# Patient Record
Sex: Male | Born: 1950
Health system: Southern US, Community
[De-identification: ages and names within clinical notes are randomized; demographics above are authoritative.]

## PROBLEM LIST (undated history)

## (undated) DIAGNOSIS — K922 Gastrointestinal hemorrhage, unspecified: Secondary | ICD-10-CM

## (undated) DIAGNOSIS — D126 Benign neoplasm of colon, unspecified: Secondary | ICD-10-CM

## (undated) DIAGNOSIS — K2270 Barrett's esophagus without dysplasia: Secondary | ICD-10-CM

## (undated) DIAGNOSIS — K5790 Diverticulosis of intestine, part unspecified, without perforation or abscess without bleeding: Secondary | ICD-10-CM

## (undated) DIAGNOSIS — K2210 Ulcer of esophagus without bleeding: Secondary | ICD-10-CM

## (undated) DIAGNOSIS — Z9049 Acquired absence of other specified parts of digestive tract: Secondary | ICD-10-CM

## (undated) DIAGNOSIS — K219 Gastro-esophageal reflux disease without esophagitis: Secondary | ICD-10-CM

## (undated) DIAGNOSIS — H269 Unspecified cataract: Secondary | ICD-10-CM

## (undated) DIAGNOSIS — Z5189 Encounter for other specified aftercare: Secondary | ICD-10-CM

## (undated) HISTORY — DX: Gastrointestinal hemorrhage, unspecified: K92.2

## (undated) HISTORY — DX: Gastro-esophageal reflux disease without esophagitis: K21.9

## (undated) HISTORY — DX: Diverticulosis of intestine, part unspecified, without perforation or abscess without bleeding: K57.90

## (undated) HISTORY — DX: Ulcer of esophagus without bleeding: K22.10

## (undated) HISTORY — DX: Barrett's esophagus without dysplasia: K22.70

## (undated) HISTORY — PX: HERNIA REPAIR: SHX51

## (undated) HISTORY — DX: Unspecified cataract: H26.9

## (undated) HISTORY — DX: Encounter for other specified aftercare: Z51.89

## (undated) HISTORY — DX: Acquired absence of other specified parts of digestive tract: Z90.49

## (undated) HISTORY — PX: COLONOSCOPY: SHX174

## (undated) HISTORY — PX: NISSEN FUNDOPLICATION: SHX2091

## (undated) HISTORY — PX: CHOLECYSTECTOMY: SHX55

## (undated) HISTORY — DX: Benign neoplasm of colon, unspecified: D12.6

---

## 1998-05-01 ENCOUNTER — Ambulatory Visit (HOSPITAL_COMMUNITY): Admission: RE | Admit: 1998-05-01 | Discharge: 1998-05-01 | Payer: Self-pay | Admitting: Gastroenterology

## 1999-05-02 ENCOUNTER — Encounter: Admission: RE | Admit: 1999-05-02 | Discharge: 1999-07-31 | Payer: Self-pay | Admitting: Internal Medicine

## 1999-08-06 ENCOUNTER — Encounter: Admission: RE | Admit: 1999-08-06 | Discharge: 1999-11-04 | Payer: Self-pay | Admitting: Internal Medicine

## 2004-10-06 ENCOUNTER — Ambulatory Visit: Payer: Self-pay | Admitting: Family Medicine

## 2005-05-02 ENCOUNTER — Ambulatory Visit: Payer: Self-pay | Admitting: Internal Medicine

## 2005-05-18 ENCOUNTER — Ambulatory Visit: Payer: Self-pay | Admitting: Family Medicine

## 2005-06-17 ENCOUNTER — Ambulatory Visit: Payer: Self-pay | Admitting: Gastroenterology

## 2005-07-16 ENCOUNTER — Ambulatory Visit: Payer: Self-pay | Admitting: Internal Medicine

## 2005-07-19 ENCOUNTER — Ambulatory Visit (HOSPITAL_COMMUNITY): Admission: RE | Admit: 2005-07-19 | Discharge: 2005-07-19 | Payer: Self-pay | Admitting: Surgery

## 2005-07-23 ENCOUNTER — Ambulatory Visit: Payer: Self-pay | Admitting: Internal Medicine

## 2005-08-27 ENCOUNTER — Inpatient Hospital Stay (HOSPITAL_COMMUNITY): Admission: RE | Admit: 2005-08-27 | Discharge: 2005-08-28 | Payer: Self-pay | Admitting: Surgery

## 2005-11-07 ENCOUNTER — Encounter: Admission: RE | Admit: 2005-11-07 | Discharge: 2005-11-07 | Payer: Self-pay | Admitting: Surgery

## 2006-01-24 ENCOUNTER — Ambulatory Visit: Payer: Self-pay | Admitting: Internal Medicine

## 2006-08-03 ENCOUNTER — Encounter: Admission: RE | Admit: 2006-08-03 | Discharge: 2006-08-03 | Payer: Self-pay | Admitting: *Deleted

## 2006-11-25 ENCOUNTER — Ambulatory Visit: Payer: Self-pay | Admitting: Gastroenterology

## 2006-12-09 ENCOUNTER — Ambulatory Visit: Payer: Self-pay | Admitting: Gastroenterology

## 2006-12-09 LAB — HM COLONOSCOPY

## 2007-04-09 ENCOUNTER — Ambulatory Visit: Payer: Self-pay | Admitting: Internal Medicine

## 2007-04-09 LAB — CONVERTED CEMR LAB
Eosinophils Absolute: 0.1 10*3/uL (ref 0.0–0.6)
HCT: 37.3 % — ABNORMAL LOW (ref 39.0–52.0)
MCHC: 34.9 g/dL (ref 30.0–36.0)
MCV: 93.9 fL (ref 78.0–100.0)
Monocytes Absolute: 1 10*3/uL — ABNORMAL HIGH (ref 0.2–0.7)
Monocytes Relative: 9.3 % (ref 3.0–11.0)
Neutro Abs: 5.5 10*3/uL (ref 1.4–7.7)
RBC: 3.98 M/uL — ABNORMAL LOW (ref 4.22–5.81)
RDW: 12.2 % (ref 11.5–14.6)
WBC: 10.5 10*3/uL (ref 4.5–10.5)

## 2007-04-10 ENCOUNTER — Ambulatory Visit: Payer: Self-pay | Admitting: Internal Medicine

## 2007-04-13 ENCOUNTER — Encounter: Payer: Self-pay | Admitting: Internal Medicine

## 2007-04-13 ENCOUNTER — Ambulatory Visit: Payer: Self-pay | Admitting: Internal Medicine

## 2007-04-16 ENCOUNTER — Ambulatory Visit (HOSPITAL_COMMUNITY): Admission: RE | Admit: 2007-04-16 | Discharge: 2007-04-16 | Payer: Self-pay | Admitting: Internal Medicine

## 2007-09-10 DIAGNOSIS — K5730 Diverticulosis of large intestine without perforation or abscess without bleeding: Secondary | ICD-10-CM | POA: Insufficient documentation

## 2007-09-10 DIAGNOSIS — D126 Benign neoplasm of colon, unspecified: Secondary | ICD-10-CM | POA: Insufficient documentation

## 2010-05-29 ENCOUNTER — Telehealth: Payer: Self-pay | Admitting: Internal Medicine

## 2010-06-19 ENCOUNTER — Encounter: Payer: Self-pay | Admitting: Internal Medicine

## 2010-06-19 ENCOUNTER — Ambulatory Visit: Payer: Self-pay | Admitting: Internal Medicine

## 2010-06-19 DIAGNOSIS — K449 Diaphragmatic hernia without obstruction or gangrene: Secondary | ICD-10-CM | POA: Insufficient documentation

## 2010-06-21 ENCOUNTER — Telehealth: Payer: Self-pay | Admitting: Internal Medicine

## 2010-06-21 LAB — CONVERTED CEMR LAB
ALT: 25 units/L (ref 0–53)
AST: 21 units/L (ref 0–37)
Albumin: 4.3 g/dL (ref 3.5–5.2)
Alkaline Phosphatase: 80 units/L (ref 39–117)
Basophils Absolute: 0 10*3/uL (ref 0.0–0.1)
Bilirubin, Direct: 0.1 mg/dL (ref 0.0–0.3)
CO2: 29 meq/L (ref 19–32)
Cholesterol: 186 mg/dL (ref 0–200)
Creatinine, Ser: 1.2 mg/dL (ref 0.4–1.5)
Glucose, Bld: 77 mg/dL (ref 70–99)
HCT: 48.5 % (ref 39.0–52.0)
HDL: 43.9 mg/dL (ref >39.00)
Hemoglobin: 16.6 g/dL (ref 13.0–17.0)
LDL Cholesterol: 118 mg/dL — ABNORMAL HIGH (ref 0–99)
Neutrophils Relative %: 60.1 % (ref 43.0–77.0)
PSA: 0.31 ng/mL (ref 0.10–4.00)
RDW: 13.2 % (ref 11.5–14.6)
TSH: 1.56 microintl units/mL (ref 0.35–5.50)
Total Bilirubin: 0.8 mg/dL (ref 0.3–1.2)
Total Protein: 7.4 g/dL (ref 6.0–8.3)
Triglycerides: 122 mg/dL (ref 0.0–149.0)
WBC: 7.2 10*3/uL (ref 4.5–10.5)

## 2010-07-01 DIAGNOSIS — Z5189 Encounter for other specified aftercare: Secondary | ICD-10-CM

## 2010-07-01 HISTORY — DX: Encounter for other specified aftercare: Z51.89

## 2010-07-31 NOTE — Progress Notes (Signed)
Summary: REQUEST FOR RE-EST / CPX  Phone Note Call from Patient   Caller: Patient    Summary of Call: Pt called to see if he could be re-est with Dr Cato Mulligan (last seen 07/2005) and come in for cpx/labs at time of re-est appt.Marland KitchenMarland KitchenMarland Kitchen?  Can you advise okay for same?  Pt can be reached at [phone].   Initial call taken by: Debbra Riding,  May 29, 2010 4:02 PM  Follow-up for Phone Call        ok no urgency Follow-up by: Birdie Sons MD,  May 30, 2010 10:21 AM  Additional Follow-up for Phone Call Additional follow up Details #1::        Called and left msg for pt to c/b so appt can be scheduled.  Additional Follow-up by: Debbra Riding,  May 31, 2010 8:54 AM    Additional Follow-up for Phone Call Additional follow up Details #2::    Pt c/b and was scheduled for appt on 12/20  at  9am.  Follow-up by: Debbra Riding,  May 31, 2010 9:03 AM

## 2010-08-02 NOTE — Assessment & Plan Note (Signed)
Summary: PT RE-EST (OK PER DR SWORDS) // RS   Vital Signs:  Patient profile:   60 year old male Height:      69 inches Weight:      188 pounds BMI:     27.86 Temp:     98.5 degrees F oral Pulse rate:   84 / minute BP sitting:   112 / 74  (left arm) Cuff size:   large  Vitals Entered By: Alfred Levins, CMA (June 19, 2010 9:17 AM) CC: re-establish   CC:  re-establish.  History of Present Illness: CPX  Current Problems (verified): 1)  Colonic Polyps  (ICD-211.3) 2)  Hiatal Hernia  (ICD-553.3) 3)  Diverticulosis, Colon  (ICD-562.10)  Current Medications (verified): 1)  Omeprazole 40 Mg Cpdr (Omeprazole) .Marland Kitchen.. 1 By Mouth Once Daily  Allergies (verified): No Known Drug Allergies  Past History:  Past Medical History: Last updated: 02/23/2007 Hiatal Hernia GERD  Family History: Last updated: 06/19/2010 Family History Depression Family History Hypertension Family History Lung cancer--mother pancreatitis---father  Social History: Last updated: 02/23/2007 Occupation: Married Never Smoked Alcohol use-yes Drug use-no  Risk Factors: Smoking Status: never (02/23/2007)  Past Surgical History:  nissen fundoplication  Family History: Family History Depression Family History Hypertension Family History Lung cancer--mother pancreatitis---father  Physical Exam  General:  well-developed well-nourished male in no acute distress. HEENT exam atraumatic, normocephalic symmetric her muscles are intact. Neck is supple without lymphadenopathy or thyromegaly. Chest clear to auscultation cardiac exam S1-S2 are regular. Abdominal exam active bowel sounds, soft nontender predictor his is no clubbing cyanosis or edema. Neurologic exam is alert with normal gait   Impression & Recommendations:  Problem # 1:  Preventive Health Care (ICD-V70.0) health maintenance up-to-date. I'll check an EKG and given [place] at the. Orders: Venipuncture (13086) TLB-Lipid Panel  (80061-LIPID) TLB-BMP (Basic Metabolic Panel-BMET) (80048-METABOL) TLB-CBC Platelet - w/Differential (85025-CBCD) TLB-Hepatic/Liver Function Pnl (80076-HEPATIC) TLB-TSH (Thyroid Stimulating Hormone) (84443-TSH) TLB-PSA (Prostate Specific Antigen) (84153-PSA)  Complete Medication List: 1)  Omeprazole 40 Mg Cpdr (Omeprazole) .Marland Kitchen.. 1 by mouth once daily   Orders Added: 1)  New Patient 40-64 years [99386] 2)  Venipuncture [57846] 3)  TLB-Lipid Panel [80061-LIPID] 4)  TLB-BMP (Basic Metabolic Panel-BMET) [80048-METABOL] 5)  TLB-CBC Platelet - w/Differential [85025-CBCD] 6)  TLB-Hepatic/Liver Function Pnl [80076-HEPATIC] 7)  TLB-TSH (Thyroid Stimulating Hormone) [84443-TSH] 8)  TLB-PSA (Prostate Specific Antigen) [96295-MWU]    Preventive Care Screening  Colonoscopy:    Date:  07/01/2004    Next Due:  07/2014    Results:  Diverticulosis     Preventive Care Screening  Colonoscopy:    Date:  07/01/2004    Next Due:  07/2014    Results:  Diverticulosis

## 2010-08-02 NOTE — Progress Notes (Signed)
Summary: referral  Phone Note Call from Patient Call back at Home Phone [phone]   Caller: Patient Call For: swords Summary of Call: pt wants to know what orthopedic you would recommend for his arthritis Initial call taken by: Alfred Levins, CMA,  June 21, 2010 9:27 AM  Follow-up for Phone Call        dr Chapman Fitch ortho Follow-up by: Birdie Sons MD,  June 21, 2010 2:22 PM  Additional Follow-up for Phone Call Additional follow up Details #1::        pt aware Additional Follow-up by: Alfred Levins, CMA,  June 21, 2010 2:56 PM

## 2010-11-13 NOTE — Assessment & Plan Note (Signed)
[facility]                         GASTROENTEROLOGY OFFICE NOTE   RUTHERFORD, ALARIE                   MRN:          [id number]  DATE:04/10/2007                            DOB:          [birth year]    CHIEF COMPLAINT:  Melena, dark stools.   HISTORY:  [name] has been a patient of Dr. Victorino Dike.  He has  had several days of very dark, jet black, navy blue foul-smelling  stools.  That may be abating somewhat.  There is no real abdominal pain.  No light-headedness.  No chest pain.  No dizziness.  He had a  hemoglobin, i.e., CBC, drawn yesterday and his hemoglobin is normal at  13.  He has not really been using any antiinflammatories, and he is  essentially on no medicines at this time.  He has been taking some  Zegerid powder occasionally after his laparoscopic Nissen fundoplication  by Dr. Daphine Deutscher last year.  He does not have classic heartburn any more,  but occasionally has some symptoms associated with what he describes as  acid and some mild epigastric distress.  He is otherwise fairly healthy.  He has had a colonoscopy in the past.  I do not think he has had any  polyps there.  Records are not completely available at the time of the  dictation, though I had them when he was in the office.   MEDICATIONS:  None.   PHYSICAL EXAM:  Weight 184 pounds, pulse 88, blood pressure 110/64.  ABDOMEN:  Soft and nontender.  Bowel sounds present.  HEART:  S1, S2.  No murmurs, rubs, or gallops.  LUNGS:  Clear.  RECTAL:  Shows dark brown stool, which is markedly Hemoccult positive.   ASSESSMENT:  Melena.  Presumably, this is an upper GI bleed though right  colonic bleed is possible.  He did have some diverticulosis on his  colonoscopy.  Otherwise, I think this is most likely an upper GI bleed.   PLAN:  1. Zegerid 40 mg b.i.d.  2. Schedule EGD.  This will be done Monday, 3 days from now.  3. Should he have persistent melena, hematochezia,  light-headedness,      worsening symptoms, he should call the on call physician, or go to      the ER depending upon the situation.  This was explained to the      patient.     Iva Boop, MD,FACG  Electronically Signed   CEG/MedQ  DD: 04/10/2007  DT: 04/11/2007  Job #: [id number]   cc:   Valetta Mole. Swords, MD  Thornton Park Daphine Deutscher, MD

## 2010-11-16 NOTE — Op Note (Signed)
NAME:  [name], [name]            ACCOUNT NO.:  [id number]   MEDICAL RECORD NO.:  [id number]          PATIENT TYPE:  INP   LOCATION:  1603                         FACILITY:  [facility]   PHYSICIAN:  Thornton Park. Daphine Deutscher, MD  DATE OF BIRTH:  07/28/1950   DATE OF PROCEDURE:  08/26/2005  DATE OF DISCHARGE:                                 OPERATIVE REPORT   CCS#:  [id number].   PREOPERATIVE DIAGNOSIS:  Gastroesophageal reflux disease with type 1 sliding  hiatal hernia by upper GI.   POSTOPERATIVE DIAGNOSIS:  Gastroesophageal reflux disease with type 1  sliding hiatal hernia by upper GI with evidence of chronic inflammatory  changes in the gastroesophageal junction.   PROCEDURES:  Laparoscopic Nissen fundoplication over a #56 lighted bougie  with a two suture pledgeted closure of the posterior hiatus and a free  suture wrap using the Endostitch.   SURGEON:  Thornton Park. Daphine Deutscher, MD.   ASSISTANT:  Baruch Merl, MD.   ANESTHESIA:  General endotracheal.   DRAINS:  None.   DISPOSITION:  To PACU and the floor.   DESCRIPTION OF PROCEDURE:  [name] is a 60 year old white male with  above-mentioned diagnosis. He was taken to room 1 and given general  anesthesia. The abdomen was prepped with chlorhexidine and draped sterilely.  The abdomen was entered using an Optiview trocar to the left upper quadrant  and once entering the abdomen it was insufflated without difficulty. The  trocar went in nicely without difficulty either. A 5 mm was placed in the  right side in the upper abdomen and lower than that was at 10/11 and then a  10/11 to the left of the midline at the umbilicus and another 5 lateral and  then a 5 in the upper midline for the [facility] retractor. The Satira Mccallum was  inserted and the liver was retracted.   Dissection began along the gastrohepatic membrane and was incised exposing  the right crus and then carried anteriorly exposing the pharyngoesophageal  ligament which was  taken down. I carried this over to the left crus.   I then started down on the greater curvature and took down short gastric's  with the harmonic scalpel. These were divided and carried up to the left  crus were I completed the dissection and found a significant hiatal hernia  and a lot of inflammatory changes on the left side. It appeared that the  entire GE junction was telescoped above the diaphragm. Once I dissected this  free, I got behind it and put a Penrose drain around the distal  esophagogastric junction.   I then went back to the patient's right side and again dissected free the  esophagogastric junction better. I closed the hiatus with two pledgeted  sutures using the Endostitch bringing it together nicely. Dr. Colin Benton  endoscoped the patient in the meantime, and we verified the esophagogastric  junction and I had plenty of esophagus in the abdomen. I then brought around  the upper portion of the stomach and then with a lighted 56 bougie in place  I did the fundoplication suturing all three sutures getting a purchase  of  esophagus. When completed, the bougie was withdrawn. A healthy wrap was  present. The clips were placed on the upper two most knots for subsequent  endoscopic visualization. There was no bleeding noted. I irrigated and  inspected and it looked good. The abdomen was deflated and the trocars  removed. The wounds were injected with 0.5% Marcaine. The skin was closed  with 4-0 Vicryl subcuticularly and Benzoin and Steri-Strips. The patient  seemed to tolerate the procedure well and was taken to the recovery room  satisfactory condition.      Thornton Park Daphine Deutscher, MD  Electronically Signed     MBM/MEDQ  D:  08/26/2005  T:  08/27/2005  Job:  [id number]   cc:   Valetta Mole. Swords, M.D. [facility]  [address] [place]  [place] [address]   Ulyess Mort, M.D. [facility]  [address]  [place]  [place] [address]

## 2010-11-16 NOTE — Op Note (Signed)
NAME:  HOYTE, ZIEBELL            ACCOUNT NO.:  [id number]   MEDICAL RECORD NO.:  [id number]          PATIENT TYPE:  INP   LOCATION:  1603                         FACILITY:  [facility]   PHYSICIAN:  Alfonse Ras, MD   DATE OF BIRTH:  [birth year]   DATE OF PROCEDURE:  08/26/2005  DATE OF DISCHARGE:                                 OPERATIVE REPORT   PREOPERATIVE DIAGNOSIS:  Gastroesophageal reflux disease.   POSTOPERATIVE DIAGNOSIS:  Gastroesophageal reflux disease.   PROCEDURE:  Intraoperative upper endoscopy.   SURGEON:  Alfonse Ras, M.D.  (The remainder of note will be dictated by Wenda Low, M.D.)   DESCRIPTION OF PROCEDURE:  At completion of the dissection of the hiatal  hernia, the Olympus endoscope was introduced through the oropharynx and down  through the esophagus under direct vision. The EG junction was easily  visualized at 43 cm, and the stomach was entered.  This was below the  diaphragm.  The endoscope was then removed, and the stomach was  decompressed. The remainder of the note will be dictated by Dr. Daphine Deutscher.      Alfonse Ras, MD  Electronically Signed     KRE/MEDQ  D:  08/26/2005  T:  08/27/2005  Job:  [id number]

## 2010-11-16 NOTE — Discharge Summary (Signed)
NAME:  JACK, BOLIO            ACCOUNT NO.:  [id number]   MEDICAL RECORD NO.:  [id number]          PATIENT TYPE:  INP   LOCATION:  1603                         [facility]ACILITY:  [facility]   PHYSICIAN:  Thornton Park. Daphine Deutscher, MD  DATE OF BIRTH:  [birth year]   DATE OF ADMISSION:  08/26/2005  DATE OF DISCHARGE:  08/28/2005                                 DISCHARGE SUMMARY   ADMISSION DIAGNOSES:  Gastroesophageal reflux disease.   DISCHARGE DIAGNOSES:  Gastroesophageal reflux disease, status post  laparoscopic Nissen fundoplication over a #56 lighted bougie with a two  suture pledgeted closure of the hiatus and a three suture wrap.   HOSPITAL COURSE:  Mr. Mizuno was an a.m. admission on August 26, 2005  and underwent the above-mentioned operation.  On postop day #1, he had an  upper GI series, which I reviewed, which showed appropriate repair of the  hernia with narrowing at the site of the wrap with no evidence of  extravasation.  Seemed to get along well and was not having really any pain  to speak of.  At the time of discharge on August 28, 2005, he was given a  prescription for Lortab elixir to take for pain and will be followed up in  the office in 2-3 weeks.   CONDITION:  Good.      Thornton Park Daphine Deutscher, MD  Electronically Signed     MBM/MEDQ  D:  08/28/2005  T:  08/28/2005  Job:  [id number]

## 2011-01-18 ENCOUNTER — Inpatient Hospital Stay (HOSPITAL_COMMUNITY)
Admission: EM | Admit: 2011-01-18 | Discharge: 2011-01-25 | DRG: 170 | Disposition: A | Discharge status: Home / Self Care | Payer: BC Managed Care – PPO | Attending: General Surgery | Admitting: General Surgery

## 2011-01-18 ENCOUNTER — Telehealth: Payer: Self-pay | Admitting: Internal Medicine

## 2011-01-18 ENCOUNTER — Encounter (HOSPITAL_COMMUNITY): Payer: Self-pay | Admitting: Radiology

## 2011-01-18 ENCOUNTER — Inpatient Hospital Stay (INDEPENDENT_AMBULATORY_CARE_PROVIDER_SITE_OTHER)
Admission: RE | Admit: 2011-01-18 | Discharge: 2011-01-18 | Disposition: A | Discharge status: Home / Self Care | Payer: BC Managed Care – PPO | Source: Ambulatory Visit | Attending: Emergency Medicine | Admitting: Emergency Medicine

## 2011-01-18 ENCOUNTER — Emergency Department (HOSPITAL_COMMUNITY): Payer: BC Managed Care – PPO

## 2011-01-18 DIAGNOSIS — R112 Nausea with vomiting, unspecified: Secondary | ICD-10-CM

## 2011-01-18 DIAGNOSIS — K219 Gastro-esophageal reflux disease without esophagitis: Principal | ICD-10-CM | POA: Diagnosis present

## 2011-01-18 DIAGNOSIS — Y836 Removal of other organ (partial) (total) as the cause of abnormal reaction of the patient, or of later complication, without mention of misadventure at the time of the procedure: Secondary | ICD-10-CM | POA: Diagnosis present

## 2011-01-18 DIAGNOSIS — K838 Other specified diseases of biliary tract: Secondary | ICD-10-CM | POA: Diagnosis present

## 2011-01-18 DIAGNOSIS — R1011 Right upper quadrant pain: Secondary | ICD-10-CM

## 2011-01-18 DIAGNOSIS — K8010 Calculus of gallbladder with chronic cholecystitis without obstruction: Secondary | ICD-10-CM | POA: Diagnosis present

## 2011-01-18 DIAGNOSIS — K929 Disease of digestive system, unspecified: Secondary | ICD-10-CM | POA: Diagnosis present

## 2011-01-18 DIAGNOSIS — R10811 Right upper quadrant abdominal tenderness: Secondary | ICD-10-CM

## 2011-01-18 DIAGNOSIS — K8020 Calculus of gallbladder without cholecystitis without obstruction: Secondary | ICD-10-CM

## 2011-01-18 DIAGNOSIS — Y9210 Unspecified residential institution as the place of occurrence of the external cause: Secondary | ICD-10-CM | POA: Diagnosis present

## 2011-01-18 DIAGNOSIS — K8000 Calculus of gallbladder with acute cholecystitis without obstruction: Secondary | ICD-10-CM | POA: Diagnosis present

## 2011-01-18 LAB — URINALYSIS, ROUTINE W REFLEX MICROSCOPIC
Glucose, UA: NEGATIVE mg/dL
Ketones, ur: NEGATIVE mg/dL
Leukocytes, UA: NEGATIVE
Nitrite: NEGATIVE
Protein, ur: NEGATIVE mg/dL

## 2011-01-18 LAB — DIFFERENTIAL
Basophils Absolute: 0.1 10*3/uL (ref 0.0–0.1)
Eosinophils Relative: 1 % (ref 0–5)
Lymphocytes Relative: 17 % (ref 12–46)
Lymphs Abs: 1.9 10*3/uL (ref 0.7–4.0)
Monocytes Relative: 9 % (ref 3–12)

## 2011-01-18 LAB — CBC
Hemoglobin: 16.6 g/dL (ref 13.0–17.0)
MCV: 89.2 fL (ref 78.0–100.0)
RDW: 12.4 % (ref 11.5–15.5)

## 2011-01-18 LAB — COMPREHENSIVE METABOLIC PANEL
ALT: 21 U/L (ref 0–53)
AST: 16 U/L (ref 0–37)
Alkaline Phosphatase: 116 U/L (ref 39–117)
Calcium: 9.2 mg/dL (ref 8.4–10.5)
Creatinine, Ser: 1.02 mg/dL (ref 0.50–1.35)
GFR calc Af Amer: >60 mL/min (ref >60)
GFR calc non Af Amer: >60 mL/min (ref >60)
Sodium: 135 mEq/L (ref 135–145)
Total Bilirubin: 0.4 mg/dL (ref 0.3–1.2)
Total Protein: 7.6 g/dL (ref 6.0–8.3)

## 2011-01-18 MED ORDER — IOHEXOL 300 MG/ML  SOLN
100.0000 mL | Freq: Once | INTRAMUSCULAR | Status: AC | PRN
  Administered 2011-01-18: 100 mL via INTRAVENOUS

## 2011-01-18 NOTE — Telephone Encounter (Signed)
Pt would like to be seen today for abdominal pain. Pt informed there are no appts available today, suggested he contact his PCP for today. Offered pt an appt for Tuesday with Dr. Leone Payor but pt states he would like to switch his care to Dr. Christella Hartigan, his wife sees Dr. Christella Hartigan and they would like to see the same physician. Dr. Leone Payor please let me know if you approve this switch.

## 2011-01-18 NOTE — Telephone Encounter (Signed)
Dr. Christella Hartigan will you accept this pt? He states his wife sees you and he would like to see you asap if you will. Please advise.

## 2011-01-18 NOTE — Telephone Encounter (Signed)
Ok with me 

## 2011-01-19 LAB — CBC
MCH: 32.6 pg (ref 26.0–34.0)
Platelets: 252 10*3/uL (ref 150–400)
WBC: 13.1 10*3/uL — ABNORMAL HIGH (ref 4.0–10.5)

## 2011-01-19 LAB — COMPREHENSIVE METABOLIC PANEL
Alkaline Phosphatase: 108 U/L (ref 39–117)
BUN: 11 mg/dL (ref 6–23)
Calcium: 8.3 mg/dL — ABNORMAL LOW (ref 8.4–10.5)
GFR calc Af Amer: >60 mL/min (ref >60)
GFR calc non Af Amer: >60 mL/min (ref >60)
Potassium: 4.6 mEq/L (ref 3.5–5.1)
Total Bilirubin: 0.9 mg/dL (ref 0.3–1.2)

## 2011-01-20 ENCOUNTER — Other Ambulatory Visit (INDEPENDENT_AMBULATORY_CARE_PROVIDER_SITE_OTHER): Payer: Self-pay | Admitting: Surgery

## 2011-01-20 ENCOUNTER — Observation Stay (HOSPITAL_COMMUNITY): Payer: BC Managed Care – PPO

## 2011-01-20 DIAGNOSIS — K8000 Calculus of gallbladder with acute cholecystitis without obstruction: Secondary | ICD-10-CM

## 2011-01-20 LAB — CBC
MCH: 32.6 pg (ref 26.0–34.0)
MCHC: 35.9 g/dL (ref 30.0–36.0)
MCV: 90.9 fL (ref 78.0–100.0)
Platelets: 238 10*3/uL (ref 150–400)
RBC: 4.6 MIL/uL (ref 4.22–5.81)
WBC: 13.5 10*3/uL — ABNORMAL HIGH (ref 4.0–10.5)

## 2011-01-20 LAB — COMPREHENSIVE METABOLIC PANEL
AST: 36 U/L (ref 0–37)
Albumin: 3 g/dL — ABNORMAL LOW (ref 3.5–5.2)
BUN: 8 mg/dL (ref 6–23)
CO2: 27 mEq/L (ref 19–32)
Chloride: 102 mEq/L (ref 96–112)
Creatinine, Ser: 0.84 mg/dL (ref 0.50–1.35)
Glucose, Bld: 118 mg/dL — ABNORMAL HIGH (ref 70–99)
Total Bilirubin: 0.8 mg/dL (ref 0.3–1.2)
Total Protein: 6.4 g/dL (ref 6.0–8.3)

## 2011-01-20 LAB — MRSA PCR SCREENING: MRSA by PCR: NEGATIVE

## 2011-01-20 NOTE — Telephone Encounter (Signed)
Happy to see him at my next available NGI appt.

## 2011-01-21 LAB — COMPREHENSIVE METABOLIC PANEL
AST: 31 U/L (ref 0–37)
BUN: 8 mg/dL (ref 6–23)
CO2: 26 mEq/L (ref 19–32)
Calcium: 8.5 mg/dL (ref 8.4–10.5)
Creatinine, Ser: 0.8 mg/dL (ref 0.50–1.35)
GFR calc non Af Amer: >60 mL/min (ref >60)

## 2011-01-21 LAB — CBC
MCH: 31.9 pg (ref 26.0–34.0)
MCV: 90.6 fL (ref 78.0–100.0)
Platelets: 253 10*3/uL (ref 150–400)
RBC: 4.26 MIL/uL (ref 4.22–5.81)
RDW: 12.5 % (ref 11.5–15.5)

## 2011-01-21 NOTE — Telephone Encounter (Signed)
Appt made for pt to see Dr. Sebastian Ache 02/22/11@3 :30pm.

## 2011-01-22 NOTE — H&P (Signed)
  [name], [name] NO.:  [id number]  MEDICAL RECORD NO.:  [id number]  LOCATION:  5034                         FACILITY:  [facility]  PHYSICIAN:  Wilmon Arms. Corliss Skains, M.D. DATE OF BIRTH:  Jul 28, 1950  DATE OF ADMISSION:  01/18/2011 DATE OF DISCHARGE:                             HISTORY & PHYSICAL   CHIEF COMPLAINT:  Right upper quadrant abdominal pain.  HISTORY OF PRESENT ILLNESS:  This is a 60 year old male with a history of hiatal hernia, status post previous laparoscopic Nissen fundoplication by Dr. Luretha Murphy in 2007, who presents with several episodes of epigastric abdominal pain over the last week.  He states that these episodes tend to happen at night.  He has a sharp pain in his epigastrium wrapping around to his back.  He does have some nausea associated with this, but no vomiting.  He denies any fever.  Initially, the symptoms resolved quickly, but over the last couple of nights, the symptoms have lasted much longer have become fairly severe.  He does relate the first episode to eating pork chops.  He denies any change in his bowel habits.  PAST MEDICAL HISTORY:  Gastroesophageal reflux disease and history of hiatal hernia.  PAST SURGICAL HISTORY:  Laparoscopic Nissen fundoplication in 2007 Dr. Luretha Murphy.  FAMILY HISTORY:  His father is deceased from pancreatitis.  SOCIAL HISTORY:  Nonsmoker, nondrinker.  REVIEW OF SYSTEMS:  Otherwise, negative.  CURRENT MEDICATIONS:  The patient takes Prilosec 40 mg daily.  ALLERGIES:  None.  PHYSICAL EXAMINATION:  VITAL SIGNS:  Temperature 97.4, heart rate 87, blood pressure 129/88, respirations 18, saturation 97% on room air. GENERAL:  This is a well-developed, well-nourished male, who appears mildly uncomfortable secondary to nausea.  He has just received some pain medication. HEENT:  EOMI.  Sclerae anicteric. NECK:  No masses, no thyromegaly. LUNGS:  Clear, normal respiratory effort. HEART:   Regular rate and rhythm, no murmur. ABDOMEN:  Positive bowel sounds, soft, tender in his epigastrium and right upper quadrant wrapped around in his back.  No palpable masses. He has a small reducible umbilical hernia. EXTREMITIES:  No edema. SKIN:  Warm, dry.  No sign of jaundice.  LABORATORY DATA:  White count 11.5, hemoglobin 16.6, platelet count 277. Electrolytes within normal limits.  Total bilirubin 0.4.  AST and ALT are normal.  Lipase 34.  Urinalysis is negative.  CT scan the abdomen and pelvis shows cholelithiasis with possible wall thickening, moderate- sized hiatal hernia, small right inguinal hernia containing only fat and a small umbilical hernia containing only fat.  Right upper quadrant ultrasound showed multiple 1-cm gallstones with some mild gallbladder thickening.  No pericholecystic fluid.  A normal- appearing common bile duct.  IMPRESSION:  Acute cholecystitis with no sign of choledocholithiasis or pancreatitis.  PLAN:  We will admit the patient for IV hydration, bowel rest, and IV antibiotics.  We will plan laparoscopic cholecystectomy with intraoperative cholangiogram in this admission.     Wilmon Arms. Corliss Skains, M.D.     MKT/MEDQ  D:  01/18/2011  T:  01/19/2011  Job:  [id number]  Electronically Signed by Manus Rudd M.D. on 01/22/2011 08:10:48 AM

## 2011-01-23 ENCOUNTER — Inpatient Hospital Stay (HOSPITAL_COMMUNITY): Payer: BC Managed Care – PPO

## 2011-01-23 LAB — COMPREHENSIVE METABOLIC PANEL
AST: 24 U/L (ref 0–37)
Albumin: 2.9 g/dL — ABNORMAL LOW (ref 3.5–5.2)
Calcium: 9 mg/dL (ref 8.4–10.5)
Creatinine, Ser: 0.93 mg/dL (ref 0.50–1.35)
Sodium: 136 mEq/L (ref 135–145)
Total Protein: 6.6 g/dL (ref 6.0–8.3)

## 2011-01-23 LAB — DIFFERENTIAL
Basophils Absolute: 0.1 10*3/uL (ref 0.0–0.1)
Lymphocytes Relative: 25 % (ref 12–46)
Monocytes Absolute: 1 10*3/uL (ref 0.1–1.0)
Neutro Abs: 5.3 10*3/uL (ref 1.7–7.7)
Neutrophils Relative %: 59 % (ref 43–77)

## 2011-01-23 LAB — CBC
HCT: 41.3 % (ref 39.0–52.0)
Hemoglobin: 14.7 g/dL (ref 13.0–17.0)
MCHC: 35.6 g/dL (ref 30.0–36.0)
RBC: 4.59 MIL/uL (ref 4.22–5.81)
WBC: 8.9 10*3/uL (ref 4.0–10.5)

## 2011-01-23 NOTE — Telephone Encounter (Signed)
Letter mailed to the pt with appt date and time.  

## 2011-01-24 DIAGNOSIS — R1011 Right upper quadrant pain: Secondary | ICD-10-CM

## 2011-01-24 DIAGNOSIS — K832 Perforation of bile duct: Secondary | ICD-10-CM

## 2011-01-25 LAB — CBC
HCT: 44.3 % (ref 39.0–52.0)
Hemoglobin: 15.9 g/dL (ref 13.0–17.0)
MCH: 31.8 pg (ref 26.0–34.0)
MCV: 88.6 fL (ref 78.0–100.0)
RBC: 5 MIL/uL (ref 4.22–5.81)

## 2011-01-25 LAB — COMPREHENSIVE METABOLIC PANEL
ALT: 83 U/L — ABNORMAL HIGH (ref 0–53)
BUN: 8 mg/dL (ref 6–23)
CO2: 22 mEq/L (ref 19–32)
Calcium: 9.1 mg/dL (ref 8.4–10.5)
GFR calc Af Amer: >60 mL/min (ref >60)
GFR calc non Af Amer: >60 mL/min (ref >60)
Glucose, Bld: 93 mg/dL (ref 70–99)
Sodium: 136 mEq/L (ref 135–145)

## 2011-01-30 ENCOUNTER — Encounter (INDEPENDENT_AMBULATORY_CARE_PROVIDER_SITE_OTHER): Payer: Self-pay | Admitting: General Surgery

## 2011-01-31 ENCOUNTER — Telehealth: Payer: Self-pay

## 2011-01-31 DIAGNOSIS — T85590A Other mechanical complication of bile duct prosthesis, initial encounter: Secondary | ICD-10-CM

## 2011-01-31 NOTE — Telephone Encounter (Signed)
Patient requests to remain a patient of Dr Russella Dar  There is an earlier phone note where the patient had called and asked to change to Dr Christella Hartigan from Dr Leone Payor, the patient was then admitted to the hospital and had a procedure with Dr Russella Dar he has asked to remain his patient and he never saw Dr Christella Hartigan.     Per Dr Russella Dar patient to be scheduled for ERCP with stent removal.  Patient is scheduled for 03/06/11 12:30 at [facility]  Unable to schedule with MAC sedation, per Dr Russella Dar ok for conscious sedation. I have left a message for the patient to call back to review procedure date and time.  Patient will need a KUB to confirm stent placement 1 week prior to ERCP

## 2011-01-31 NOTE — Op Note (Signed)
NAME:  ORIE, BAXENDALE            ACCOUNT NO.:  [id number]  MEDICAL RECORD NO.:  [id number]  LOCATION:                                 FACILITY:  PHYSICIAN:  Thornton Park. Daphine Deutscher, MD  DATE OF BIRTH:  01/25/1951  DATE OF PROCEDURE:  01/20/2011 DATE OF DISCHARGE:                              OPERATIVE REPORT   PREOPERATIVE DIAGNOSIS:  Acute cholecystitis.  POSTOPERATIVE DIAGNOSIS:  Acute and subacute and chronic cholecystitis, cholelithiasis.  PROCEDURE:  Laparoscopic cholecystectomy, attempted intraoperative cholangiogram.  SURGEON:  Thornton Park. Daphine Deutscher, MD  ASSISTANTS: 1. Lodema Pilot, MD 2. Wilmon Arms. Tsuei, MD  DESCRIPTION OF PROCEDURE:  On Sunday, January 20, 2011, we took Taite Schoeppner to the OR 17 at [facility] side [facility].  He came in late Friday evening with acute cholecystitis and he was given IV antibiotics in an attempt to cool him off a bit.  He was taken to room 17, given general anesthesia.  The abdomen was prepped with chlorhexidine and draped sterilely.  Access to the abdomen was achieved through the right upper quadrant using a 5-mm 0-degrees OptiVu without difficulty.  The abdomen was insufflated and another port was placed to the left of the umbilicus again using one of the old other trocar incisions.  I put the camera down there and looked with the other trocar, we lifted up the left lateral side and we could see where the Nissen fundoplication had been done, but I could not see the diaphragm well but I could not see any hernias that were obvious.  However, his gallbladder was well walled off and it was certainly longer than a 66-day-old gallbladder.  I went ahead and was able to free up the fundus and then grasp it and then I decompressed it.  It contained some white bile.  I then stripped away all the adhesions, carrying this down to what appeared to be an obstructed infundibulum.  I could feel a stone and that I milked it back up into the main  gallbladder.  I had a tedious two-hour dissection to try to identify the cholecystocolic common duct junction.  I found the funnel taper and got around the structure and visualized a vessel. There was a very large prominent vessel that went up over the course of the gallbladder on the medial side, we went all the way up near the fundus that was quite large.  I went ahead and attempted to do a cholangiogram and began filling this distal structure that I was around and the contrast would not flow into the common duct.  I felt this was the funnel of the gallbladder.  After I went up and stripped this away and could see the vessels, I felt that I had a critical view of sorts although the anatomy was very inflamed and thick walled.  I went ahead used an Endo-GIA blue load and stapled across the distal gallbladder where I had gotten around it and then I took the gallbladder out from bottom up using clips on the vessel posteriorly that I had previously identified as a vessel and worked my way up with hook electrocautery. As it went up, it was apparent the gallbladder was very  thick walled. Eventually, we detached it and put in a bag and brought it out through the umbilicus in pieces.  This was not the umbilicus but the 12-mm trocar obliquely placed in the upper midline.  I went down and reinspected the gallbladder bed.  No active bleeding or bile leaks were noted.  I went ahead and placed a 19 Blake drain in the gallbladder bed, brought it out through the lateral 5-mm port.  I injected all the port sites with 0.25% Marcaine.  We visualized once again and there were no injuries noted to any other surrounding viscera.  The abdomen was deflated and the ports were withdrawn.  The skin incisions were closed with 4-0 Vicryl with Benzoin and Steri-Strips.  The patient tolerated the procedure well and was taken to recovery room in satisfactory condition.     Thornton Park Daphine Deutscher, MD     MBM/MEDQ   D:  01/20/2011  T:  01/21/2011  Job:  [id number]  Electronically Signed by Luretha Murphy MD on 01/31/2011 07:25:22 AM

## 2011-02-01 ENCOUNTER — Encounter (INDEPENDENT_AMBULATORY_CARE_PROVIDER_SITE_OTHER): Payer: Self-pay | Admitting: Surgery

## 2011-02-01 ENCOUNTER — Ambulatory Visit (INDEPENDENT_AMBULATORY_CARE_PROVIDER_SITE_OTHER): Payer: BC Managed Care – PPO | Admitting: Surgery

## 2011-02-01 VITALS — BP 112/72 | HR 70 | Ht 70.0 in | Wt 180.4 lb

## 2011-02-01 DIAGNOSIS — K838 Other specified diseases of biliary tract: Secondary | ICD-10-CM

## 2011-02-01 DIAGNOSIS — K929 Disease of digestive system, unspecified: Secondary | ICD-10-CM

## 2011-02-01 DIAGNOSIS — K449 Diaphragmatic hernia without obstruction or gangrene: Secondary | ICD-10-CM

## 2011-02-01 MED ORDER — PANTOPRAZOLE SODIUM 20 MG PO TBEC: 20.0000 mg | DELAYED_RELEASE_TABLET | Freq: Every day | ORAL | Status: DC

## 2011-02-01 NOTE — Telephone Encounter (Signed)
Patient advised of ERCP appt and instructions he is also advised to come in for KUB the week of 8/27-8/31.  He will call back for any further questions or concerns

## 2011-02-01 NOTE — Progress Notes (Signed)
Mr. Maniaci is one week out from his discharge at [facility] where he underwent a rotten cholecystectomy on Sunday, July 22 complicated by a cystic duct leak that was treated by ERCP stenting by Claudette Head.  His JP drainage has all but quit except for his irrigation.  I removed the drain.  Rx for Protonix 20 mg QD given for his recurrent GER symptoms.  Will see him back in 3 weeks

## 2011-02-01 NOTE — Telephone Encounter (Signed)
Left message for patient to call back  

## 2011-02-01 NOTE — Patient Instructions (Signed)
Redress the drain site as needed with Neosporin.   Call for increasing right upper quadrant pain.

## 2011-02-04 NOTE — Discharge Summary (Signed)
NAMEABHIJOT, [name] NO.:  [id number]  MEDICAL RECORD NO.:  [id number]  LOCATION:  5034                         FACILITY:  [facility]  PHYSICIAN:  Gabrielle Dare. Janee Morn, M.D.DATE OF BIRTH:  10/08/1950  DATE OF ADMISSION:  01/18/2011 DATE OF DISCHARGE:  01/25/2011                              DISCHARGE SUMMARY   ADMISSION DIAGNOSES:  Acute cholecystitis, cholelithiasis, no choledocholithiasis or pancreatitis.  DISCHARGING DIAGNOSES: 1. Acute cholecystitis, cholelithiasis. 2. Postoperative bile leak after cholecystectomy. 3. History of hiatal hernia with residual moderate-sized hernia status     post Nissen fundoplication, Dr. Luretha Murphy in 2007. 4. Gastroesophageal reflux disease.  PROCEDURES: 1. CT of the abdomen and pelvis, July 2012 shows cholelithiasis and     possible changes, acute cholecystitis. 2. Abdominal ultrasound shows multiple small gallstones up to 1 cm in     size.  The gallbladder wall thickened at 5 mm. 3. Laparoscopic cholecystectomy and intraoperative cholangiogram, January 20, 2011. 4. ERCP with stent placement, January 23, 2011, Dr. Claudette Head.  BRIEF HISTORY:  The patient is a 60 year old gentleman with history of hiatal hernia and prior Nissen laparoscopic fundoplication by Dr. Daphine [name] in 2007.  He has had several episodes of abdominal pain over the last week.  It happens mostly at night.  He describes it as a sharp, wrapping around his back.  He underwent evaluation in the ER at [facility] where his white count was 11,500, hemoglobin was 16.6. Electrolytes and LFTs were normal.  CT showed cholelithiasis with possible thickening and a moderate-sized hiatal hernia along with a small right inguinal hernia containing fat only, and a small umbilical hernia containing fat only.  He was seen by Dr. Manus Rudd and was admitted for IV hydration, bowel rest, and IV antibiotics.  HOSPITAL COURSE:  The patient was admitted, placed  on IV antibiotics, and bowel rest.  He showed some improvement.  He requested Dr. Luretha Murphy to do the surgery.  Dr. Daphine [name] was on the weekend and he was taken to the OR on January 20, 2011 and underwent laparoscopic cholecystectomy.  On the first postoperative morning, he had some pain which was his primary issue along with.  Now, he has been having a good deal of drainage through his JP, it is 250, it was recorded the day before total of 290 overnight.  He was seen by Dr. Daphine [name], who was concerned that there might be a bile leak.  On July 24, second postoperative day, he had a total of 365 mL through his drain, yesterday another 125 and midnight 6 a.m. shift.  He was seen by Dr. Daphine [name] and his opinion is that we need to watch him closely and recheck his labs in the morning.  The third morning, he had had a better night, but he had a dark bile in his JP and Dr. Daphine [name] contacted Dr. Rob Bunting, GI Service for ERCP.  He was subsequently seen by the GI service and taken to the endoscopy lab on January 23, 2011, at which time, he have a cholangiogram, which showed a blush of dye from the cystic duct remnant. This was consistent with a bile leak.  Dr.  Russella Dar then placed an 8.5- [place] biliary stent with good return of bile.  The pancreatic duct was not cannulized and the patient had a normal appearing ampule.  The patient was then transferred to the floor.  Over the next 48 hours, he has made good progress.  This morning, July 27, he has only had 50 mL from his JP over a 24-hour period.  His I's and O's are better.  He is having bowel movement.  Abdomen is soft and the patient felt he was ready for discharge.  His incisions looked good.  We discussed this with Dr. Daphine [name].  It was his opinion the patient go home. We scheduled him to come back next week and have his drain removed.  Dr. Ardell Isaacs note recommends removal of the biliary stent in 6-8 weeks, so we will set up an appointment with Dr.  Russella Dar also.  DISCHARGE MEDS:  Let him resume his preadmission medicines as before and give him Percocet one to two p.o. q.4 h. p.r.n. for pain.  CONDITION ON DISCHARGE:  Improving.     Eber Hong, P.A.   ______________________________ Gabrielle Dare. Janee Morn, M.D.    WDJ/MEDQ  D:  01/25/2011  T:  01/25/2011  Job:  [id number]  cc:   Lodema Pilot, MD Venita Lick. Russella Dar, MD, [facility]  Electronically Signed by Sherrie George P.A. on 01/29/2011 03:05:01 PM Electronically Signed by Violeta Gelinas M.D. on 02/04/2011 08:22:13 PM

## 2011-02-20 ENCOUNTER — Encounter (INDEPENDENT_AMBULATORY_CARE_PROVIDER_SITE_OTHER): Payer: Self-pay | Admitting: Surgery

## 2011-02-20 ENCOUNTER — Ambulatory Visit (INDEPENDENT_AMBULATORY_CARE_PROVIDER_SITE_OTHER): Payer: BC Managed Care – PPO | Admitting: Surgery

## 2011-02-20 VITALS — BP 142/84 | HR 68 | Temp 98.0°F | Ht 70.0 in | Wt 182.0 lb

## 2011-02-20 DIAGNOSIS — Z9049 Acquired absence of other specified parts of digestive tract: Secondary | ICD-10-CM

## 2011-02-20 DIAGNOSIS — Z9889 Other specified postprocedural states: Secondary | ICD-10-CM

## 2011-02-20 NOTE — Progress Notes (Signed)
Followup visit mainly to answer questions. [name] is going to have his stent removed in September. He is doing very well as not had any problems or alternatives very fibrotic and nasty cholecystectomy.  Donald Petit will take out his stent in September and I will see him in the office in followup in October.

## 2011-02-22 ENCOUNTER — Ambulatory Visit: Payer: BC Managed Care – PPO | Admitting: Gastroenterology

## 2011-02-25 ENCOUNTER — Ambulatory Visit: Payer: BC Managed Care – PPO | Admitting: Gastroenterology

## 2011-03-01 ENCOUNTER — Ambulatory Visit (INDEPENDENT_AMBULATORY_CARE_PROVIDER_SITE_OTHER)
Admission: RE | Admit: 2011-03-01 | Discharge: 2011-03-01 | Disposition: A | Discharge status: Home / Self Care | Payer: BC Managed Care – PPO | Source: Ambulatory Visit | Attending: Gastroenterology | Admitting: Gastroenterology

## 2011-03-01 ENCOUNTER — Telehealth: Payer: Self-pay | Admitting: Gastroenterology

## 2011-03-01 DIAGNOSIS — T85590A Other mechanical complication of bile duct prosthesis, initial encounter: Secondary | ICD-10-CM

## 2011-03-01 DIAGNOSIS — K831 Obstruction of bile duct: Secondary | ICD-10-CM

## 2011-03-01 NOTE — Telephone Encounter (Signed)
Called patient and left a message informing him again that the biliary stent is still in stable position and it needs to be removed on 03/06/11 during his ERCP. Told him to keep his appt for 03/06/11 and to call back if there are any more questions.

## 2011-03-06 ENCOUNTER — Other Ambulatory Visit: Payer: BC Managed Care – PPO | Admitting: Gastroenterology

## 2011-03-06 ENCOUNTER — Ambulatory Visit (HOSPITAL_COMMUNITY): Payer: BC Managed Care – PPO

## 2011-03-06 ENCOUNTER — Ambulatory Visit (HOSPITAL_COMMUNITY)
Admission: RE | Admit: 2011-03-06 | Discharge: 2011-03-06 | Disposition: A | Discharge status: Home / Self Care | Payer: BC Managed Care – PPO | Source: Ambulatory Visit | Attending: Gastroenterology | Admitting: Gastroenterology

## 2011-03-06 DIAGNOSIS — K449 Diaphragmatic hernia without obstruction or gangrene: Secondary | ICD-10-CM | POA: Insufficient documentation

## 2011-03-06 DIAGNOSIS — Z79899 Other long term (current) drug therapy: Secondary | ICD-10-CM | POA: Insufficient documentation

## 2011-03-06 DIAGNOSIS — K832 Perforation of bile duct: Secondary | ICD-10-CM

## 2011-03-06 DIAGNOSIS — Z9089 Acquired absence of other organs: Secondary | ICD-10-CM | POA: Insufficient documentation

## 2011-03-06 DIAGNOSIS — Z4659 Encounter for fitting and adjustment of other gastrointestinal appliance and device: Secondary | ICD-10-CM | POA: Insufficient documentation

## 2011-03-06 DIAGNOSIS — K219 Gastro-esophageal reflux disease without esophagitis: Secondary | ICD-10-CM | POA: Insufficient documentation

## 2011-03-07 ENCOUNTER — Ambulatory Visit (HOSPITAL_COMMUNITY): Payer: BC Managed Care – PPO

## 2011-03-07 ENCOUNTER — Telehealth: Payer: Self-pay

## 2011-03-07 NOTE — Telephone Encounter (Signed)
Message copied by Annett Fabian on Thu Mar 07, 2011 11:14 AM ------      Message from: Claudette Head T      Created: Wed Mar 06, 2011  2:05 PM       Will manage as if polyp was adenomatous so please enter a recall colon for 11/2011 and please notify the patient about the recall date.                  ----- Message -----         From: Rossie Muskrat, RN,CGRN         Sent: 03/06/2011   1:55 PM           To: Meryl Dare, MD,FACG            I spoke with path they have no specimens from this date or any path with colon path on it.

## 2011-03-07 NOTE — Telephone Encounter (Signed)
I have left a message for the patient with recall information

## 2011-04-18 ENCOUNTER — Encounter (INDEPENDENT_AMBULATORY_CARE_PROVIDER_SITE_OTHER): Payer: BC Managed Care – PPO | Admitting: Surgery

## 2011-06-07 ENCOUNTER — Ambulatory Visit (INDEPENDENT_AMBULATORY_CARE_PROVIDER_SITE_OTHER): Payer: BC Managed Care – PPO | Admitting: Surgery

## 2011-06-07 ENCOUNTER — Encounter (INDEPENDENT_AMBULATORY_CARE_PROVIDER_SITE_OTHER): Payer: Self-pay | Admitting: Surgery

## 2011-06-07 VITALS — BP 118/82 | HR 64 | Temp 97.4°F | Resp 16 | Ht 69.0 in | Wt 190.0 lb

## 2011-06-07 DIAGNOSIS — Z9889 Other specified postprocedural states: Secondary | ICD-10-CM

## 2011-06-07 DIAGNOSIS — Z9049 Acquired absence of other specified parts of digestive tract: Secondary | ICD-10-CM

## 2011-06-07 HISTORY — DX: Acquired absence of other specified parts of digestive tract: Z90.49

## 2011-06-07 NOTE — Progress Notes (Signed)
[name] comes in today in followup after his cholecystectomy and ERCP stent placement for a cystic duct stump leak. He is doing very well. His stent is out. His GERD symptoms aren't better also. I would be glad to see him again whenever needed.

## 2011-06-07 NOTE — Patient Instructions (Signed)
Diet ad lib

## 2011-10-04 ENCOUNTER — Encounter (HOSPITAL_COMMUNITY): Payer: Self-pay

## 2011-10-04 ENCOUNTER — Emergency Department (HOSPITAL_COMMUNITY)
Admission: EM | Admit: 2011-10-04 | Discharge: 2011-10-04 | Disposition: A | Discharge status: Home / Self Care | Payer: BC Managed Care – PPO | Attending: Emergency Medicine | Admitting: Emergency Medicine

## 2011-10-04 ENCOUNTER — Emergency Department (HOSPITAL_COMMUNITY): Payer: BC Managed Care – PPO

## 2011-10-04 ENCOUNTER — Emergency Department (HOSPITAL_COMMUNITY)
Admission: EM | Admit: 2011-10-04 | Discharge: 2011-10-04 | Disposition: A | Discharge status: Other Acute Inpatient Hospital | Payer: BC Managed Care – PPO | Source: Home / Self Care | Attending: Family Medicine | Admitting: Family Medicine

## 2011-10-04 DIAGNOSIS — K625 Hemorrhage of anus and rectum: Secondary | ICD-10-CM | POA: Insufficient documentation

## 2011-10-04 DIAGNOSIS — R42 Dizziness and giddiness: Secondary | ICD-10-CM | POA: Insufficient documentation

## 2011-10-04 DIAGNOSIS — K922 Gastrointestinal hemorrhage, unspecified: Secondary | ICD-10-CM

## 2011-10-04 DIAGNOSIS — Z79899 Other long term (current) drug therapy: Secondary | ICD-10-CM | POA: Insufficient documentation

## 2011-10-04 DIAGNOSIS — M545 Low back pain, unspecified: Secondary | ICD-10-CM | POA: Insufficient documentation

## 2011-10-04 DIAGNOSIS — K5730 Diverticulosis of large intestine without perforation or abscess without bleeding: Secondary | ICD-10-CM | POA: Insufficient documentation

## 2011-10-04 DIAGNOSIS — K5790 Diverticulosis of intestine, part unspecified, without perforation or abscess without bleeding: Secondary | ICD-10-CM

## 2011-10-04 DIAGNOSIS — K219 Gastro-esophageal reflux disease without esophagitis: Secondary | ICD-10-CM | POA: Insufficient documentation

## 2011-10-04 DIAGNOSIS — K5900 Constipation, unspecified: Secondary | ICD-10-CM | POA: Insufficient documentation

## 2011-10-04 LAB — POCT I-STAT, CHEM 8
Calcium, Ion: 1.2 mmol/L (ref 1.12–1.32)
Chloride: 108 mEq/L (ref 96–112)
HCT: 47 % (ref 39.0–52.0)
TCO2: 25 mmol/L (ref 0–100)

## 2011-10-04 MED ORDER — IOHEXOL 300 MG/ML  SOLN
100.0000 mL | Freq: Once | INTRAMUSCULAR | Status: AC | PRN
  Administered 2011-10-04: 100 mL via INTRAVENOUS

## 2011-10-04 MED ORDER — DOCUSATE SODIUM 100 MG PO CAPS
100.0000 mg | ORAL_CAPSULE | Freq: Two times a day (BID) | ORAL | Status: DC
Start: 2011-10-04 — End: 2011-10-10

## 2011-10-04 MED ORDER — SODIUM CHLORIDE 0.9 % IJ SOLN
INTRAMUSCULAR | Status: AC
  Filled 2011-10-04: qty 3

## 2011-10-04 MED ORDER — IOHEXOL 300 MG/ML  SOLN
20.0000 mL | INTRAMUSCULAR | Status: AC
Start: 2011-10-04 — End: 2011-10-04
  Administered 2011-10-04: 20 mL via ORAL

## 2011-10-04 MED ORDER — POLYETHYLENE GLYCOL 3350 17 GM/SCOOP PO POWD: 17.0000 g | Freq: Every day | ORAL | Status: AC

## 2011-10-04 NOTE — ED Notes (Signed)
PT back from CT

## 2011-10-04 NOTE — ED Notes (Signed)
CT called.  Pt is finished with contrast.

## 2011-10-04 NOTE — Discharge Instructions (Signed)
Take your Protonix everyday.  Follow up with Dr. Russella Dar your GI doctor for an EGD scope and a colonoscopy. Rectal Bleeding  Rectal bleeding is when blood comes out of the opening of the butt (anus). Rectal bleeding may show up as bright red blood or really dark poop (stool). The poop may look dark red, maroon, or black. Rectal bleeding is often a sign that something is wrong. This needs to be checked by a doctor.  HOME CARE  Eat a diet high in fiber. This will help keep your poop soft.   Limit activitiy.   Drink enough fluids to keep your pee (urine) clear or pale yellow.   Take a warm bath to soothe any pain.   Follow up with your doctor as told.  GET HELP RIGHT AWAY IF:  You have more bleeding.   You have black or dark red poop.   You throw up (vomit) blood or it looks like coffee grounds.   You have belly (abdominal) pain or tenderness.   You have a fever.   You feel weak, sick to your stomach (nauseous), or you pass out (faint).   You have pain that is so bad you cannot poop (bowel movement).  MAKE SURE YOU:  Understand these instructions.   Will watch your condition.   Will get help right away if you are not doing well or get worse.  Document Released: 02/27/2011 Document Revised: 06/06/2011 Document Reviewed: 02/27/2011 [facility] Patient Information 2012 [place], [place].

## 2011-10-04 NOTE — Discharge Instructions (Signed)
Transferred to ED for further evaluation of melena.

## 2011-10-04 NOTE — ED Notes (Signed)
Patient presents stating he has been having blood in his stools for the past 3 days along with abd cramping.

## 2011-10-04 NOTE — ED Notes (Signed)
Pt to CT

## 2011-10-04 NOTE — ED Provider Notes (Signed)
History     CSN: [id number]  Arrival date & time 10/04/11  1544   First MD Initiated Contact with Patient 10/04/11 1609      Chief Complaint  Patient presents with  . Melena    (Consider location/radiation/quality/duration/timing/severity/associated sxs/prior treatment) HPI Comments: [name] presents for evaluation of 3 days of dark tarry stools. He reports some mild right lower cord and abdominal discomfort, but no true pain. He is eating and drinking appropriately he thinks. He denies any fever, nausea, or vomiting. He reports that he had his gallbladder removed last year. He also has had a Nissen for hiatal hernia and reflux. He also reports feeling a little dizzy as well.   Patient is a 61 y.o. male presenting with GI illlness. The history is provided by the patient.  GI Problem  This is a new problem. The current episode started more than 2 days ago. The problem occurs 2 to 4 times per day. The problem has not changed since onset.The stool consistency is described as bloody. There has been no fever. Pertinent negatives include no abdominal pain, no vomiting and no chills.    History reviewed. No pertinent past medical history.  Past Surgical History  Procedure Date  . Cholecystectomy   . Nissen fundoplication     No family history on file.  History  Substance Use Topics  . Smoking status: Never Smoker   . Smokeless tobacco: Never Used  . Alcohol Use: No      Review of Systems  Constitutional: Negative.  Negative for chills.  HENT: Negative.   Eyes: Negative.   Respiratory: Negative.   Cardiovascular: Negative.   Gastrointestinal: Positive for constipation and blood in stool. Negative for nausea, vomiting, abdominal pain, diarrhea and rectal pain.  Genitourinary: Negative.   Musculoskeletal: Negative.   Skin: Negative.   Neurological: Positive for dizziness.    Allergies  Review of patient's allergies indicates no known allergies.  Home Medications    Current Outpatient Rx  Name Route Sig Dispense Refill  . PANTOPRAZOLE SODIUM 20 MG PO TBEC Oral Take 1 tablet (20 mg total) by mouth daily. 60 tablet 3    BP 116/79  Pulse 122  Temp(Src) 97.9 F (36.6 C) (Oral)  Resp 16  SpO2 98%  Physical Exam  Nursing note and vitals reviewed. Constitutional: He is oriented to person, place, and time. He appears well-developed and well-nourished.  HENT:  Head: Normocephalic and atraumatic.  Eyes: EOM are normal.  Neck: Normal range of motion.  Pulmonary/Chest: Effort normal.  Abdominal: Soft. Normal appearance and bowel sounds are normal. There is no tenderness.       Several healed scars from laparoscopic surgery  Genitourinary: Prostate normal. Rectal exam shows no external hemorrhoid, no internal hemorrhoid, no fissure, no mass, no tenderness and anal tone normal. Guaiac positive stool.  Musculoskeletal: Normal range of motion.  Neurological: He is alert and oriented to person, place, and time.  Skin: Skin is warm and dry.  Psychiatric: His behavior is normal.    ED Course  Procedures (including critical care time)  Labs Reviewed  POCT I-STAT, CHEM 8 - Abnormal; Notable for the following:    BUN 44 (*)    All other components within normal limits   No results found.   1. GI bleed       MDM  Transferred to Emergency Department for further evaluation of GI bleed        Renaee Munda, MD 10/04/11 1723

## 2011-10-04 NOTE — ED Notes (Signed)
Pt c/o dark stools for past 3 days.  Pt states today is darkest yet.  Pt states he has had approx 5 BMs today.  Pt denies HX of GI issues.

## 2011-10-04 NOTE — ED Provider Notes (Signed)
History     CSN: [id number]  Arrival date & time 10/04/11  1735   First MD Initiated Contact with Patient 10/04/11 1913      Chief Complaint  Patient presents with  . Rectal Bleeding     HPI Patient comes in with 2-3 days of dark melanotic stools.  These have been associated with some constipation and small formed stools.  Patient denies abdominal or epigastric pain.  Denies nausea or vomiting.  Has had some mild low back pain which is chronic but otherwise no discomfort.  Patient has pertinent past history of cholecystectomy and gastroesophageal reflux disease with a Nissen fundoplication for a hiatal hernia.  Patient was seen at the urgent care Center where a stool guaiac was found to be positive for blood and lab work was drawn.  Patient denies any medications such as NSAIDs or aspirin which might be precipitating a gastritis. History reviewed. No pertinent past medical history.  Past Surgical History  Procedure Date  . Cholecystectomy   . Nissen fundoplication     No family history on file.  History  Substance Use Topics  . Smoking status: Never Smoker   . Smokeless tobacco: Never Used  . Alcohol Use: No      Review of Systems  Constitutional: Negative.  Negative for chills.  HENT: Negative.   Eyes: Negative.   Respiratory: Negative.   Cardiovascular: Negative.   Gastrointestinal: Positive for constipation and blood in stool. Negative for nausea, vomiting, abdominal pain, diarrhea and rectal pain.  Genitourinary: Negative.   Musculoskeletal: Negative.   Skin: Negative.   Neurological: Positive for dizziness.    Allergies  Review of patient's allergies indicates no known allergies.  Home Medications   Current Outpatient Rx  Name Route Sig Dispense Refill  . PANTOPRAZOLE SODIUM 20 MG PO TBEC Oral Take 20 mg by mouth daily as needed. For heartburn    . DOCUSATE SODIUM 100 MG PO CAPS Oral Take 1 capsule (100 mg total) by mouth every 12 (twelve) hours. 60 capsule  0  . POLYETHYLENE GLYCOL 3350 PO POWD Oral Take 17 g by mouth daily. 255 g 0    BP 119/74  Pulse 64  Temp(Src) 98.1 F (36.7 C) (Oral)  Resp 16  Ht 5\' 9"  (1.753 m)  Wt 188 lb (85.276 kg)  BMI 27.76 kg/m2  SpO2 99%  Physical Exam  Nursing note and vitals reviewed. Constitutional: He is oriented to person, place, and time. He appears well-developed and well-nourished.  HENT:  Head: Normocephalic and atraumatic.  Eyes: EOM are normal.  Neck: Normal range of motion.  Pulmonary/Chest: Effort normal.  Abdominal: Soft. Normal appearance and bowel sounds are normal. There is no tenderness.       Several healed scars from laparoscopic surgery  Genitourinary: Prostate normal. Rectal exam shows no external hemorrhoid, no internal hemorrhoid, no fissure, no mass, no tenderness and anal tone normal. Guaiac positive stool.  Musculoskeletal: Normal range of motion.  Neurological: He is alert and oriented to person, place, and time.  Skin: Skin is warm and dry.  Psychiatric: His behavior is normal.    ED Course  Procedures (including critical care time)  Labs Reviewed - No data to display Ct Abdomen Pelvis W Contrast  10/04/2011  *RADIOLOGY REPORT*  Clinical Data: Low back pain, dark stools, constipation  CT ABDOMEN AND PELVIS WITH CONTRAST  Technique:  Multidetector CT imaging of the abdomen and pelvis was performed following the standard protocol during bolus administration of intravenous contrast.  Contrast: OMNIPAQUE IOHEXOL 300 MG/ML  SOLN  Comparison: 01/18/2011  Findings: Lung bases are clear.  Moderate hiatal hernia. Surgical clips at the GE junction.  Liver, spleen, pancreas, and adrenal glands are within normal limits.  Status post cholecystectomy.  No intrahepatic or extrahepatic ductal dilatation.  Tiny left lower pole cyst (series 5/image 24).  The kidneys are otherwise within normal limits.  No hydronephrosis.  No evidence of bowel obstruction.  Normal appendix.  Extensive  colonic diverticulosis, without associated inflammatory changes.  Atherosclerotic calcifications of the abdominal aorta and branch vessels.  No abdominopelvic ascites.  No suspicious abdominopelvic lymphadenopathy.  Prostate is unremarkable.  Small fat-containing umbilical hernia.  Small fat-containing right inguinal hernia.  Degenerative changes of the visualized thoracolumbar spine, most prominent at T12-L1.  IMPRESSION: Normal appendix.  No evidence of bowel obstruction.  Moderate hiatal hernia.  No CT findings to account for the patient's abdominal pain.  Original Report Authenticated By: Charline Bills, M.D.     1. Rectal bleeding   2. Diverticulosis       MDM          Nelia Shi, MD 10/05/11 0200

## 2011-10-04 NOTE — ED Notes (Signed)
Pt drinking contrast.  States he has had lower abd pain x 3 days and dark, tarry stool.  States hx of having several polyps removed during a colonoscopy several years ago, but denies any other abd hx.  Also c/o lower back pain x 6 months that awakes him from sleep.

## 2011-10-04 NOTE — ED Notes (Signed)
Pt. Is constipated

## 2011-10-04 NOTE — ED Notes (Signed)
3 days of blood in his stools with abdominal  Cramping,  Also having lower back pain for a few months, denies any n/v/d

## 2011-10-07 ENCOUNTER — Other Ambulatory Visit (INDEPENDENT_AMBULATORY_CARE_PROVIDER_SITE_OTHER): Payer: BC Managed Care – PPO

## 2011-10-07 ENCOUNTER — Telehealth: Payer: Self-pay | Admitting: Gastroenterology

## 2011-10-07 ENCOUNTER — Encounter: Payer: Self-pay | Admitting: Nurse Practitioner

## 2011-10-07 ENCOUNTER — Ambulatory Visit (INDEPENDENT_AMBULATORY_CARE_PROVIDER_SITE_OTHER): Payer: BC Managed Care – PPO | Admitting: Nurse Practitioner

## 2011-10-07 VITALS — BP 128/80 | HR 120 | Ht 69.0 in | Wt 187.8 lb

## 2011-10-07 DIAGNOSIS — K921 Melena: Secondary | ICD-10-CM

## 2011-10-07 DIAGNOSIS — K219 Gastro-esophageal reflux disease without esophagitis: Secondary | ICD-10-CM

## 2011-10-07 LAB — CBC WITH DIFFERENTIAL/PLATELET
Basophils Absolute: 0 10*3/uL (ref 0.0–0.1)
Eosinophils Absolute: 0.1 10*3/uL (ref 0.0–0.7)
HCT: 31.3 % — ABNORMAL LOW (ref 39.0–52.0)
Hemoglobin: 10.8 g/dL — ABNORMAL LOW (ref 13.0–17.0)
Lymphs Abs: 2.5 10*3/uL (ref 0.7–4.0)
MCHC: 34.5 g/dL (ref 30.0–36.0)
MCV: 95 fl (ref 78.0–100.0)
Monocytes Absolute: 0.6 10*3/uL (ref 0.1–1.0)
Neutro Abs: 4.4 10*3/uL (ref 1.4–7.7)
RDW: 13.1 % (ref 11.5–14.6)

## 2011-10-07 NOTE — Patient Instructions (Signed)
Please go to the basement level to have your labs drawn.   [name] is in contact with Dr. Russella Dar and we will see what the Hemoglobin level is tomorrow AM.  We will call you tomorrow about scheduling the Endoscopy.

## 2011-10-07 NOTE — Telephone Encounter (Signed)
Patient will come in today and see Willette Cluster RNP at 3:00

## 2011-10-07 NOTE — Progress Notes (Signed)
[name] [id number] 14-Oct-1950   HISTORY OR PRESENT ILLNESS : Patient is a 61 year old male followed by Dr. Russella Dar for a  history of [place] fundoplication, colon polyps, GERD, and diverticulosis. In September 2012 patient had a bile leak after his cholecystectomy. He had an ERCP with stent placement followed by stent removal.   Patient was in the emergency department two days ago with complaints of melenic stools. He had been constipated but then stools actually became loose. In emergency department he was Hemoccult-positive, hemoglobin normal at 16 but BUN elevated at 44.  A CT scan of the abdomen and pelvis with contrast showed a moderate-sized hiatal hernia, otherwise normal.  Patient has no abdominal pain but he has been nauseated.  He takes Protonix as needed for GERD symptoms though he had a Nissen several years ago. Patient continued to have black stools up until yesterday. He does not take NSAIDs. He had some mild lightheadedness yesterday.   Current Medications, Allergies, Past Medical History, Past Surgical History, Family History and Social History were reviewed in [employer] record.   PHYSICAL EXAMINATION : General:  Well developed  white male in no acute distress Head: Normocephalic and atraumatic Eyes:  sclerae anicteric,conjunctive pink. Ears: Normal auditory acuity Neck: Supple, no masses.  Lungs: Clear throughout to auscultation Heart: Regular rate and rhythm; no murmurs heard Abdomen: Soft, nondistended, nontender. No masses or hepatomegaly noted. Normal bowel sounds Rectal: Small amount of black, heme positive stool.  Musculoskeletal: Symmetrical with no gross deformities  Skin: No lesions on visible extremities Extremities: No edema or deformities noted Neurological: Oriented, grossly nonfocal Cervical Nodes:  No significant cervical adenopathy Psychological:  Alert and cooperative. Normal mood and affect  ASSESSMENT AND PLAN :  1.  Upper GI bleed / mild nausea. Patient has slow upper GI bleed with melena on exam today. .Rule out PUD, Cameron erosions, AVMs.  Not actively bleeding, no BMs today, hemoglobin 16 two days ago in ED. Patient is hemodynamically stable. Will recheck labs today. He needs upper endoscopy for further evaluation. Patient's primary gastroenterologist, Dr. Russella Dar, is covering the hospital this week and will perform the procedure on Wed.. The benefits, risks, and potential complications of EGD with possible biopsies and/or dilation were discussed with the patient and he agrees to proceed. Patient needs daily PPI for now. Absolutely no NSAIDS for now.  2. GERD / history of Nissen fundoplication several years ago.  CTscan show moderate hiatal hernia. He takes PPI as needed.

## 2011-10-08 ENCOUNTER — Encounter: Payer: Self-pay | Admitting: Nurse Practitioner

## 2011-10-08 ENCOUNTER — Encounter (HOSPITAL_COMMUNITY): Payer: Self-pay

## 2011-10-08 ENCOUNTER — Telehealth: Payer: Self-pay | Admitting: *Deleted

## 2011-10-08 ENCOUNTER — Telehealth: Payer: Self-pay | Admitting: Nurse Practitioner

## 2011-10-08 ENCOUNTER — Ambulatory Visit (HOSPITAL_COMMUNITY)
Admission: RE | Admit: 2011-10-08 | Payer: BC Managed Care – PPO | Source: Ambulatory Visit | Admitting: Gastroenterology

## 2011-10-08 ENCOUNTER — Encounter (HOSPITAL_COMMUNITY)
Admission: EM | Disposition: A | Discharge status: Home / Self Care | Payer: Self-pay | Source: Home / Self Care | Attending: Internal Medicine

## 2011-10-08 ENCOUNTER — Encounter (HOSPITAL_COMMUNITY): Payer: Self-pay | Admitting: Gastroenterology

## 2011-10-08 ENCOUNTER — Inpatient Hospital Stay (HOSPITAL_COMMUNITY)
Admission: EM | Admit: 2011-10-08 | Discharge: 2011-10-10 | DRG: 551 | Disposition: A | Discharge status: Home / Self Care | Payer: BC Managed Care – PPO | Attending: Internal Medicine | Admitting: Internal Medicine

## 2011-10-08 DIAGNOSIS — K2210 Ulcer of esophagus without bleeding: Secondary | ICD-10-CM

## 2011-10-08 DIAGNOSIS — K219 Gastro-esophageal reflux disease without esophagitis: Secondary | ICD-10-CM | POA: Diagnosis present

## 2011-10-08 DIAGNOSIS — K2100 Gastro-esophageal reflux disease with esophagitis, without bleeding: Secondary | ICD-10-CM | POA: Diagnosis present

## 2011-10-08 DIAGNOSIS — M545 Low back pain, unspecified: Secondary | ICD-10-CM | POA: Diagnosis present

## 2011-10-08 DIAGNOSIS — K208 Other esophagitis without bleeding: Principal | ICD-10-CM

## 2011-10-08 DIAGNOSIS — D62 Acute posthemorrhagic anemia: Secondary | ICD-10-CM | POA: Diagnosis present

## 2011-10-08 DIAGNOSIS — K922 Gastrointestinal hemorrhage, unspecified: Secondary | ICD-10-CM

## 2011-10-08 DIAGNOSIS — K2270 Barrett's esophagus without dysplasia: Secondary | ICD-10-CM | POA: Diagnosis present

## 2011-10-08 DIAGNOSIS — Z9089 Acquired absence of other organs: Secondary | ICD-10-CM

## 2011-10-08 DIAGNOSIS — K5900 Constipation, unspecified: Secondary | ICD-10-CM | POA: Diagnosis present

## 2011-10-08 DIAGNOSIS — R000 Tachycardia, unspecified: Secondary | ICD-10-CM | POA: Diagnosis present

## 2011-10-08 DIAGNOSIS — K449 Diaphragmatic hernia without obstruction or gangrene: Secondary | ICD-10-CM | POA: Diagnosis present

## 2011-10-08 DIAGNOSIS — K228 Other specified diseases of esophagus: Secondary | ICD-10-CM | POA: Diagnosis present

## 2011-10-08 DIAGNOSIS — Z860100 Personal history of colon polyps, unspecified: Secondary | ICD-10-CM

## 2011-10-08 DIAGNOSIS — D649 Anemia, unspecified: Secondary | ICD-10-CM

## 2011-10-08 DIAGNOSIS — K2289 Other specified disease of esophagus: Secondary | ICD-10-CM | POA: Diagnosis present

## 2011-10-08 DIAGNOSIS — K5730 Diverticulosis of large intestine without perforation or abscess without bleeding: Secondary | ICD-10-CM | POA: Diagnosis present

## 2011-10-08 DIAGNOSIS — K921 Melena: Secondary | ICD-10-CM | POA: Insufficient documentation

## 2011-10-08 HISTORY — PX: ESOPHAGOGASTRODUODENOSCOPY: SHX5428

## 2011-10-08 LAB — CARDIAC PANEL(CRET KIN+CKTOT+MB+TROPI)
CK, MB: 1.2 ng/mL (ref 0.3–4.0)
Total CK: 49 U/L (ref 7–232)

## 2011-10-08 LAB — CBC
HCT: 23.1 % — ABNORMAL LOW (ref 39.0–52.0)
HCT: 22.1 % — ABNORMAL LOW (ref 39.0–52.0)
Hemoglobin: 8.1 g/dL — ABNORMAL LOW (ref 13.0–17.0)
MCH: 32.7 pg (ref 26.0–34.0)
MCHC: 35.1 g/dL (ref 30.0–36.0)
MCHC: 34.8 g/dL (ref 30.0–36.0)
MCV: 93.1 fL (ref 78.0–100.0)
Platelets: 232 K/uL (ref 150–400)
Platelets: 203 10*3/uL (ref 150–400)
RBC: 2.48 MIL/uL — ABNORMAL LOW (ref 4.22–5.81)
RDW: 12.8 % (ref 11.5–15.5)
RDW: 14.3 % (ref 11.5–15.5)
WBC: 9.4 K/uL (ref 4.0–10.5)
WBC: 8.9 10*3/uL (ref 4.0–10.5)

## 2011-10-08 LAB — BASIC METABOLIC PANEL
Chloride: 109 mEq/L (ref 96–112)
Creatinine, Ser: 0.96 mg/dL (ref 0.50–1.35)
GFR calc Af Amer: >90 mL/min (ref >90)
GFR calc non Af Amer: 90 mL/min — ABNORMAL LOW (ref >90)
Potassium: 4.3 mEq/L (ref 3.5–5.1)

## 2011-10-08 LAB — ABO/RH: ABO/RH(D): O POS

## 2011-10-08 LAB — BASIC METABOLIC PANEL WITH GFR
BUN: 33 mg/dL — ABNORMAL HIGH (ref 6–23)
CO2: 24 meq/L (ref 19–32)
Calcium: 7.4 mg/dL — ABNORMAL LOW (ref 8.4–10.5)
Glucose, Bld: 100 mg/dL — ABNORMAL HIGH (ref 70–99)
Sodium: 138 meq/L (ref 135–145)

## 2011-10-08 LAB — DIFFERENTIAL
Basophils Absolute: 0.1 10*3/uL (ref 0.0–0.1)
Basophils Relative: 1 % (ref 0–1)
Eosinophils Absolute: 0 10*3/uL (ref 0.0–0.7)
Eosinophils Absolute: 0 10*3/uL (ref 0.0–0.7)
Eosinophils Relative: 0 % (ref 0–5)
Lymphocytes Relative: 16 % (ref 12–46)
Lymphs Abs: 1.5 K/uL (ref 0.7–4.0)
Lymphs Abs: 3.1 10*3/uL (ref 0.7–4.0)
Monocytes Absolute: 0.6 K/uL (ref 0.1–1.0)
Monocytes Relative: 6 % (ref 3–12)
Monocytes Relative: 7 % (ref 3–12)
Neutro Abs: 7.3 10*3/uL (ref 1.7–7.7)
Neutro Abs: 5.2 10*3/uL (ref 1.7–7.7)
Neutrophils Relative %: 78 % — ABNORMAL HIGH (ref 43–77)
Neutrophils Relative %: 58 % (ref 43–77)

## 2011-10-08 LAB — COMPREHENSIVE METABOLIC PANEL
AST: 16 U/L (ref 0–37)
BUN: 38 mg/dL — ABNORMAL HIGH (ref 6–23)
CO2: 20 mEq/L (ref 19–32)
Calcium: 6.7 mg/dL — ABNORMAL LOW (ref 8.4–10.5)
Chloride: 111 mEq/L (ref 96–112)
Creatinine, Ser: 0.83 mg/dL (ref 0.50–1.35)
GFR calc Af Amer: >90 mL/min (ref >90)
GFR calc non Af Amer: >90 mL/min (ref >90)
Total Bilirubin: 0.3 mg/dL (ref 0.3–1.2)

## 2011-10-08 LAB — APTT
aPTT: 28 seconds (ref 24–37)
aPTT: 27 seconds (ref 24–37)

## 2011-10-08 LAB — PROCALCITONIN: Procalcitonin: <0.1 ng/mL

## 2011-10-08 LAB — PROTIME-INR
INR: 1.24 (ref 0.00–1.49)
INR: 1.26 (ref 0.00–1.49)
Prothrombin Time: 15.9 s — ABNORMAL HIGH (ref 11.6–15.2)
Prothrombin Time: 16.1 seconds — ABNORMAL HIGH (ref 11.6–15.2)

## 2011-10-08 LAB — HEMOGLOBIN AND HEMATOCRIT, BLOOD: HCT: 25.8 % — ABNORMAL LOW (ref 39.0–52.0)

## 2011-10-08 LAB — PREPARE RBC (CROSSMATCH)

## 2011-10-08 LAB — MAGNESIUM: Magnesium: 1.7 mg/dL (ref 1.5–2.5)

## 2011-10-08 LAB — PHOSPHORUS: Phosphorus: 2.4 mg/dL (ref 2.3–4.6)

## 2011-10-08 SURGERY — EGD (ESOPHAGOGASTRODUODENOSCOPY)
Anesthesia: Moderate Sedation

## 2011-10-08 MED ORDER — PANTOPRAZOLE SODIUM 40 MG IV SOLR
40.0000 mg | Freq: Once | INTRAVENOUS | Status: AC
  Administered 2011-10-08: 40 mg via INTRAVENOUS

## 2011-10-08 MED ORDER — SODIUM CHLORIDE 0.9 % IV BOLUS (SEPSIS)
500.0000 mL | Freq: Once | INTRAVENOUS | Status: AC
  Administered 2011-10-08: 500 mL via INTRAVENOUS

## 2011-10-08 MED ORDER — FENTANYL NICU IV SYRINGE 50 MCG/ML
INJECTION | INTRAMUSCULAR | Status: DC | PRN
  Administered 2011-10-08 (×4): 25 ug via INTRAVENOUS

## 2011-10-08 MED ORDER — ACETAMINOPHEN 650 MG RE SUPP: 650.0000 mg | Freq: Four times a day (QID) | RECTAL | Status: DC | PRN

## 2011-10-08 MED ORDER — SODIUM CHLORIDE 0.9 % IV SOLN
8.0000 mg/h | INTRAVENOUS | Status: DC
  Administered 2011-10-08 – 2011-10-09 (×2): 8 mg/h via INTRAVENOUS
  Filled 2011-10-08 (×7): qty 80

## 2011-10-08 MED ORDER — POLYETHYLENE GLYCOL 3350 17 G PO PACK
17.0000 g | PACK | Freq: Every day | ORAL | Status: DC
  Administered 2011-10-09 – 2011-10-10 (×2): 17 g via ORAL
  Filled 2011-10-08 (×4): qty 1

## 2011-10-08 MED ORDER — ONDANSETRON HCL 4 MG PO TABS: 4.0000 mg | ORAL_TABLET | Freq: Four times a day (QID) | ORAL | Status: DC | PRN

## 2011-10-08 MED ORDER — PANTOPRAZOLE SODIUM 40 MG IV SOLR: 40.0000 mg | Freq: Two times a day (BID) | INTRAVENOUS | Status: DC

## 2011-10-08 MED ORDER — SODIUM CHLORIDE 0.9 % IJ SOLN: 3.0000 mL | Freq: Two times a day (BID) | INTRAMUSCULAR | Status: DC

## 2011-10-08 MED ORDER — PANTOPRAZOLE SODIUM 40 MG IV SOLR
40.0000 mg | Freq: Once | INTRAVENOUS | Status: AC
  Administered 2011-10-08: 40 mg via INTRAVENOUS
  Filled 2011-10-08: qty 80

## 2011-10-08 MED ORDER — ONDANSETRON HCL 4 MG/2ML IJ SOLN: 4.0000 mg | Freq: Four times a day (QID) | INTRAMUSCULAR | Status: DC | PRN

## 2011-10-08 MED ORDER — SODIUM CHLORIDE 0.9 % IV SOLN
INTRAVENOUS | Status: DC
  Administered 2011-10-08 – 2011-10-09 (×2): via INTRAVENOUS

## 2011-10-08 MED ORDER — ACETAMINOPHEN 325 MG PO TABS
650.0000 mg | ORAL_TABLET | Freq: Four times a day (QID) | ORAL | Status: DC | PRN
  Administered 2011-10-09: 650 mg via ORAL
  Filled 2011-10-08: qty 2

## 2011-10-08 MED ORDER — SENNOSIDES-DOCUSATE SODIUM 8.6-50 MG PO TABS
1.0000 | ORAL_TABLET | Freq: Every evening | ORAL | Status: DC | PRN
  Filled 2011-10-08: qty 1

## 2011-10-08 MED ORDER — MIDAZOLAM HCL 10 MG/2ML IJ SOLN
INTRAMUSCULAR | Status: DC | PRN
  Administered 2011-10-08 (×4): 2.5 mg via INTRAVENOUS

## 2011-10-08 MED ORDER — DIPHENHYDRAMINE HCL 50 MG/ML IJ SOLN
INTRAMUSCULAR | Status: DC | PRN
  Administered 2011-10-08: 50 mg via INTRAVENOUS

## 2011-10-08 NOTE — H&P (View-Only) (Signed)
[name] [id number] 01/18/1951   HISTORY OR PRESENT ILLNESS : Patient is a 60 year old male followed by Dr. Stark for a  history of [place] fundoplication, colon polyps, GERD, and diverticulosis. In September 2012 patient had a bile leak after his cholecystectomy. He had an ERCP with stent placement followed by stent removal.   Patient was in the emergency department two days ago with complaints of melenic stools. He had been constipated but then stools actually became loose. In emergency department he was Hemoccult-positive, hemoglobin normal at 16 but BUN elevated at 44.  A CT scan of the abdomen and pelvis with contrast showed a moderate-sized hiatal hernia, otherwise normal.  Patient has no abdominal pain but he has been nauseated.  He takes Protonix as needed for GERD symptoms though he had a Nissen several years ago. Patient continued to have black stools up until yesterday. He does not take NSAIDs. He had some mild lightheadedness yesterday.   Current Medications, Allergies, Past Medical History, Past Surgical History, Family History and Social History were reviewed in [facility] electronic medical record.   PHYSICAL EXAMINATION : General:  Well developed  white male in no acute distress Head: Normocephalic and atraumatic Eyes:  sclerae anicteric,conjunctive pink. Ears: Normal auditory acuity Neck: Supple, no masses.  Lungs: Clear throughout to auscultation Heart: Regular rate and rhythm; no murmurs heard Abdomen: Soft, nondistended, nontender. No masses or hepatomegaly noted. Normal bowel sounds Rectal: Small amount of black, heme positive stool.  Musculoskeletal: Symmetrical with no gross deformities  Skin: No lesions on visible extremities Extremities: No edema or deformities noted Neurological: Oriented, grossly nonfocal Cervical Nodes:  No significant cervical adenopathy Psychological:  Alert and cooperative. Normal mood and affect  ASSESSMENT AND PLAN :  1.  Upper GI bleed / mild nausea. Patient has slow upper GI bleed with melena on exam today. .Rule out PUD, Cameron erosions, AVMs.  Not actively bleeding, no BMs today, hemoglobin 16 two days ago in ED. Patient is hemodynamically stable. Will recheck labs today. He needs upper endoscopy for further evaluation. Patient's primary gastroenterologist, Dr. Stark, is covering the hospital this week and will perform the procedure on Wed.. The benefits, risks, and potential complications of EGD with possible biopsies and/or dilation were discussed with the patient and he agrees to proceed. Patient needs daily PPI for now. Absolutely no NSAIDS for now.  2. GERD / history of Nissen fundoplication several years ago.  CTscan show moderate hiatal hernia. He takes PPI as needed.   

## 2011-10-08 NOTE — Telephone Encounter (Signed)
[name] talked to Dr. Russella Dar and he said he doesn't need to have the MAC sedation for the EGD he wants to perform.  I just dialed the pt and I gave the receiver to [place] and she explained the HGB level and that Dr Russella Dar wants to do the procedure Wed 10-09-2011.

## 2011-10-08 NOTE — ED Notes (Signed)
Pt placed into hospital bed d/t delayed bed placement, updated pt and family on POC

## 2011-10-08 NOTE — H&P (Signed)
PCP:  Judie Petit, MD, MD   DOA:  10/08/2011 11:26 AM  Chief Complaint:  Dark stool  HPI: 61 year-old [name] with multiple medical history including but not limited to GERD, reflux esophagitis, status post Nissen fundoplication in 2006, bile leak (status post cholecystectomy) that required stent placement who presented to emergency room after having continuous black stools. Patient had the same presentation 10/04/2011 at which time he was seen in [facility] ED but hemoglobin at that time was 16 and he was discharged home. CT scan done at that time showed moderate size hiatal hernia. He has been taking Protonix regularly but continued to have black stools. He was seen in GI office one day prior to the admission and at that time hemoglobin dropped to 10. He was supposed to have an endoscopy 10/10/2011 however due to complaints of dizziness and lightheadedness he decided to come to emergency room for evaluation. Patient reports he did not pass out although had he had not sat down he probably would have. Patient reports no complaints of chest pain, abdominal pain. He does have some nausea but no vomiting. He reports no visible blood in the stool. No complaints of shortness of breath, cough or palpitations. No complaints of fever or chills. Patient is admitted to hospitalist service for further evaluation and management.  Assessment/Plan  Principal Problem:   *Acute blood loss anemia - Secondary to upper GI bleed - Patient is currently in the OR suite for endoscopy - we will follow up on recommendations - Transfusion will be started once patient returns to emergency room after an endoscopy - CBC every 8 hours - Transfuse when necessary - Patient may require Protonix drip but we will wait for GI evaluation before we start it  Active Problems:  Upper GI bleed - Likely secondary to erosive esophagitis - GI is following - Followup on endoscopy results   Tachycardia - Perhaps secondary to  acute blood loss anemia - We will reassess once patient receives transfusion  Erosive esophagitis - Patient will begin on Protonix drip or Protonix IV 40 mg twice daily - Will followup with GI  DVT Prophylaxis - bilateral SCDs  Code Status - full code  Education  - test results and diagnostic studies were discussed with patient and pt's family who was present at the bedside - patient and family have verbalized the understanding - questions were answered at the bedside and contact information was provided for additional questions or concerns   Allergies: No Known Allergies  Prior to Admission medications   Medication Sig Start Date End Date Taking? Authorizing Provider  docusate sodium (COLACE) 100 MG capsule Take 1 capsule (100 mg total) by mouth every 12 (twelve) hours. 10/04/11 10/14/11 Yes Heather Zenaida Niece Wingen, PA-C  pantoprazole (PROTONIX) 20 MG tablet Take 20 mg by mouth daily as needed. For heartburn 02/01/11 02/01/12 Yes Valarie Merino, MD  polyethylene glycol [facility] / GLYCOLAX) packet Take 17 g by mouth daily.   Yes Historical Provider, MD  polyethylene glycol powder (GLYCOLAX/MIRALAX) powder Take 17 g by mouth daily. 10/04/11 10/07/11  Heather Anne Shutter, PA-C    Past Medical History  Diagnosis Date  . GERD (gastroesophageal reflux disease)   . GI bleed   . Diverticulosis   . Colon polyps     Dr Blossom Hoops    Past Surgical History  Procedure Date  . Cholecystectomy   . Nissen fundoplication   . Hernia repair     Social History:  reports that he has  never smoked. He has never used smokeless tobacco. He reports that he does not drink alcohol or use illicit drugs.  Family History  Problem Relation Age of Onset  . Colon cancer Neg Hx   . Breast cancer Maternal Aunt   . Lung cancer Mother   . Pancreatitis Father   . Esophageal cancer Paternal Uncle   . Heart disease Mother     Review of Systems:  Constitutional: Denies fever, chills, diaphoresis, appetite change  and fatigue.  HEENT: Denies photophobia, eye pain, redness, hearing loss, ear pain, congestion, sore throat, rhinorrhea, sneezing, mouth sores, trouble swallowing, neck pain, neck stiffness and tinnitus.   Respiratory: Denies SOB, DOE, cough, chest tightness,  and wheezing.   Cardiovascular: Denies chest pain, palpitations and leg swelling.  Gastrointestinal: Per history of present illness  Genitourinary: Denies dysuria, urgency, frequency, hematuria, flank pain and difficulty urinating.  Musculoskeletal: Denies myalgias, back pain, joint swelling, arthralgias and gait problem.  Skin: Denies pallor, rash and wound.  Neurological: Denies dizziness, seizures, syncope, weakness, light-headedness, numbness and headaches.  Hematological: Denies adenopathy. Easy bruising, personal or family bleeding history  Psychiatric/Behavioral: Denies suicidal ideation, mood changes, confusion, nervousness, sleep disturbance and agitation   Physical Exam:  Filed Vitals:   10/08/11 1641 10/08/11 1652 10/08/11 1707 10/08/11 1805  BP: 106/67 106/67 119/73 117/69  Pulse: 103 107 108 104  Temp: 99.1 F (37.3 C) 99.1 F (37.3 C) 99.5 F (37.5 C) 99 F (37.2 C)  TempSrc: Oral Oral Oral Oral  Resp: 17 18 20 20   Height:      Weight:      SpO2: 96%  96% 99%    Constitutional: Vital signs reviewed.  Patient is in no acute distress and cooperative with exam. Alert and oriented x3.  Head: Normocephalic and atraumatic Ear: TM normal bilaterally Mouth: no erythema or exudates, MMM Eyes: PERRL, EOMI, conjunctivae normal, No scleral icterus.  Neck: Supple, Trachea midline normal ROM, No JVD, mass, thyromegaly, or carotid bruit present.  Cardiovascular: RRR, S1 normal, S2 normal, no MRG, pulses symmetric and intact bilaterally Pulmonary/Chest: CTAB, no wheezes, rales, or rhonchi Abdominal: Soft. Non-tender, non-distended, bowel sounds are normal, no masses, organomegaly, or guarding present.  GU: no CVA  tenderness Musculoskeletal: No joint deformities, erythema, or stiffness, ROM full and no nontender Ext: no edema and no cyanosis, pulses palpable bilaterally (DP and PT) Hematology: no cervical, inginal, or axillary adenopathy.  Neurological: A&O x3, Strenght is normal and symmetric bilaterally, cranial nerve II-XII are grossly intact, no focal motor deficit, sensory intact to light touch bilaterally.  Skin: Warm, dry and intact. No rash, cyanosis, or clubbing.  Psychiatric: Normal mood and affect. speech and behavior is normal. Judgment and thought content normal. Cognition and memory are normal.   Labs on Admission:  Results for orders placed during the hospital encounter of 10/08/11 (from the past 48 hour(s))  CBC     Status: Abnormal   Collection Time   10/08/11 12:20 PM      Component Value Range Comment   WBC 9.4  4.0 - 10.5 (K/uL)    RBC 2.48 (*) 4.22 - 5.81 (MIL/uL)    Hemoglobin 8.1 (*) 13.0 - 17.0 (g/dL)    HCT 46.9 (*) 62.9 - 52.0 (%)    MCV 93.1  78.0 - 100.0 (fL)    MCH 32.7  26.0 - 34.0 (pg)    MCHC 35.1  30.0 - 36.0 (g/dL)    RDW 52.8  41.3 - 24.4 (%)  Platelets 232  150 - 400 (K/uL)   DIFFERENTIAL     Status: Abnormal   Collection Time   10/08/11 12:20 PM      Component Value Range Comment   Neutrophils Relative 78 (*) 43 - 77 (%)    Neutro Abs 7.3  1.7 - 7.7 (K/uL)    Lymphocytes Relative 16  12 - 46 (%)    Lymphs Abs 1.5  0.7 - 4.0 (K/uL)    Monocytes Relative 6  3 - 12 (%)    Monocytes Absolute 0.6  0.1 - 1.0 (K/uL)    Eosinophils Relative 0  0 - 5 (%)    Eosinophils Absolute 0.0  0.0 - 0.7 (K/uL)    Basophils Relative 1  0 - 1 (%)    Basophils Absolute 0.1  0.0 - 0.1 (K/uL)   BASIC METABOLIC PANEL     Status: Abnormal   Collection Time   10/08/11 12:20 PM      Component Value Range Comment   Sodium 138  135 - 145 (mEq/L)    Potassium 4.3  3.5 - 5.1 (mEq/L)    Chloride 109  96 - 112 (mEq/L)    CO2 24  19 - 32 (mEq/L)    Glucose, Bld 100 (*) Donald - 99 (mg/dL)     BUN 33 (*) 6 - 23 (mg/dL)    Creatinine, Ser 1.61  0.50 - 1.35 (mg/dL)    Calcium 7.4 (*) 8.4 - 10.5 (mg/dL)    GFR calc non Af Amer 90 (*) >90 (mL/min)    GFR calc Af Amer >90  >90 (mL/min)   APTT     Status: Normal   Collection Time   10/08/11 12:20 PM      Component Value Range Comment   aPTT 28  24 - 37 (seconds)   PROTIME-INR     Status: Abnormal   Collection Time   10/08/11 12:20 PM      Component Value Range Comment   Prothrombin Time 15.9 (*) 11.6 - 15.2 (seconds)    INR 1.24  0.00 - 1.49    TYPE AND SCREEN     Status: Normal (Preliminary result)   Collection Time   10/08/11 12:20 PM      Component Value Range Comment   ABO/RH(D) O POS      Antibody Screen NEG      Sample Expiration 10/11/2011      Unit Number [id number]      Blood Component Type RED CELLS,LR      Unit division 00      Status of Unit ISSUED      Transfusion Status OK TO TRANSFUSE      Crossmatch Result Compatible      Unit Number [id number]      Blood Component Type RED CELLS,LR      Unit division 00      Status of Unit ALLOCATED      Transfusion Status OK TO TRANSFUSE      Crossmatch Result Compatible     ABO/RH     Status: Normal   Collection Time   10/08/11 12:20 PM      Component Value Range Comment   ABO/RH(D) O POS     PREPARE RBC (CROSSMATCH)     Status: Normal   Collection Time   10/08/11  1:30 PM      Component Value Range Comment   Order Confirmation ORDER PROCESSED BY BLOOD BANK     HEMOGLOBIN AND HEMATOCRIT,  BLOOD     Status: Abnormal   Collection Time   10/08/11  4:30 PM      Component Value Range Comment   Hemoglobin 7.1 (*) 13.0 - 17.0 (g/dL)    HCT 16.1 (*) 09.6 - 52.0 (%)      Time Spent on Admission: Over 30 minutes  Anjalina Bergevin 10/08/2011, 6:48 PM  [facility] Pager # [phone] Main Office # [phone]

## 2011-10-08 NOTE — Consult Note (Signed)
I have taken a history, examined the patient and reviewed the chart. Presumed UGI bleed with melena and acute post hemorrhagic anemia. R/O ulcer, esophagitis, etc. IV volume resussitation, possible transfusion, monitor Hb/Hct. EGD today. I agree with the extender's note, impression and recommendations.  Meryl Dare MD [facility]

## 2011-10-08 NOTE — Op Note (Signed)
[facility] [address] [place], [place]  [address]  ENDOSCOPY PROCEDURE REPORT  PATIENT:  [name], [name]  MR#:  [id number] BIRTHDATE:  [birth year], 60 yrs. old  GENDER:  male ENDOSCOPIST:  Judie Petit T. Russella Dar, MD, [facility]  PROCEDURE DATE:  10/08/2011 PROCEDURE:  EGD, diagnostic 43235 ASA CLASS:  Class II INDICATIONS:  melena, hemorrhage of GI tract MEDICATIONS:  Benadryl 50 mg IV, Fentanyl 100 mcg IV, Versed 10 mg IV TOPICAL ANESTHETIC:  Cetacaine Spray DESCRIPTION OF PROCEDURE:   After the risks benefits and alternatives of the procedure were thoroughly explained, informed consent was obtained.  The Pentax Gastroscope [id number] endoscope was introduced through the mouth and advanced to the second portion of the duodenum, limited by blood and clots in the stomach.   The instrument was slowly withdrawn as the mucosa was fully examined. <<PROCEDUREIMAGES>> Esophagitis was found in the distal esophagus. It was severe, erosive and hemorrhagic with mild oozing that stopped spontaneously. LA Class Grade C.  Otherwise normal esophagus. Blood was found in the body and the fundus of the stomach obscuring complete mucosal visualization.  Otherwise normal stomach.  The duodenal bulb was normal in appearance, as was the postbulbar duodenum. Retroflexed views revealed a hiatal hernia, large.  The scope was then withdrawn from the patient and the procedure completed.  COMPLICATIONS:  None  ENDOSCOPIC IMPRESSION: 1) Hemorrhagic, severe erosive esophagitis 2) Blood/clots in the stomach 3) Large hiatal hernia  RECOMMENDATIONS: 1) Anti-reflux regimen long term 2) PPI gtt x 48-72 hrs, then PPI BID long term  Ry Moody T. Russella Dar, MD, [facility]  n. eSIGNED:   Venita Lick. Jagdeep Ancheta at 10/08/2011 03:31 PM  Montel Clock, [phone]

## 2011-10-08 NOTE — ED Notes (Signed)
Pt back from ENDO ? ?

## 2011-10-08 NOTE — ED Notes (Signed)
Pt noticed to have dark tarry stool since Friday, no nausea, vomiting, or diarrhea. Positive hx of GI Bleed. Visited ED on Friday and got send home. Pt went to visit [facility] today but never made it to the office due to a near syncope episode. Sts his hgb was low and was supposed to have a lab recheck today at [facility]. BP 127/92, HR 103, Sat 100%

## 2011-10-08 NOTE — ED Notes (Signed)
First unit of blood infusing.

## 2011-10-08 NOTE — ED Notes (Signed)
Blood products cont to infuse, pt resting quietly, awaiting inpt bed, cont to monitor

## 2011-10-08 NOTE — Interval H&P Note (Signed)
History and Physical Interval Note:  10/08/2011 3:01 PM  [name]  has presented today for surgery, with the diagnosis of GI Bleed, Low Hgb, 10.6  The various methods of treatment have been discussed with the patient and family. After consideration of risks, benefits and other options for treatment, the patient has consented to  Procedure(s) (LRB): ESOPHAGOGASTRODUODENOSCOPY (EGD) (N/A) as a surgical intervention .  The patients' history has been reviewed, patient examined, no change in status, stable for surgery.  I have reviewed the patients' chart and labs.  Questions were answered to the patient's satisfaction.     Venita Lick. Russella Dar MD [facility]

## 2011-10-08 NOTE — ED Notes (Signed)
Infusion rate increased to 150 ml/hr

## 2011-10-08 NOTE — ED Notes (Signed)
[id number]<AV> Expected date:<BR> Expected time:<BR> Means of arrival:<BR> Comments:<BR> Hold for pt in endo

## 2011-10-08 NOTE — ED Notes (Signed)
Pt to Endo 

## 2011-10-08 NOTE — ED Provider Notes (Signed)
History     CSN: [id number]  Arrival date & time 10/08/11  1118   First MD Initiated Contact with Patient 10/08/11 1141      No chief complaint on file.   (Consider location/radiation/quality/duration/timing/severity/associated sxs/prior treatment) HPI Comments: Level 5 caveat due to unstable vital signs.  Patient with a long-standing history of esophageal reflux disease status post Nissen procedure many years ago, who is on Protonix as needed, was seen for GI bleeding approximately one week ago. Based on prior records, his hemoglobin at that time was 16. Patient was encouraged to followup with his gastroenterologist and the patient was seen in followup. Patient reports he continues to see dark, melanotic stools. No abdominal pain, no nausea or vomiting. Patient did have some blood tests performed yesterday afternoon. They had scheduled an upper endoscopy procedure to be done in the future. However he has continued to feel somewhat lightheaded and weak with occasional palpitations. He reports this morning, he felt palpitations all night and continued to feel weaker and therefore had called the office. The plan was to go to the office for repeat blood draw. By this time they had scheduled his endoscopy to occur tomorrow to to worsening symptoms. At the office, the patient was very tachycardic and apparently was too weak to stand or to ambulate into the office and therefore EMS was called to transfer the patient here to demonstrate department. The patient endorses feeling very dizzy and lightheaded. He denies any chest pain. He denies any abdominal pain. He reports he has had some intermittent low back pains although unsure if that is attributable to the symptoms. Patient denies taking any aspirin or NSAIDs nor alcohol or smoking.  The history is provided by the patient, the spouse and medical records.    Past Medical History  Diagnosis Date  . GERD (gastroesophageal reflux disease)   . GI bleed     . Diverticulosis   . Colon polyps     Dr Blossom Hoops    Past Surgical History  Procedure Date  . Cholecystectomy   . Nissen fundoplication   . Hernia repair     Family History  Problem Relation Age of Onset  . Colon cancer Neg Hx   . Breast cancer Maternal Aunt   . Lung cancer Mother   . Pancreatitis Father   . Esophageal cancer Paternal Uncle   . Heart disease Mother     History  Substance Use Topics  . Smoking status: Never Smoker   . Smokeless tobacco: Never Used  . Alcohol Use: No      Review of Systems  Unable to perform ROS: Unstable vital signs    Allergies  Review of patient's allergies indicates no known allergies.  Home Medications   Current Outpatient Rx  Name Route Sig Dispense Refill  . DOCUSATE SODIUM 100 MG PO CAPS Oral Take 1 capsule (100 mg total) by mouth every 12 (twelve) hours. 60 capsule 0  . PANTOPRAZOLE SODIUM 20 MG PO TBEC Oral Take 20 mg by mouth daily as needed. For heartburn    . POLYETHYLENE GLYCOL 3350 PO PACK Oral Take 17 g by mouth daily.    Marland Kitchen POLYETHYLENE GLYCOL 3350 PO POWD Oral Take 17 g by mouth daily. 255 g 0    BP 124/65  Pulse 102  Temp(Src) 98.5 F (36.9 C) (Oral)  Resp 18  SpO2 100%  Physical Exam  Nursing note and vitals reviewed. Constitutional: He is oriented to person, place, and time. He appears well-developed  and well-nourished.  HENT:  Head: Normocephalic and atraumatic.  Mouth/Throat: Uvula is midline. Mucous membranes are pale.  Eyes: EOM are normal. Pupils are equal, round, and reactive to light. No scleral icterus.  Neck: Normal range of motion. Neck supple.  Cardiovascular: Regular rhythm, normal heart sounds and normal pulses.  Tachycardia present.   No murmur heard. Pulmonary/Chest: Effort normal.  Abdominal: Soft. He exhibits no distension. There is no tenderness. There is no rebound.  Musculoskeletal: He exhibits no edema.  Neurological: He is alert and oriented to person, place, and time.   Skin: Skin is warm and dry. There is pallor.    ED Course  Procedures (including critical care time)  CRITICAL CARE Performed by: Lear Ng.   Total critical care time: 30 min  Critical care time was exclusive of separately billable procedures and treating other patients.  Critical care was necessary to treat or prevent imminent or life-threatening deterioration.  Critical care was time spent personally by me on the following activities: development of treatment plan with patient and/or surrogate as well as nursing, discussions with consultants, evaluation of patient's response to treatment, examination of patient, obtaining history from patient or surrogate, ordering and performing treatments and interventions, ordering and review of laboratory studies, ordering and review of radiographic studies, pulse oximetry and re-evaluation of patient's condition.  Labs Reviewed  CBC - Abnormal; Notable for the following:    RBC 2.48 (*)    Hemoglobin 8.1 (*)    HCT 23.1 (*)    All other components within normal limits  DIFFERENTIAL - Abnormal; Notable for the following:    Neutrophils Relative 78 (*)    All other components within normal limits  BASIC METABOLIC PANEL - Abnormal; Notable for the following:    Glucose, Bld 100 (*)    BUN 33 (*)    Calcium 7.4 (*)    GFR calc non Af Amer 90 (*)    All other components within normal limits  PROTIME-INR - Abnormal; Notable for the following:    Prothrombin Time 15.9 (*)    All other components within normal limits  APTT  TYPE AND SCREEN  ABO/RH  PREPARE RBC (CROSSMATCH)   No results found.   1. GI bleed   2. Anemia     1:54 PM I spoke to [facility] who will see pt.  Will probably try to scope today. Asks that [facility] see pt for admission.  Will transfuse PRBC's for now.    MDM   I reviewed the patient's recent bouts as well as blood test. Apparently his hemoglobin yesterday had dropped to 10.8. Plan is to have 2 IVs,  repeat blood tests and anticipate possibly needing a blood transfusion. My plan also is to contact the [facility] to see the patient here in consultation. The patient is quite orthostatic although when he is lying still he seems rather stable. His skin color is quite pale and I suspect he is even more anemic today than he was yesterday. Patient will require admission possibly to a step down. IV Protonix was also ordered.        Gavin Pound. Jahmeek Shirk, MD 10/08/11 1355

## 2011-10-08 NOTE — Telephone Encounter (Signed)
Scheduled the EGD at [facility] with Annice Pih, Booking # [phone]. Dr Claudette Head Reg. Sedation ( No MAC needed) per Dr. Russella Dar.  Willette Cluster ACNP discussed this with Gunnar Fusi on 10-08-2011. Pt informed.

## 2011-10-08 NOTE — Consult Note (Signed)
Referring Provider: ER MD Primary Care Physician:  Judie Petit, MD, MD Primary Gastroenterologist:  Dr.Stark  Reason for Consultation:  Gi bleeding with lightheadedness  HPI: [name] is a 61 y.o. male known to Dr. Russella Dar, who is status post Nissen fundoplication in 2006 and also has history of colon polyps and diverticulosis. In September 2012 he  had a bile leak after a cholecystectomy and required ERCP with stent placement   Patient says he has done very well after his Nissen fundoplication and has been very pleased over the past several years. He does use Protonix on a when necessary basis but says generally he has no problems with heartburn and or indigestion. He has had no difficulty with dysphagia or odynophagia. He had onset on Friday, 10/04/2011 with black stools. He says he had about 5 episodes that day he did go to the emergency room at home however at that time he was found to have a hemoglobin of 16 and was discharged to home. Of note his BUN was elevated at 44. He did have a CT scan of the abdomen and pelvis which showed a moderate-sized hiatal hernia and otherwise was normal exam. Since then he has been taking his Protonix daily. He continues to have one to 2 black stools per day. He was seen in our office yesterday 10/08/2011 with continued complaints of dark stools. On rectal exam he had black Hemoccult-positive stool and hemoglobin was found to be 10.8. Arrangements were being made for upper endoscopy with Dr. Russella Dar on 10/10/2011. Now this morning he began having more  difficulty with dizziness and lightheadedness and felt as if he may pass out. He did have one black bowel movement this morning. He was advised to come to the emergency room and was found to have a hemoglobin of 8.1. His blood pressure was 124/65 on arrival and pulse was 130.   Past Medical History  Diagnosis Date  . GERD (gastroesophageal reflux disease)   . GI bleed   . Diverticulosis   . Colon  polyps     Dr Blossom Hoops    Past Surgical History  Procedure Date  . Cholecystectomy   . Nissen fundoplication   . Hernia repair     Prior to Admission medications   Medication Sig Start Date End Date Taking? Authorizing Provider  docusate sodium (COLACE) 100 MG capsule Take 1 capsule (100 mg total) by mouth every 12 (twelve) hours. 10/04/11 10/14/11 Yes Heather Zenaida Niece Wingen, PA-C  pantoprazole (PROTONIX) 20 MG tablet Take 20 mg by mouth daily as needed. For heartburn 02/01/11 02/01/12 Yes Valarie Merino, MD  polyethylene glycol [facility] / GLYCOLAX) packet Take 17 g by mouth daily.   Yes Historical Provider, MD  polyethylene glycol powder (GLYCOLAX/MIRALAX) powder Take 17 g by mouth daily. 10/04/11 10/07/11  Heather Zenaida Niece Wingen, PA-C    Current Facility-Administered Medications  Medication Dose Route Frequency Provider Last Rate Last Dose  . pantoprazole (PROTONIX) 80 mg in sodium chloride 0.9 % 250 mL infusion  8 mg/hr Intravenous Continuous Gavin Pound. Ghim, MD      . pantoprazole (PROTONIX) injection 40 mg  40 mg Intravenous Once Gavin Pound. Ghim, MD   40 mg at 10/08/11 1319  . pantoprazole (PROTONIX) injection 40 mg  40 mg Intravenous Once Gavin Pound. Ghim, MD   40 mg at 10/08/11 1325  . sodium chloride 0.9 % bolus 500 mL  500 mL Intravenous Once Gavin Pound. Ghim, MD   500 mL at 10/08/11 1259  Current Outpatient Prescriptions  Medication Sig Dispense Refill  . docusate sodium (COLACE) 100 MG capsule Take 1 capsule (100 mg total) by mouth every 12 (twelve) hours.  60 capsule  0  . pantoprazole (PROTONIX) 20 MG tablet Take 20 mg by mouth daily as needed. For heartburn      . polyethylene glycol (MIRALAX / GLYCOLAX) packet Take 17 g by mouth daily.      . polyethylene glycol powder (GLYCOLAX/MIRALAX) powder Take 17 g by mouth daily.  255 g  0  . DISCONTD: pantoprazole (PROTONIX) 20 MG tablet Take 1 tablet (20 mg total) by mouth daily.  60 tablet  3    Allergies as of 10/08/2011  . (No Known  Allergies)    Family History  Problem Relation Age of Onset  . Colon cancer Neg Hx   . Breast cancer Maternal Aunt   . Lung cancer Mother   . Pancreatitis Father   . Esophageal cancer Paternal Uncle   . Heart disease Mother     History   Social History  . Marital Status: Married    Spouse Name: N/A    Number of Children: N/A  . Years of Education: N/A   Occupational History  . Not on file.   Social History Main Topics  . Smoking status: Never Smoker   . Smokeless tobacco: Never Used  . Alcohol Use: No  . Drug Use: No  . Sexually Active: Not on file   Other Topics Concern  . Not on file   Social History Narrative  . No narrative on file    Review of Systems: Pertinent positive and negative review of systems were noted in the above HPI section.  All other review of systems was otherwise negative.  Physical Exam: Vital signs in last 24 hours: Temp:  [98.4 F (36.9 C)-98.5 F (36.9 C)] 98.5 F (36.9 C) (04/09 1339) Pulse Rate:  [99-123] 102  (04/09 1339) Resp:  [18] 18  (04/09 1339) BP: (88-128)/(60-92) 124/65 mmHg (04/09 1339) SpO2:  [96 %-100 %] 100 % (04/09 1339) Weight:  [187 lb 12.8 oz (85.186 kg)] 187 lb 12.8 oz (85.186 kg) (04/08 1509)   General:   Alert,  Well-developed, well-nourished, pleasant and cooperative in NAD Head:  Normocephalic and atraumatic. Eyes:  Sclera clear, no icterus.   Conjunctiva pale Ears:  Normal auditory acuity. Nose:  No deformity, discharge,  or lesions. Mouth:  No deformity or lesions.   Neck:  Supple; no masses or thyromegaly. Lungs:  Clear throughout to auscultation.   No wheezes, crackles, or rhonchi. Heart:Tachy  Regular rate and rhythm; no murmurs, clicks, rubs,  or gallops. Abdomen:  Soft,nontender, BS active,nonpalp mass or hsm.   Rectal:  Deferred  Msk:  Symmetrical without gross deformities. . Pulses:  Normal pulses noted. Extremities:  Without clubbing or edema. Neurologic:  Alert and  oriented x4;  grossly  normal neurologically. Skin:  Intact without significant lesions or rashes.. Psych:  Alert and cooperative. Normal mood and affect.  Intake/Output from previous day:   Intake/Output this shift:    Lab Results:  Basename 10/08/11 1220 10/07/11 1638  WBC 9.4 7.7  HGB 8.1* 10.8*  HCT 23.1* 31.3*  PLT 232 262.0   BMET  Basename 10/08/11 1220  NA 138  K 4.3  CL 109  CO2 24  GLUCOSE 100*  BUN 33*  CREATININE 0.96  CALCIUM 7.4*     PT/INR  Basename 10/08/11 1220  LABPROT 15.9*  INR 1.24  Studies/Results: No results found.  IMPRESSION:  #1 61 yo  old male with acute upper GI bleed time I've days. Rule out peptic ulcer disease versus esophageal ulcer.  #2 anemia, acute progressive secondary to GI blood loss   #3 orthostasis secondary to #1  #4 status post Nissen fundoplication 2006 with evidence for recurrent hernia on recent CT scan #5 history of diverticulosis and colon polyps  PLAN: Will proceed with upper endoscopy this afternoon. Agree with IV Protonix twice daily Agree with transfusion x2 this afternoon and then when necessary transfusions to keep his hemoglobin in the 9 range given symptomatic with dizziness and lightheadedness with hemoglobin of 8. Thank you we will follow with you   Thressa Shiffer  10/08/2011, 2:15 PM

## 2011-10-08 NOTE — Telephone Encounter (Signed)
Called pt also and asked him to come to our 3rd floor and ask for Archibald Marchetta.  I do need him to sign the wavier regarding the EGD at [facility].  I LM on him ans machine.

## 2011-10-09 ENCOUNTER — Encounter (HOSPITAL_COMMUNITY): Payer: Self-pay | Admitting: Gastroenterology

## 2011-10-09 ENCOUNTER — Encounter (HOSPITAL_COMMUNITY): Payer: Self-pay

## 2011-10-09 LAB — TYPE AND SCREEN
ABO/RH(D): O POS
Antibody Screen: NEGATIVE
Unit division: 0
Unit division: 0

## 2011-10-09 LAB — HEMOGLOBIN AND HEMATOCRIT, BLOOD
HCT: 24.2 % — ABNORMAL LOW (ref 39.0–52.0)
HCT: 26.2 % — ABNORMAL LOW (ref 39.0–52.0)
Hemoglobin: 8.4 g/dL — ABNORMAL LOW (ref 13.0–17.0)
Hemoglobin: 9.2 g/dL — ABNORMAL LOW (ref 13.0–17.0)
Hemoglobin: 9.5 g/dL — ABNORMAL LOW (ref 13.0–17.0)

## 2011-10-09 LAB — CBC
HCT: 26.2 % — ABNORMAL LOW (ref 39.0–52.0)
Hemoglobin: 8.6 g/dL — ABNORMAL LOW (ref 13.0–17.0)
MCH: 29.9 pg (ref 26.0–34.0)
MCH: 30.1 pg (ref 26.0–34.0)
MCH: 30.5 pg (ref 26.0–34.0)
MCHC: 34.5 g/dL (ref 30.0–36.0)
MCV: 86.8 fL (ref 78.0–100.0)
MCV: 88.3 fL (ref 78.0–100.0)
Platelets: 205 10*3/uL (ref 150–400)
Platelets: 207 10*3/uL (ref 150–400)
RBC: 2.88 MIL/uL — ABNORMAL LOW (ref 4.22–5.81)
RBC: 3.02 MIL/uL — ABNORMAL LOW (ref 4.22–5.81)
RDW: 17.3 % — ABNORMAL HIGH (ref 11.5–15.5)
WBC: 8.7 10*3/uL (ref 4.0–10.5)
WBC: 6.8 10*3/uL (ref 4.0–10.5)

## 2011-10-09 LAB — RETICULOCYTES
RBC.: 3.29 MIL/uL — ABNORMAL LOW (ref 4.22–5.81)
Retic Count, Absolute: 207.3 10*3/uL — ABNORMAL HIGH (ref 19.0–186.0)
Retic Ct Pct: 6.3 % — ABNORMAL HIGH (ref 0.4–3.1)

## 2011-10-09 LAB — COMPREHENSIVE METABOLIC PANEL
AST: 11 U/L (ref 0–37)
Albumin: 2.5 g/dL — ABNORMAL LOW (ref 3.5–5.2)
BUN: 29 mg/dL — ABNORMAL HIGH (ref 6–23)
Creatinine, Ser: 0.97 mg/dL (ref 0.50–1.35)
Potassium: 3.5 mEq/L (ref 3.5–5.1)
Total Protein: 4.4 g/dL — ABNORMAL LOW (ref 6.0–8.3)

## 2011-10-09 LAB — CARDIAC PANEL(CRET KIN+CKTOT+MB+TROPI)
CK, MB: 1.3 ng/mL (ref 0.3–4.0)
Relative Index: INVALID (ref 0.0–2.5)
Relative Index: INVALID (ref 0.0–2.5)
Total CK: 43 U/L (ref 7–232)
Troponin I: <0.3 ng/mL (ref <0.30)

## 2011-10-09 LAB — HEMOGLOBIN A1C
Hgb A1c MFr Bld: 4.9 % (ref <5.7)
Mean Plasma Glucose: 94 mg/dL (ref <117)

## 2011-10-09 LAB — IRON AND TIBC: Iron: <10 ug/dL — ABNORMAL LOW (ref 42–135)

## 2011-10-09 MED ORDER — PANTOPRAZOLE SODIUM 40 MG PO TBEC
40.0000 mg | DELAYED_RELEASE_TABLET | Freq: Two times a day (BID) | ORAL | Status: DC
  Administered 2011-10-09 – 2011-10-10 (×3): 40 mg via ORAL
  Filled 2011-10-09 (×5): qty 1

## 2011-10-09 MED ORDER — FERUMOXYTOL INJECTION 510 MG/17 ML
510.0000 mg | Freq: Once | INTRAVENOUS | Status: AC
  Administered 2011-10-09: 510 mg via INTRAVENOUS
  Filled 2011-10-09: qty 17

## 2011-10-09 NOTE — ED Notes (Signed)
Lab bedside to obtain AM labs

## 2011-10-09 NOTE — ED Notes (Signed)
CBG registered 82 on ED Glucometer 

## 2011-10-09 NOTE — ED Notes (Signed)
Care of pt assumed. Pt denies any complaints at this time. Protonix and NS infusing well per orders. Pt updated on plan of care and bed assignment. Will continue to monitor.

## 2011-10-09 NOTE — ED Notes (Signed)
Called report to 2W. RN sending mesg to [facility] to verify that pt still needs stepdown bed. Will wait to hear back before transferring pt.

## 2011-10-09 NOTE — ED Notes (Signed)
Attempted to call report to 2W. RN unavailable, to return call in 5 minutes.

## 2011-10-09 NOTE — ED Notes (Addendum)
Amy, [facility] PA at bedside.

## 2011-10-09 NOTE — ED Notes (Signed)
icu rn called admitting md wants to change pt's bed to tele

## 2011-10-09 NOTE — Progress Notes (Signed)
Subjective: Feeling better; reports melanotic stool X 1 this afternoon; no abdominal pain; no nausea and no vomiting.  Objective: Vital signs in last 24 hours: Temp:  [98 F (36.7 C)-99 F (37.2 C)] 98.7 F (37.1 C) (04/10 1714) Pulse Rate:  [77-115] 95  (04/10 1714) Resp:  [2-21] 16  (04/10 1714) BP: (90-132)/(49-81) 115/78 mmHg (04/10 1714) SpO2:  [95 %-100 %] 99 % (04/10 1714) Weight:  [83.915 kg (185 lb)-84.052 kg (185 lb 4.8 oz)] 83.915 kg (185 lb) (04/10 1714) Weight change:  Last BM Date: 10/09/11 (per pt last bm black)  Intake/Output from previous day: 04/09 0701 - 04/10 0700 In: 2702.5 [I.V.:2000; Blood:702.5] Out: 1000 [Urine:1000] Total I/O In: -  Out: 3380 [Urine:3380]   Physical Exam: General: Alert, awake, oriented x3, in no acute distress. HEENT: No bruits, no goiter. Heart: Regular rate and rhythm, without murmurs, rubs, gallops. Lungs: Clear to auscultation bilaterally. Abdomen: Soft, nontender, nondistended, positive bowel sounds. Extremities: No clubbing, cyanosis or edema with positive pedal pulses. Neuro: Grossly intact, nonfocal.  Lab Results: Basic Metabolic Panel:  Basename 10/09/11 0335 10/08/11 1908  NA 140 136  K 3.5 4.1  CL 112 111  CO2 23 20  GLUCOSE 86 91  BUN 29* 38*  CREATININE 0.97 0.83  CALCIUM 7.1* 6.7*  MG -- 1.7  PHOS -- 2.4   Liver Function Tests:  Basename 10/09/11 0335 10/08/11 1908  AST 11 16  ALT 12 11  ALKPHOS 45 42  BILITOT 0.5 0.3  PROT 4.4* 4.3*  ALBUMIN 2.5* 2.4*   CBC:  Basename 10/09/11 1439 10/09/11 1050 10/09/11 0335 10/08/11 1908 10/08/11 1220  WBC -- 6.8 8.7 -- --  NEUTROABS -- -- -- 5.2 7.3  HGB 9.2* 9.1* -- -- --  HCT 26.2* 26.2* -- -- --  MCV -- 86.8 86.8 -- --  PLT -- 177 205 -- --   Cardiac Enzymes:  Basename 10/09/11 1050 10/09/11 0335 10/08/11 1908  CKTOTAL 51 43 49  CKMB 1.3 1.3 1.2  CKMBINDEX -- -- --  TROPONINI <0.30 <0.30 <0.30   CBG:  Basename 10/09/11 0809  GLUCAP 82    Hemoglobin A1C:  Basename 10/08/11 1908  HGBA1C 4.9   Thyroid Function Tests:  Basename 10/08/11 1908  TSH 1.163  T4TOTAL --  FREET4 --  T3FREE --  THYROIDAB --   Coagulation:  Basename 10/08/11 1908 10/08/11 1220  LABPROT 16.1* 15.9*  INR 1.26 1.24    Studies/Results: No results found.  Medications: Scheduled Meds:   . pantoprazole  40 mg Oral BID AC  . polyethylene glycol  17 g Oral Daily  . sodium chloride  3 mL Intravenous Q12H  . DISCONTD: pantoprazole (PROTONIX) IV  40 mg Intravenous Q12H   Continuous Infusions:   . sodium chloride 100 mL/hr at 10/09/11 0108  . DISCONTD: pantoprozole (PROTONIX) infusion Stopped (10/09/11 1231)   PRN Meds:.acetaminophen, acetaminophen, ondansetron (ZOFRAN) IV, ondansetron, senna-docusate  Assessment/Plan: 1-Acute blood loss anemia: s/p 2 units of PRBC's; will continue tracking Hgb level and transfuse if less than 9.0; per endoscopy results patient with severe erosive esophagitis. Will continue PPI BID and will follow Gi recommendations.  2-Upper GI bleed 2/2 erosive esophagitis: will continue PPI BID; diet advance per GI today; patient doing better. Has received 2 units of PRBC's; will follow Hgb trend and will transfuse iron per pharmacy.  3-tachycardia/palpitations: 2/2 to anemia; cardiac enzymes negative X 3; after transfusion denies any chest pain. Will follow symptomatically. Continue PPI. Erosive esophagitis most likely contributing  to his symptoms as well (especially CP).  4-Constipation: continue miralax.  5-DVT: SCD's.   LOS: 1 day   [name] [facility] [phone]  10/09/2011, 6:11 PM

## 2011-10-09 NOTE — ED Notes (Signed)
Pt given orange juice, cran-grape juice and italian ice as per full liquid diet orders. Pt updated on bed assignment.

## 2011-10-09 NOTE — Progress Notes (Signed)
MEDICATION RELATED CONSULT NOTE - INITIAL   Pharmacy Consult for IV Iron Indication: anemia  No Known Allergies  Patient Measurements: Height: 5\' 9"  (175.3 cm) Weight: 185 lb (83.915 kg) IBW/kg (Calculated) : 70.7    Vital Signs: Temp: 98.7 F (37.1 C) (04/10 1714) Temp src: Oral (04/10 1714) BP: 115/78 mmHg (04/10 1714) Pulse Rate: 95  (04/10 1714) Intake/Output from previous day: 04/09 0701 - 04/10 0700 In: 2702.5 [I.V.:2000; Blood:702.5] Out: 1000 [Urine:1000] Intake/Output from this shift: Total I/O In: -  Out: 3380 [Urine:3380]  Labs:  Basename 10/09/11 1439 10/09/11 1050 10/09/11 0634 10/09/11 0335 10/08/11 1908 10/08/11 1220  WBC -- 6.8 -- 8.7 8.9 --  HGB 9.2* 9.1* 8.4* -- -- --  HCT 26.2* 26.2* 24.2* -- -- --  PLT -- 177 -- 205 203 --  APTT -- -- -- -- 27 28  CREATININE -- -- -- 0.97 0.83 0.96  LABCREA -- -- -- -- -- --  CREATININE -- -- -- 0.97 0.83 0.96  CREAT24HRUR -- -- -- -- -- --  MG -- -- -- -- 1.7 --  PHOS -- -- -- -- 2.4 --  ALBUMIN -- -- -- 2.5* 2.4* --  PROT -- -- -- 4.4* 4.3* --  ALBUMIN -- -- -- 2.5* 2.4* --  AST -- -- -- 11 16 --  ALT -- -- -- 12 11 --  ALKPHOS -- -- -- 45 42 --  BILITOT -- -- -- 0.5 0.3 --  BILIDIR -- -- -- -- -- --  IBILI -- -- -- -- -- --   Estimated Creatinine Clearance: 83 ml/min (by C-G formula based on Cr of 0.97).   Microbiology: No results found for this or any previous visit (from the past 720 hour(s)).  Medical History: Past Medical History  Diagnosis Date  . GERD (gastroesophageal reflux disease)   . GI bleed   . Diverticulosis   . Colon polyps     Dr Blossom Hoops    Medications:  Scheduled:    . ferumoxytol  510 mg Intravenous Once  . pantoprazole  40 mg Oral BID AC  . polyethylene glycol  17 g Oral Daily  . sodium chloride  3 mL Intravenous Q12H  . DISCONTD: pantoprazole (PROTONIX) IV  40 mg Intravenous Q12H   Infusions:    . sodium chloride 100 mL/hr at 10/09/11 0108  . DISCONTD:  pantoprozole (PROTONIX) infusion Stopped (10/09/11 1231)   PRN: acetaminophen, acetaminophen, ondansetron (ZOFRAN) IV, ondansetron, senna-docusate  Assessment: 61 YO M w/ anemia. Pharmacy asked to dose IV Iron  Goal of Therapy:  Correct dose of IV Iron  Plan:   Feraheme 510mg  IV x 1 dose  Will check baseline anemia panel  Repeat dose 3-8 days if necessary  Gwen Her PharmD  [phone] 10/09/2011 6:35 PM

## 2011-10-09 NOTE — Progress Notes (Signed)
I have taken an interval history, reviewed the chart and examined the patient. I agree with the extender's note, impression and recommendations. No evidence of recurrent bleeding. PPI BID for the long term treatment of severe erosive esophagitis.  Venita Lick. Russella Dar MD Clementeen Graham

## 2011-10-09 NOTE — Progress Notes (Signed)
Patient ID: [name], male   DOB: [birth year], 61 y.o.   MRN: [id number] [facility] Progress Note  Subjective: Feeling better, no further bleeding, no BM's.  He denies dizziness today.  He does mention today theat he had been having some intermittent chest discomfort, and pain in his lower jaw over the past few days-none last night or today. Objective:  Vital signs in last 24 hours: Temp:  [98 F (36.7 C)-99.5 F (37.5 C)] 99 F (37.2 C) (04/09 2215) Pulse Rate:  [77-115] 95  (04/10 1118) Resp:  [2-36] 18  (04/10 1118) BP: (90-136)/(49-88) 127/69 mmHg (04/10 1118) SpO2:  [95 %-100 %] 100 % (04/10 1118) Weight:  [188 lb (85.276 kg)] 188 lb (85.276 kg) (04/09 1452)   General:   Alert,  Well-developed,    in NAD Heart:  Regular rate and rhythm; no murmurs Pulm;clear Abdomen:  Soft, nontender and nondistended. Normal bowel sounds, without guarding, and without rebound.   Extremities:  Without edema. Neurologic:  Alert and  oriented x4;  grossly normal neurologically. Psych:  Alert and cooperative. Normal mood and affect.  Intake/Output from previous day: 04/09 0701 - 04/10 0700 In: 2702.5 [I.V.:2000; Blood:702.5] Out: 1000 [Urine:1000] Intake/Output this shift: Total I/O In: -  Out: 2180 [Urine:2180]  Lab Results:  [facility] 10/09/11 1050 10/09/11 0634 10/09/11 0335 10/08/11 1908  WBC 6.8 -- 8.7 8.9  HGB 9.1* 8.4* 8.6* --  HCT 26.2* 24.2* 25.0* --  PLT 177 -- 205 203   BMET  Basename 10/09/11 0335 10/08/11 1908 10/08/11 1220  NA 140 136 138  K 3.5 4.1 4.3  CL 112 111 109  CO2 23 20 24   GLUCOSE 86 91 100*  BUN 29* 38* 33*  CREATININE 0.97 0.83 0.96  CALCIUM 7.1* 6.7* 7.4*   LFT  Basename 10/09/11 0335  PROT 4.4*  ALBUMIN 2.5*  AST 11  ALT 12  ALKPHOS 45  BILITOT 0.5  BILIDIR --  IBILI --   PT/INR  Basename 10/08/11 1908 10/08/11 1220  LABPROT 16.1* 15.9*  INR 1.26 1.24   Hepatitis Panel No results found for this basename:  HEPBSAG,HCVAB,HEPAIGM,HEPBIGM in the last 72 hours  Assessment / Plan: 61 yo male with acute/subacute GI bleeding secondary to severe erosive esophagitis in setting of large hiatal hernia. He is stable, no obvious active bleeding Continue Bid PPI Advance to full liquid diet today #2 s/p Nissen fundoplication-this has loosened #3 anemia-secondary to blood loss- stable post transfusion- would keep hgb in 9 range as he did not tolerate lower #4 recent chest pain and jaw pain- ? Demand ischemia in setting of anemia- defer to medicine to workup #5 Anxiety,significant  Principal Problem:  *Acute blood loss anemia Active Problems:  Esophagitis, erosive  Tachycardia  Upper GI bleed     LOS: 1 day   Dominiq Fontaine  10/09/2011, 11:53 AM

## 2011-10-09 NOTE — Progress Notes (Signed)
Agree with Ms. Guenther's assessment and plan. Perrion Diesel E. Addaleigh Nicholls, MD, FACG   

## 2011-10-10 ENCOUNTER — Other Ambulatory Visit: Payer: Self-pay

## 2011-10-10 DIAGNOSIS — D62 Acute posthemorrhagic anemia: Secondary | ICD-10-CM

## 2011-10-10 LAB — CBC
Platelets: 239 10*3/uL (ref 150–400)
Platelets: 277 10*3/uL (ref 150–400)
RBC: 3.24 MIL/uL — ABNORMAL LOW (ref 4.22–5.81)
RDW: 17 % — ABNORMAL HIGH (ref 11.5–15.5)
WBC: 7.4 10*3/uL (ref 4.0–10.5)
WBC: 6.7 10*3/uL (ref 4.0–10.5)

## 2011-10-10 LAB — FERRITIN: Ferritin: 25 ng/mL (ref 22–322)

## 2011-10-10 LAB — GLUCOSE, CAPILLARY: Glucose-Capillary: 93 mg/dL (ref 70–99)

## 2011-10-10 MED ORDER — FERROUS GLUCONATE 324 (38 FE) MG PO TABS: 324.0000 mg | ORAL_TABLET | Freq: Two times a day (BID) | ORAL | Status: DC

## 2011-10-10 MED ORDER — PANTOPRAZOLE SODIUM 40 MG PO TBEC: 40.0000 mg | DELAYED_RELEASE_TABLET | Freq: Two times a day (BID) | ORAL | Status: DC

## 2011-10-10 NOTE — Progress Notes (Signed)
CARE MANAGEMENT NOTE 10/10/2011  Patient:  [name], [name]   Account Number:  [id number]  Date Initiated:  10/10/2011  Documentation initiated by:  Mariaisabel Bodiford  Subjective/Objective Assessment:   pt admitted with upper gi bleed and bloody stools, hgb 8.1, endoscopy erformed and transfused     Action/Plan:   lives at home with spouse   Anticipated DC Date:  10/13/2011   Anticipated DC Plan:  HOME/SELF CARE  In-house referral  NA  NA  NA      DC Planning Services  NA      PAC Choice  NA   Choice offered to / List presented to:  NA   DME arranged  NA      DME agency  NA     HH arranged  NA      HH agency  NA   Status of service:  In process, will continue to follow Medicare Important Message given?  NA - LOS <3 / Initial given by admissions (If response is "NO", the following Medicare IM given date fields will be blank) Date Medicare IM given:   Date Additional Medicare IM given:    Discharge Disposition:    Per UR Regulation:  Reviewed for med. necessity/level of care/duration of stay  If discussed at Long Length of Stay Meetings, dates discussed:    Comments:  [id number]/Reubin Bushnell,RN,BSN,CCM

## 2011-10-10 NOTE — Discharge Instructions (Signed)
Iron Deficiency Anemia  Anemia is when you have a low number of healthy red blood cells. HOME CARE   Ask your doctor or dietician what foods you should eat.   Take iron and vitamins as told by your doctor.   Eat foods that have iron in them. This includes liver, lean beef, whole-grain bread, eggs, dried fruit, and dark green leafy vegetables.  GET HELP RIGHT AWAY IF:  You pass out (faint).   You have chest pain.   You feel sick to your stomach (nauseous) or throw up (vomit).   You get very short of breath with activity.   You are weak.   You are thirstier than normal.   You have a fast heartbeat.   You start to sweat or become lightheaded when getting up from a chair or bed.  MAKE SURE YOU:  Understand these instructions.   Will watch your condition.   Will get help right away if you are not doing well or get worse.

## 2011-10-10 NOTE — Discharge Summary (Signed)
Physician Discharge Summary  Patient ID: [name] MRN: [id number] DOB/AGE: 1950-08-16 61 y.o.  Admit date: 10/08/2011 Discharge date: 10/10/2011  Primary Care Physician:  Judie Petit, MD, MD   Discharge Diagnoses:   1-Acute posthemorrhagic anemia 2-Esophagitis, erosive 3-Tachycardia 4-Upper GI bleed 5-Constipation.   Medication List  As of 10/10/2011  1:34 PM   STOP taking these medications         docusate sodium 100 MG capsule      polyethylene glycol powder powder         TAKE these medications         ferrous gluconate 324 MG tablet   Commonly known as: FERGON   Take 1 tablet (324 mg total) by mouth 2 (two) times daily.      pantoprazole 40 MG tablet   Commonly known as: PROTONIX   Take 1 tablet (40 mg total) by mouth 2 (two) times daily before a meal.      polyethylene glycol packet   Commonly known as: MIRALAX / GLYCOLAX   Take 17 g by mouth daily.             Disposition and Follow-up:  Patient discharge in stable and improved condition; his Hgb level was above 9.2 at discharge and his Vital signs stable and w/o any orthostatic changes. Patient will follow a low acidic diet, will follow instructions for twice a day protonix and ferrous sulfate, and will follow with PCP in 2 weeks and with Dr. Russella Dar in 4-6 weeks for follow up of his erosive esophagitis. On Monday 10/14/11 patient will have follow up CBC at GI office to check Hgb trend.  Consults:   Gi (Dr. Russella Dar)   Significant Diagnostic Studies:  EGD 10/08/11 A) Severe hemorrhagic erosive esophagitis B) Hiatal hernia C) Blood clots in the stomach.   Brief H and P: 61 year-old male with multiple medical history including but not limited to GERD, reflux esophagitis, status post Nissen fundoplication in 2006, bile leak (status post cholecystectomy) that required stent placement who presented to emergency room after having continuous black stools. Patient had the same presentation 10/04/2011  at which time he was seen in [facility] ED but hemoglobin at that time was 16 and he was discharged home. CT scan done at that time showed moderate size hiatal hernia. He has been taking Protonix regularly but continued to have black stools. He was seen in GI office one day prior to the admission and at that time hemoglobin dropped to 10. He was supposed to have an endoscopy 10/10/2011 however due to complaints of dizziness and lightheadedness he decided to come to emergency room for evaluation  Hospital Course:  1-Acute posthemorrhagic anemia: secondary to severe erosive esophagitis; patient treated with IV PPI for 36-48 hours and then tx changed to PPI BID as recommended by GI specialist. Patietn received 2 units of PRBC's and IV Iron infusion during this admission. At discharge Hgb > 9.2 and stable.  2-Esophagitis, erosive: As mentioned above; per GI recommendations patient treated with PPI IV as in inpatient and then discharge on BID Protonix. He will follow with GI doctor in 4-6 weeks.  3-Tachycardia: 2/2 to anemia; resolved after fluid resuscitation and blood transfusion.  4-Upper GI bleed: Stable and no active as per GI assessment; will continue PPI and follow up as an outpatient in 4-6 weeks.  5-Constipation: continue miralax patient instructed to heep good hydration and increased fiber in his diet.  Time spent on Discharge: 40 minutes  Signed:  Coye Dawood 10/10/2011, 1:34 PM

## 2011-10-10 NOTE — Progress Notes (Signed)
[facility] Progress Note  Subjective: No recurrent GI bleeding. No GI complaints.  Objective:  Vital signs in last 24 hours: Temp:  [97.7 F (36.5 C)-98.7 F (37.1 C)] 97.7 F (36.5 C) (04/11 0634) Pulse Rate:  [84-95] 84  (04/11 0634) Resp:  [16-18] 18  (04/11 0634) BP: (115-132)/(69-81) 132/70 mmHg (04/11 0634) SpO2:  [97 %-100 %] 97 % (04/11 0634) Weight:  [183 lb 11.2 oz (83.326 kg)-185 lb 4.8 oz (84.052 kg)] 183 lb 11.2 oz (83.326 kg) (04/11 0634) Last BM Date: 10/09/11 General:   Alert,  Well-developed,  white male in NAD Heart:  Regular rate and rhythm; no murmurs Abdomen:  Soft, nontender and nondistended. Normal bowel sounds, without guarding, and without rebound.   Extremities:  Without edema. Neurologic:  Alert and  oriented x4;  grossly normal neurologically. Psych:  Alert and cooperative. Normal mood and affect.  Intake/Output from previous day: 04/10 0701 - 04/11 0700 In: 400 [I.V.:400] Out: 4130 [Urine:4130] Intake/Output this shift: Total I/O In: -  Out: 400 [Urine:400]  Lab Results:  Basename 10/10/11 0623 10/09/11 2240 10/09/11 1902 10/09/11 1050  WBC 7.4 -- 9.1 6.8  HGB 9.2* 9.5* 9.6* --  HCT 26.6* 27.7* 27.8* --  PLT 239 -- 207 177   BMET  Basename 10/09/11 0335 10/08/11 1908 10/08/11 1220  NA 140 136 138  K 3.5 4.1 4.3  CL 112 111 109  CO2 23 20 24   GLUCOSE 86 91 100*  BUN 29* 38* 33*  CREATININE 0.97 0.83 0.96  CALCIUM 7.1* 6.7* 7.4*   LFT  Basename 10/09/11 0335  PROT 4.4*  ALBUMIN 2.5*  AST 11  ALT 12  ALKPHOS 45  BILITOT 0.5  BILIDIR --  IBILI --   PT/INR  Basename 10/08/11 1908 10/08/11 1220  LABPROT 16.1* 15.9*  INR 1.26 1.24    Assessment / Plan:  1. UGI bleed secondary to severe erosive esophagitis. Convert to pantoprazole 40 mg po bid for long term treatment. Advance diet as tolerated. Long term antireflux measures. If stable should be ready for discharge tomorrow. 2. Acute blood loss anemia. Hb  stable. If stable should be ready for discharge tomorrow. 3. Low calcium-per primary service  We will sign off. OP GI follow up in 2 months for esophagitis. Follow up with PCP for routine health maintenance, anemia in 2-3 weeks.   Principal Problem:  *Acute blood loss anemia Active Problems:  Esophagitis, erosive  Tachycardia  Upper GI bleed    LOS: 2 days   Judie Petit T. Russella Dar MD [facility]  10/10/2011, 10:18 AM

## 2011-10-14 ENCOUNTER — Other Ambulatory Visit (INDEPENDENT_AMBULATORY_CARE_PROVIDER_SITE_OTHER): Payer: BC Managed Care – PPO

## 2011-10-14 DIAGNOSIS — D62 Acute posthemorrhagic anemia: Secondary | ICD-10-CM

## 2011-10-14 LAB — CBC WITH DIFFERENTIAL/PLATELET
Eosinophils Relative: 1 % (ref 0.0–5.0)
HCT: 31.8 % — ABNORMAL LOW (ref 39.0–52.0)
Hemoglobin: 10.6 g/dL — ABNORMAL LOW (ref 13.0–17.0)
Lymphocytes Relative: 28.9 % (ref 12.0–46.0)
Lymphs Abs: 2.3 10*3/uL (ref 0.7–4.0)
Monocytes Relative: 10.4 % (ref 3.0–12.0)
Neutro Abs: 4.7 10*3/uL (ref 1.4–7.7)
WBC: 7.9 10*3/uL (ref 4.5–10.5)

## 2011-10-17 ENCOUNTER — Encounter: Payer: Self-pay | Admitting: Internal Medicine

## 2011-10-23 ENCOUNTER — Encounter: Payer: Self-pay | Admitting: Internal Medicine

## 2011-11-01 ENCOUNTER — Encounter: Payer: Self-pay | Admitting: Internal Medicine

## 2011-11-01 ENCOUNTER — Ambulatory Visit (INDEPENDENT_AMBULATORY_CARE_PROVIDER_SITE_OTHER): Payer: BC Managed Care – PPO | Admitting: Internal Medicine

## 2011-11-01 VITALS — BP 116/78 | HR 80 | Temp 98.6°F | Ht 69.0 in | Wt 191.0 lb

## 2011-11-01 DIAGNOSIS — K208 Other esophagitis without bleeding: Secondary | ICD-10-CM

## 2011-11-01 DIAGNOSIS — K2210 Ulcer of esophagus without bleeding: Secondary | ICD-10-CM

## 2011-11-01 DIAGNOSIS — M79676 Pain in unspecified toe(s): Secondary | ICD-10-CM

## 2011-11-01 DIAGNOSIS — K922 Gastrointestinal hemorrhage, unspecified: Secondary | ICD-10-CM

## 2011-11-01 DIAGNOSIS — M79609 Pain in unspecified limb: Secondary | ICD-10-CM

## 2011-11-01 LAB — COMPREHENSIVE METABOLIC PANEL
ALT: 28 U/L (ref 0–53)
Albumin: 4.2 g/dL (ref 3.5–5.2)
BUN: 13 mg/dL (ref 6–23)
CO2: 27 mEq/L (ref 19–32)
Calcium: 8.9 mg/dL (ref 8.4–10.5)
Chloride: 105 mEq/L (ref 96–112)
Creatinine, Ser: 1.2 mg/dL (ref 0.4–1.5)
GFR: 67.25 mL/min (ref >60.00)
Potassium: 4.5 mEq/L (ref 3.5–5.1)

## 2011-11-01 LAB — CBC WITH DIFFERENTIAL/PLATELET
Basophils Absolute: 0 10*3/uL (ref 0.0–0.1)
Eosinophils Absolute: 0.1 10*3/uL (ref 0.0–0.7)
Hemoglobin: 13.7 g/dL (ref 13.0–17.0)
Lymphocytes Relative: 28.7 % (ref 12.0–46.0)
MCHC: 32.5 g/dL (ref 30.0–36.0)
Monocytes Relative: 10 % (ref 3.0–12.0)
Neutro Abs: 3.2 10*3/uL (ref 1.4–7.7)
Neutrophils Relative %: 58.3 % (ref 43.0–77.0)
Platelets: 279 10*3/uL (ref 150.0–400.0)
RDW: 16.4 % — ABNORMAL HIGH (ref 11.5–14.6)

## 2011-11-01 LAB — SEDIMENTATION RATE: Sed Rate: 9 mm/hr (ref 0–22)

## 2011-11-01 LAB — URIC ACID: Uric Acid, Serum: 6.9 mg/dL (ref 4.0–7.8)

## 2011-11-01 NOTE — Progress Notes (Signed)
Patient ID: [name], male   DOB: 1950-08-07, 61 y.o.   MRN: [id number] GI bleed secondary to [facility] and erosive esophagitis (note s/p HH repair in 2006). He now feels better. 'back to normal   Frequent nocturnal urination  Painful toes--has f/u with ortho/podiatry Past Medical History  Diagnosis Date  . GERD (gastroesophageal reflux disease)   . GI bleed   . Diverticulosis   . Colon polyps     Dr Blossom Hoops  . S/P laparoscopic cholecystectomy for gangrenous GB complicated by bile leak 06/07/2011    History   Social History  . Marital Status: Married    Spouse Name: N/A    Number of Children: N/A  . Years of Education: N/A   Occupational History  . Not on file.   Social History Main Topics  . Smoking status: Never Smoker   . Smokeless tobacco: Never Used  . Alcohol Use: No  . Drug Use: No  . Sexually Active: Not on file   Other Topics Concern  . Not on file   Social History Narrative  . No narrative on file    Past Surgical History  Procedure Date  . Cholecystectomy   . Nissen fundoplication   . Hernia repair   . Esophagogastroduodenoscopy 10/08/2011    Procedure: ESOPHAGOGASTRODUODENOSCOPY (EGD);  Surgeon: Meryl Dare, MD,FACG;  Location: [facility];  Service: Endoscopy;  Laterality: N/A;  . Colonoscopy     Family History  Problem Relation Age of Onset  . Colon cancer Neg Hx   . Breast cancer Maternal Aunt   . Lung cancer Mother   . Pancreatitis Father   . Esophageal cancer Paternal Uncle   . Heart disease Mother     No Known Allergies  Current Outpatient Prescriptions on File Prior to Visit  Medication Sig Dispense Refill  . ferrous gluconate (FERGON) 324 MG tablet Take 1 tablet (324 mg total) by mouth 2 (two) times daily.  30 tablet  11  . pantoprazole (PROTONIX) 40 MG tablet Take 1 tablet (40 mg total) by mouth 2 (two) times daily before a meal.  60 tablet  1  . polyethylene glycol (MIRALAX / GLYCOLAX) packet Take 17 g by mouth daily.       Marland Kitchen DISCONTD: pantoprazole (PROTONIX) 20 MG tablet Take 1 tablet (20 mg total) by mouth daily.  60 tablet  3     patient denies chest pain, shortness of breath, orthopnea. Denies lower extremity edema, abdominal pain, change in appetite, change in bowel movements. Patient denies rashes, musculoskeletal complaints. No other specific complaints in a complete review of systems.   BP 116/78  Pulse 80  Temp(Src) 98.6 F (37 C) (Oral)  Ht 5\' 9"  (1.753 m)  Wt 191 lb (86.637 kg)  BMI 28.21 kg/m2  well-developed well-nourished male in no acute distress. HEENT exam atraumatic, normocephalic, neck supple without jugular venous distention. Chest clear to auscultation cardiac exam S1-S2 are regular. Abdominal exam overweight with bowel sounds, soft and nontender. Extremities no edema. Neurologic exam is alert with a normal gait.

## 2011-11-03 NOTE — Assessment & Plan Note (Signed)
Symptoms have resolved Continue meds for now Check labs today He has f/u with GI in the next two weeks

## 2011-11-04 ENCOUNTER — Telehealth: Payer: Self-pay | Admitting: Internal Medicine

## 2011-11-04 ENCOUNTER — Other Ambulatory Visit (INDEPENDENT_AMBULATORY_CARE_PROVIDER_SITE_OTHER): Payer: BC Managed Care – PPO

## 2011-11-04 ENCOUNTER — Telehealth: Payer: Self-pay | Admitting: *Deleted

## 2011-11-04 DIAGNOSIS — D649 Anemia, unspecified: Secondary | ICD-10-CM

## 2011-11-04 LAB — POCT HEMOGLOBIN: Hemoglobin: 13.4 g/dL — AB (ref 14.1–18.1)

## 2011-11-04 NOTE — Telephone Encounter (Signed)
See result note.  

## 2011-11-04 NOTE — Telephone Encounter (Signed)
Pt returning nurse call. Pls call back at pts work #.

## 2011-11-04 NOTE — Telephone Encounter (Signed)
Pt is going out of town on Wednesday and just wanted his hgb checked one more time just for reassurance ok per Dr Cato Mulligan.  Told pt to come in and we would do a fingerstick hgb.  Pt will come in this afternoon.

## 2011-11-13 ENCOUNTER — Ambulatory Visit: Payer: BC Managed Care – PPO | Admitting: Gastroenterology

## 2011-11-19 ENCOUNTER — Encounter: Payer: Self-pay | Admitting: Gastroenterology

## 2011-11-19 ENCOUNTER — Ambulatory Visit (INDEPENDENT_AMBULATORY_CARE_PROVIDER_SITE_OTHER): Payer: BC Managed Care – PPO | Admitting: Gastroenterology

## 2011-11-19 VITALS — BP 120/80 | HR 84 | Ht 69.0 in | Wt 190.8 lb

## 2011-11-19 DIAGNOSIS — Z86010 Personal history of colonic polyps: Secondary | ICD-10-CM

## 2011-11-19 DIAGNOSIS — K2100 Gastro-esophageal reflux disease with esophagitis, without bleeding: Secondary | ICD-10-CM

## 2011-11-19 DIAGNOSIS — D62 Acute posthemorrhagic anemia: Secondary | ICD-10-CM

## 2011-11-19 NOTE — Patient Instructions (Addendum)
You will be due for a recall Endoscopy/Colonoscopy in 05/2012. We will send you a reminder in the mail when it gets closer to that time. cc: Birdie Sons, MD

## 2011-11-19 NOTE — Progress Notes (Signed)
History of Present Illness: This is a 61 year old male returning for followup after hospitalization for upper GI bleed secondary to ulcerative esophagitis. He is now maintained on pantoprazole 40 mg twice daily and reports only an occasional twinge of lower sternal burning. His post hemorrhagic anemia has corrected with his most recent hemoglobin=13.7. He previously underwent colonoscopy by Dr. Corinda Gubler with as small polyp removed in 11/2006 and no record of a pathology report. He notes occasional dark areas in his stool.  Current Medications, Allergies, Past Medical History, Past Surgical History, Family History and Social History were reviewed in [employer] record.  Physical Exam: General: Well developed , well nourished, no acute distress Head: Normocephalic and atraumatic Eyes:  sclerae anicteric, EOMI Ears: Normal auditory acuity Mouth: No deformity or lesions Lungs: Clear throughout to auscultation Heart: Regular rate and rhythm; no murmurs, rubs or bruits Abdomen: Soft, non tender and non distended. No masses, hepatosplenomegaly or hernias noted. Normal Bowel sounds Musculoskeletal: Symmetrical with no gross deformities  Pulses:  Normal pulses noted Extremities: No clubbing, cyanosis, edema or deformities noted Neurological: Alert oriented x 4, grossly nonfocal Psychological:  Alert and cooperative. Normal mood and affect  Assessment and Recommendations:  1. Erosive esophagitis. Continue PPI BID long term. EGD to document healing of esophagitis at [facility] in 6 months.  2. Post hemorrhagic anemia-resolved.  3. History of a colon polyp in 11/2006-no record in pathology of polyp. Surveillance colonoscopy in 5 years at time of repeat EGD. Will arrange at his return office visit.

## 2011-12-09 ENCOUNTER — Other Ambulatory Visit: Payer: Self-pay | Admitting: Gastroenterology

## 2011-12-09 MED ORDER — PANTOPRAZOLE SODIUM 40 MG PO TBEC: 40.0000 mg | DELAYED_RELEASE_TABLET | Freq: Two times a day (BID) | ORAL | Status: DC

## 2011-12-09 NOTE — Telephone Encounter (Signed)
Sent the prescription to patient's pharmacy.

## 2012-05-15 ENCOUNTER — Telehealth: Payer: Self-pay | Admitting: Nurse Practitioner

## 2012-05-15 ENCOUNTER — Emergency Department (HOSPITAL_COMMUNITY)
Admission: EM | Admit: 2012-05-15 | Discharge: 2012-05-15 | Disposition: A | Discharge status: Home / Self Care | Payer: BC Managed Care – PPO | Attending: Emergency Medicine | Admitting: Emergency Medicine

## 2012-05-15 ENCOUNTER — Encounter (HOSPITAL_COMMUNITY): Payer: Self-pay | Admitting: Family Medicine

## 2012-05-15 DIAGNOSIS — K5730 Diverticulosis of large intestine without perforation or abscess without bleeding: Secondary | ICD-10-CM | POA: Insufficient documentation

## 2012-05-15 DIAGNOSIS — Z860100 Personal history of colon polyps, unspecified: Secondary | ICD-10-CM | POA: Insufficient documentation

## 2012-05-15 DIAGNOSIS — R42 Dizziness and giddiness: Secondary | ICD-10-CM | POA: Insufficient documentation

## 2012-05-15 DIAGNOSIS — Z9089 Acquired absence of other organs: Secondary | ICD-10-CM | POA: Insufficient documentation

## 2012-05-15 DIAGNOSIS — K2970 Gastritis, unspecified, without bleeding: Secondary | ICD-10-CM

## 2012-05-15 DIAGNOSIS — Z79899 Other long term (current) drug therapy: Secondary | ICD-10-CM | POA: Insufficient documentation

## 2012-05-15 DIAGNOSIS — K219 Gastro-esophageal reflux disease without esophagitis: Secondary | ICD-10-CM | POA: Insufficient documentation

## 2012-05-15 LAB — COMPREHENSIVE METABOLIC PANEL
ALT: 15 U/L (ref 0–53)
AST: 19 U/L (ref 0–37)
Albumin: 3.7 g/dL (ref 3.5–5.2)
CO2: 22 mEq/L (ref 19–32)
Chloride: 104 mEq/L (ref 96–112)
Creatinine, Ser: 1.09 mg/dL (ref 0.50–1.35)
GFR calc non Af Amer: 72 mL/min — ABNORMAL LOW (ref >90)
Sodium: 139 mEq/L (ref 135–145)
Total Bilirubin: 0.2 mg/dL — ABNORMAL LOW (ref 0.3–1.2)

## 2012-05-15 LAB — CBC WITH DIFFERENTIAL/PLATELET
Basophils Absolute: 0 10*3/uL (ref 0.0–0.1)
Basophils Relative: 1 % (ref 0–1)
Lymphocytes Relative: 32 % (ref 12–46)
MCHC: 35.1 g/dL (ref 30.0–36.0)
Monocytes Absolute: 0.8 10*3/uL (ref 0.1–1.0)
Neutro Abs: 4.5 10*3/uL (ref 1.7–7.7)
Neutrophils Relative %: 56 % (ref 43–77)
Platelets: 256 10*3/uL (ref 150–400)
RDW: 13.2 % (ref 11.5–15.5)
WBC: 7.9 10*3/uL (ref 4.0–10.5)

## 2012-05-15 MED ORDER — SUCRALFATE 1 G PO TABS: 1.0000 g | ORAL_TABLET | Freq: Four times a day (QID) | ORAL | Status: DC

## 2012-05-15 MED ORDER — SODIUM CHLORIDE 0.9 % IV BOLUS (SEPSIS)
1000.0000 mL | Freq: Once | INTRAVENOUS | Status: AC
  Administered 2012-05-15: 1000 mL via INTRAVENOUS

## 2012-05-15 NOTE — Telephone Encounter (Signed)
Patient reports black stools for the last 2 days.  He also c/o abdominal pain and increased GERD for the last month or so.  He has a history of upper GI bleed.  He is due for repeat endo/colon now to check for healing of esophagitis from April.  He will come in and see Dr. Russella Dar on Monday at 9:15

## 2012-05-15 NOTE — ED Notes (Signed)
Pt presents to the ED with a complaint of syncope and tarry stools. Pt has a history of GI bleed.   Pt states he has been in the doctors office and hospital all of this week.  Pt c/o of dizziness tonight and some small pain in the abdomen.  Pt c/o acid reflux.

## 2012-05-15 NOTE — ED Provider Notes (Signed)
History     CSN: [id number]  Arrival date & time 05/15/12  2035   First MD Initiated Contact with Patient 05/15/12 2110      Chief Complaint  Patient presents with  . GI Bleeding    (Consider location/radiation/quality/duration/timing/severity/associated sxs/prior treatment) HPI Comments: Patient has a history of a GI bleed due to erosive esophagitis this past April. This required blood transfusions. He presents today with a two-day history of dark tarry stools although he said his last was not dark and tarry in nature. Patient denies any abdominal pain. He denies any nausea or vomiting. Denies any gross blood in his stool. He has been feeling dizzy and lightheaded today. Denies any vertiginous-type symptoms. He denies any chest pain or shortness of breath. He denies any urinary symptoms. He feels like his symptoms and have been worsening since yesterday   Past Medical History  Diagnosis Date  . GERD (gastroesophageal reflux disease)   . GI bleed   . Diverticulosis   . Colon polyps     Dr Blossom Hoops  . S/P laparoscopic cholecystectomy for gangrenous GB complicated by bile leak 06/07/2011  . Erosive esophagitis     Past Surgical History  Procedure Date  . Cholecystectomy   . Nissen fundoplication   . Hernia repair   . Esophagogastroduodenoscopy 10/08/2011    Procedure: ESOPHAGOGASTRODUODENOSCOPY (EGD);  Surgeon: Meryl Dare, MD,FACG;  Location: [facility];  Service: Endoscopy;  Laterality: N/A;  . Colonoscopy     Family History  Problem Relation Age of Onset  . Colon cancer Neg Hx   . Breast cancer Maternal Aunt   . Lung cancer Mother   . Pancreatitis Father   . Esophageal cancer Paternal Uncle   . Heart disease Mother     History  Substance Use Topics  . Smoking status: Never Smoker   . Smokeless tobacco: Never Used  . Alcohol Use: No     Comment: Occasional      Review of Systems  Constitutional: Negative for fever, chills, diaphoresis and fatigue.    HENT: Negative for congestion, rhinorrhea and sneezing.   Eyes: Negative.   Respiratory: Negative for cough, chest tightness and shortness of breath.   Cardiovascular: Negative for chest pain and leg swelling.  Gastrointestinal: Negative for nausea, vomiting, abdominal pain, diarrhea and blood in stool.  Genitourinary: Negative for frequency, hematuria, flank pain and difficulty urinating.  Musculoskeletal: Negative for back pain and arthralgias.  Skin: Negative for rash.  Neurological: Positive for dizziness. Negative for speech difficulty, weakness, numbness and headaches.    Allergies  Review of patient's allergies indicates no known allergies.  Home Medications   Current Outpatient Rx  Name  Route  Sig  Dispense  Refill  . PANTOPRAZOLE SODIUM 40 MG PO TBEC   Oral   Take 1 tablet (40 mg total) by mouth 2 (two) times daily before a meal.   60 tablet   11     BP 151/83  Pulse 82  Temp 98.4 F (36.9 C) (Oral)  Resp 18  SpO2 100%  Physical Exam  Constitutional: He is oriented to person, place, and time. He appears well-developed and well-nourished.  HENT:  Head: Normocephalic and atraumatic.  Mouth/Throat: Oropharynx is clear and moist.  Eyes: Pupils are equal, round, and reactive to light.  Neck: Normal range of motion. Neck supple.  Cardiovascular: Normal rate, regular rhythm and normal heart sounds.   Pulmonary/Chest: Effort normal and breath sounds normal. No respiratory distress. He has no wheezes. He  has no rales. He exhibits no tenderness.  Abdominal: Soft. Bowel sounds are normal. There is no tenderness. There is no rebound and no guarding.  Genitourinary:       No gross blood, no melena.  +hemoccult  Musculoskeletal: Normal range of motion. He exhibits no edema.  Lymphadenopathy:    He has no cervical adenopathy.  Neurological: He is alert and oriented to person, place, and time.  Skin: Skin is warm and dry. No rash noted.  Psychiatric: He has a normal mood  and affect.    ED Course  Procedures (including critical care time)  Results for orders placed during the hospital encounter of 05/15/12  CBC WITH DIFFERENTIAL      Component Value Range   WBC 7.9  4.0 - 10.5 K/uL   RBC 4.82  4.22 - 5.81 MIL/uL   Hemoglobin 14.9  13.0 - 17.0 g/dL   HCT 29.5  62.1 - 30.8 %   MCV 88.0  78.0 - 100.0 fL   MCH 30.9  26.0 - 34.0 pg   MCHC 35.1  30.0 - 36.0 g/dL   RDW 65.7  84.6 - 96.2 %   Platelets 256  150 - 400 K/uL   Neutrophils Relative 56  43 - 77 %   Neutro Abs 4.5  1.7 - 7.7 K/uL   Lymphocytes Relative 32  12 - 46 %   Lymphs Abs 2.5  0.7 - 4.0 K/uL   Monocytes Relative 10  3 - 12 %   Monocytes Absolute 0.8  0.1 - 1.0 K/uL   Eosinophils Relative 2  0 - 5 %   Eosinophils Absolute 0.1  0.0 - 0.7 K/uL   Basophils Relative 1  0 - 1 %   Basophils Absolute 0.0  0.0 - 0.1 K/uL  COMPREHENSIVE METABOLIC PANEL      Component Value Range   Sodium 139  135 - 145 mEq/L   Potassium 3.9  3.5 - 5.1 mEq/L   Chloride 104  96 - 112 mEq/L   CO2 22  19 - 32 mEq/L   Glucose, Bld 111 (*) 70 - 99 mg/dL   BUN 15  6 - 23 mg/dL   Creatinine, Ser 9.52  0.50 - 1.35 mg/dL   Calcium 8.6  8.4 - 84.1 mg/dL   Total Protein 7.0  6.0 - 8.3 g/dL   Albumin 3.7  3.5 - 5.2 g/dL   AST 19  0 - 37 U/L   ALT 15  0 - 53 U/L   Alkaline Phosphatase 79  39 - 117 U/L   Total Bilirubin 0.2 (*) 0.3 - 1.2 mg/dL   GFR calc non Af Amer 72 (*) >90 mL/min   GFR calc Af Amer 84 (*) >90 mL/min   No results found.  No results found.  Date: 05/15/2012  Rate: 80  Rhythm: normal sinus rhythm  QRS Axis: normal  Intervals: normal  ST/T Wave abnormalities: normal  Conduction Disutrbances:none  Narrative Interpretation:   Old EKG Reviewed: unchanged    1. Gastritis       MDM  Pt with hx of GI bleed.  Hgb okay today.  Stool normal color, heme +.  No gross blood or melena.  Has appt with Dr Russella Dar on Monday.  Discussed with Dr Marina Goodell who is comfortable with pt going home.  Stressed  importance of taking his protonix.  Return if symptoms worsen.        Rolan Bucco, MD 05/15/12 2252

## 2012-05-15 NOTE — ED Notes (Signed)
Patient states that he has had dark, tarry stool since yesterday. Has felt dizzy and like he is going to pass out. Has had a couple of episodes of abdominal pain.  Has history of severe erosive esophagitis. Has been admitted for the same back in April.

## 2012-05-18 ENCOUNTER — Encounter: Payer: Self-pay | Admitting: Gastroenterology

## 2012-05-18 ENCOUNTER — Ambulatory Visit (INDEPENDENT_AMBULATORY_CARE_PROVIDER_SITE_OTHER): Payer: BC Managed Care – PPO | Admitting: Gastroenterology

## 2012-05-18 VITALS — BP 112/74 | HR 92 | Ht 69.0 in | Wt 191.6 lb

## 2012-05-18 DIAGNOSIS — K2100 Gastro-esophageal reflux disease with esophagitis, without bleeding: Secondary | ICD-10-CM

## 2012-05-18 DIAGNOSIS — R195 Other fecal abnormalities: Secondary | ICD-10-CM

## 2012-05-18 MED ORDER — PEG-KCL-NACL-NASULF-NA ASC-C 100 G PO SOLR: 1.0000 | Freq: Once | ORAL | Status: DC

## 2012-05-18 MED ORDER — RANITIDINE HCL 300 MG PO TABS: 300.0000 mg | ORAL_TABLET | Freq: Every day | ORAL | Status: DC

## 2012-05-18 NOTE — Progress Notes (Signed)
History of Present Illness: This is a 61 year old male with a history of LA class C. esophagitis and a history of GI bleeding from esophagitis. He's been having some nighttime breakthrough reflux symptoms that he treated with Pepto-Bismol and several days later noted dark stools. He also occasionally takes Alka-Seltzer for breakthrough symptoms. He was evaluated in the emergency department and found to have a normal hemoglobin and brown Hemoccult positive stool. He was started on Carafate at the emergency room visit and his nighttime heartburn has improved. He has no other gastrointestinal complaints.  Current Medications, Allergies, Past Medical History, Past Surgical History, Family History and Social History were reviewed in [employer] record.  Physical Exam: General: Well developed , well nourished, no acute distress Head: Normocephalic and atraumatic Eyes:  sclerae anicteric, EOMI Ears: Normal auditory acuity Mouth: No deformity or lesions Lungs: Clear throughout to auscultation Heart: Regular rate and rhythm; no murmurs, rubs or bruits Abdomen: Soft, non tender and non distended. No masses, hepatosplenomegaly or hernias noted. Normal Bowel sounds Rectal: Deferred to colonoscopy, recent Hemoccult-positive in the ED Musculoskeletal: Symmetrical with no gross deformities  Pulses:  Normal pulses noted Extremities: No clubbing, cyanosis, edema or deformities noted Neurological: Alert oriented x 4, grossly nonfocal Psychological:  Alert and cooperative. Normal mood and affect  Assessment and Recommendations:  1. Hemoccult-positive stool. History of LA class C esophagitis. History of small polyps on last colonoscopy in 2008 without pathology sent. He has a history of a Nissen fundoplication that has failed. I suspect his occult bleeding is from esophagitis. Rule out colorectal neoplasms. Schedule colonoscopy and upper endoscopy for further evaluation. He is somewhat  reluctant to proceed with colonoscopy however he appears to be agreeable and understands the potential risk of colon polyps, colon cancer and other colonic diseases that may contribute Hemoccult-positive stool. Change Carafate to when necessary. Continue pantoprazole 40 mg twice a day and add ranitidine 300 mg at bedtime. May use Maalox or TUMS when necessary. Avoid Pepto-Bismol and Alka-Seltzer. Closely follow all antireflux measures. The risks, benefits, and alternatives to colonoscopy with possible biopsy and possible polypectomy were discussed with the patient and they consent to proceed. The risks, benefits, and alternatives to endoscopy with possible biopsy and possible dilation were discussed with the patient and they consent to proceed. The risks, benefits, and alternatives to endoscopy with possible biopsy and possible dilation were discussed with the patient and they consent to proceed.

## 2012-05-18 NOTE — Patient Instructions (Addendum)
You have been scheduled for an endoscopy and colonoscopy with propofol. Please follow the written instructions given to you at your visit today. Please pick up your prep at the pharmacy within the next 1-3 days. If you use inhalers (even only as needed) or a CPAP machine, please bring them with you on the day of your procedure.  We have sent the following medications to your pharmacy for you to pick up at your convenience: Zantac.  Stay on pantoprazole and take Maalox and Carafate as needed.  Stop taking Pepto Bismol and Alka-Seltzer.

## 2012-06-01 ENCOUNTER — Telehealth: Payer: Self-pay | Admitting: Gastroenterology

## 2012-06-01 NOTE — Telephone Encounter (Signed)
OK this time but will charge next time he cancels late.

## 2012-06-01 NOTE — Telephone Encounter (Signed)
Mr Wiedeman actually cancelled Wed 05-27-12 at 3pm with our Answering Service.

## 2012-06-03 ENCOUNTER — Encounter: Payer: BC Managed Care – PPO | Admitting: Gastroenterology

## 2012-07-01 DIAGNOSIS — K2270 Barrett's esophagus without dysplasia: Secondary | ICD-10-CM

## 2012-07-01 DIAGNOSIS — D126 Benign neoplasm of colon, unspecified: Secondary | ICD-10-CM

## 2012-07-01 HISTORY — DX: Benign neoplasm of colon, unspecified: D12.6

## 2012-07-01 HISTORY — DX: Barrett's esophagus without dysplasia: K22.70

## 2012-07-07 ENCOUNTER — Telehealth: Payer: Self-pay | Admitting: Gastroenterology

## 2012-07-08 NOTE — Telephone Encounter (Signed)
Error

## 2012-07-08 NOTE — Telephone Encounter (Signed)
Patient states he would like his prep instructions redone and mailed to him just to make sure he does not mess up and drink at the wrong time. Told him I will reprint them and mail them to his home address.

## 2012-07-21 ENCOUNTER — Encounter: Payer: BC Managed Care – PPO | Admitting: Gastroenterology

## 2012-07-21 ENCOUNTER — Ambulatory Visit (AMBULATORY_SURGERY_CENTER): Payer: BC Managed Care – PPO | Admitting: Gastroenterology

## 2012-07-21 ENCOUNTER — Encounter: Payer: Self-pay | Admitting: Gastroenterology

## 2012-07-21 VITALS — BP 137/87 | HR 75 | Temp 97.3°F | Resp 13 | Ht 69.0 in | Wt 191.0 lb

## 2012-07-21 DIAGNOSIS — K2270 Barrett's esophagus without dysplasia: Secondary | ICD-10-CM

## 2012-07-21 DIAGNOSIS — K2210 Ulcer of esophagus without bleeding: Secondary | ICD-10-CM

## 2012-07-21 DIAGNOSIS — K208 Other esophagitis without bleeding: Secondary | ICD-10-CM

## 2012-07-21 DIAGNOSIS — D126 Benign neoplasm of colon, unspecified: Secondary | ICD-10-CM

## 2012-07-21 DIAGNOSIS — K2100 Gastro-esophageal reflux disease with esophagitis, without bleeding: Secondary | ICD-10-CM

## 2012-07-21 DIAGNOSIS — K229 Disease of esophagus, unspecified: Secondary | ICD-10-CM

## 2012-07-21 DIAGNOSIS — R195 Other fecal abnormalities: Secondary | ICD-10-CM

## 2012-07-21 MED ORDER — SODIUM CHLORIDE 0.9 % IV SOLN: 500.0000 mL | INTRAVENOUS | Status: DC

## 2012-07-21 NOTE — Progress Notes (Signed)
Called to room to assist during endoscopic procedure.  Patient ID and intended procedure confirmed with present staff. Received instructions for my participation in the procedure from the performing physician.  

## 2012-07-21 NOTE — Patient Instructions (Addendum)
Discharge instructions given with verbal understanding. Handouts on polyps,hiatal hernia and esophagitis given. Resume previous medications. YOU HAD AN ENDOSCOPIC PROCEDURE TODAY AT THE [facility]: Refer to the procedure report that was given to you for any specific questions about what was found during the examination.  If the procedure report does not answer your questions, please call your gastroenterologist to clarify.  If you requested that your care partner not be given the details of your procedure findings, then the procedure report has been included in a sealed envelope for you to review at your convenience later.  YOU SHOULD EXPECT: Some feelings of bloating in the abdomen. Passage of more gas than usual.  Walking can help get rid of the air that was put into your GI tract during the procedure and reduce the bloating. If you had a lower endoscopy (such as a colonoscopy or flexible sigmoidoscopy) you may notice spotting of blood in your stool or on the toilet paper. If you underwent a bowel prep for your procedure, then you may not have a normal bowel movement for a few days.  DIET: Your first meal following the procedure should be a light meal and then it is ok to progress to your normal diet.  A half-sandwich or bowl of soup is an example of a good first meal.  Heavy or fried foods are harder to digest and may make you feel nauseous or bloated.  Likewise meals heavy in dairy and vegetables can cause extra gas to form and this can also increase the bloating.  Drink plenty of fluids but you should avoid alcoholic beverages for 24 hours.  ACTIVITY: Your care partner should take you home directly after the procedure.  You should plan to take it easy, moving slowly for the rest of the day.  You can resume normal activity the day after the procedure however you should NOT DRIVE or use heavy machinery for 24 hours (because of the sedation medicines used during the test).    SYMPTOMS TO  REPORT IMMEDIATELY: A gastroenterologist can be reached at any hour.  During normal business hours, 8:30 AM to 5:00 PM Monday through Friday, call [phone].  After hours and on weekends, please call the GI answering service at [phone] who will take a message and have the physician on call contact you.   Following lower endoscopy (colonoscopy or flexible sigmoidoscopy):  Excessive amounts of blood in the stool  Significant tenderness or worsening of abdominal pains  Swelling of the abdomen that is new, acute  Fever of 100F or higher  Following upper endoscopy (EGD)  Vomiting of blood or coffee ground material  New chest pain or pain under the shoulder blades  Painful or persistently difficult swallowing  New shortness of breath  Fever of 100F or higher  Black, tarry-looking stools  FOLLOW UP: If any biopsies were taken you will be contacted by phone or by letter within the next 1-3 weeks.  Call your gastroenterologist if you have not heard about the biopsies in 3 weeks.  Our staff will call the home number listed on your records the next business day following your procedure to check on you and address any questions or concerns that you may have at that time regarding the information given to you following your procedure. This is a courtesy call and so if there is no answer at the home number and we have not heard from you through the emergency physician on call, we will assume that  you have returned to your regular daily activities without incident.  SIGNATURES/CONFIDENTIALITY: You and/or your care partner have signed paperwork which will be entered into your electronic medical record.  These signatures attest to the fact that that the information above on your After Visit Summary has been reviewed and is understood.  Full responsibility of the confidentiality of this discharge information lies with you and/or your care-partner.

## 2012-07-21 NOTE — Progress Notes (Signed)
The pt and his wife stated he did pass some gas.  I encouraged him to continue to bear down and push the gas out.  He said he would.  Pt denied abdominal pain.  I also recommended drinking warm fluid to aid relaxing the smooth muscles could possible help him to pass flatus.  Maw  Patient did not experience any of the following events: a burn prior to discharge; a fall within the facility; wrong site/side/patient/procedure/implant event; or a hospital transfer or hospital admission upon discharge from the facility. [phone]) Patient did not have preoperative order for IV antibiotic SSI prophylaxis. [phone])

## 2012-07-21 NOTE — Progress Notes (Signed)
Lidocaine-40mg IV prior to Propofol InductionPropofol given over incremental dosages 

## 2012-07-21 NOTE — Progress Notes (Signed)
No complaints noted in the recovery room. maw 

## 2012-07-21 NOTE — Op Note (Signed)
[facility] [address] [place], [address]   ENDOSCOPY PROCEDURE REPORT  PATIENT: Donald, [name]  MR#: [id number] BIRTHDATE: [birth year] , 61  yrs. old GENDER: Male ENDOSCOPIST: Meryl Dare, MD, [facility] PROCEDURE DATE:  07/21/2012 PROCEDURE:  EGD w/ biopsy ASA CLASS:     Class II INDICATIONS:  Follow up of reflux esophagitis.   Heme positive stool. MEDICATIONS: residual sedation effect present-prior procedure, MAC sedation, administered by CRNA, propofol (Diprivan) 150mg  IV TOPICAL ANESTHETIC: none DESCRIPTION OF PROCEDURE: After the risks benefits and alternatives of the procedure were thoroughly explained, informed consent was obtained.  The LB GIF-H180 [id number] endoscope was introduced through the mouth and advanced to the second portion of the duodenum without limitations.  The instrument was slowly withdrawn as the mucosa was fully examined.  ESOPHAGUS: An irregular Z-line was observed 35 cm from the incisors. LA Class Grade A esophagitis. Multiple biopsies were performed. The esophagus was otherwise normal. STOMACH: Prior anti-reflux surgery with a 5 cm hiatal hernia indicating a slipped fundoplication.   The stomach otherwise appeared normal. DUODENUM: The duodenal mucosa showed no abnormalities in the bulb and second portion of the duodenum.  Retroflexed views revealed a hiatal hernia.     The scope was then withdrawn from the patient and the procedure completed.  COMPLICATIONS: There were no complications.  ENDOSCOPIC IMPRESSION: 1.   Irregular Z-line; multiple biopsies 2.   Mild erosive esophagitis 3.   Prior anti-reflux surgery 4.   Hiatal hernia  RECOMMENDATIONS: 1.  Anti-reflux regimen long term 2.  Await pathology results 3.  Continue PPI bid and H2RA hs long term   eSigned:  Meryl Dare, MD, [facility] 07/21/2012 11:59 AM

## 2012-07-21 NOTE — Op Note (Signed)
[facility] [address] [place], [address]   COLONOSCOPY PROCEDURE REPORT PATIENT: Donald, [name]  MR#: [id number] BIRTHDATE: 09-15-1950 , 61  yrs. old GENDER: Male ENDOSCOPIST: Meryl Dare, MD, [facility] PROCEDURE DATE:  07/21/2012 PROCEDURE:   Colonoscopy with snare polypectomy and Colonoscopy with biopsy ASA CLASS:   Class II INDICATIONS:heme-positive stool. MEDICATIONS: MAC sedation, administered by CRNA and propofol (Diprivan) 200mg  IV DESCRIPTION OF PROCEDURE:   After the risks benefits and alternatives of the procedure were thoroughly explained, informed consent was obtained.  A digital rectal exam revealed no abnormalities of the rectum.   The LB [id number]-H180AL [id number]  endoscope was introduced through the anus and advanced to the cecum, which was identified by both the appendix and ileocecal valve. No adverse events experienced.   The quality of the prep was good, using MoviPrep  The instrument was then slowly withdrawn as the colon was fully examined.  COLON FINDINGS: A sessile polyp measuring 4 mm in size was found in the transverse colon.  A polypectomy was performed with cold forceps.  The resection was complete and the polyp tissue was completely retrieved.   A sessile polyp measuring 5 mm in size was found in the sigmoid colon.  A polypectomy was performed with a cold snare.  The resection was complete and the polyp tissue was completely retrieved.   Mild diverticulosis was noted in the ascending colon and transverse colon.   Moderate diverticulosis was noted in the descending colon and sigmoid colon.   The colon was otherwise normal.  There was no diverticulosis, inflammation, polyps or cancers unless previously stated.  Retroflexed views revealed no abnormalities. The time to cecum=2 minutes 45 seconds. Withdrawal time=9 minutes 50 seconds.  The scope was withdrawn and the procedure completed. COMPLICATIONS: There were no  complications.  ENDOSCOPIC IMPRESSION: 1.   Sessile polyp measuring 4 mm in the transverse colon; polypectomy performed with cold forceps 2.   Sessile polyp measuring 5 mm in the sigmoid colon; polypectomy performed with a cold snare 3.   Mild diverticulosis was noted in the ascending colon and transverse colon 4.   Moderate diverticulosis was noted in the descending colon and sigmoid colon  RECOMMENDATIONS: 1.  Await pathology results 2.  Repeat colonoscopy in 5 years if polyp adenomatous; otherwise 10 years 3.  High fiber diet with liberal fluid intake.  eSigned:  Meryl Dare, MD, [facility] 07/21/2012 11:52 AM

## 2012-07-21 NOTE — Progress Notes (Signed)
Pt did have some hickuping while in the recovery room. Maw

## 2012-07-21 NOTE — Progress Notes (Signed)
12:20 The pt had not passed any flatus while in the recovery room.  His abdomen was soft and he denied having to pass flatus.  I encouraged him to bear down like having a BM to push the flatus out.  He tried multiple time per the pt , but was unsuccessful.  He repeated he did not have to pass any gas.  I told after he had been on the monitor for 30 minutes and dresses to go and sit on the toilet and try to pass gas there.  He said he would try.  Maw

## 2012-07-22 ENCOUNTER — Telehealth: Payer: Self-pay

## 2012-07-22 NOTE — Telephone Encounter (Signed)
Left message on answering machine. 

## 2012-07-27 ENCOUNTER — Encounter: Payer: Self-pay | Admitting: Gastroenterology

## 2012-11-11 ENCOUNTER — Other Ambulatory Visit: Payer: Self-pay

## 2012-11-11 MED ORDER — PANTOPRAZOLE SODIUM 40 MG PO TBEC: 40.0000 mg | DELAYED_RELEASE_TABLET | Freq: Two times a day (BID) | ORAL | Status: DC

## 2012-12-08 ENCOUNTER — Other Ambulatory Visit: Payer: Self-pay

## 2012-12-08 MED ORDER — PANTOPRAZOLE SODIUM 40 MG PO TBEC: 40.0000 mg | DELAYED_RELEASE_TABLET | Freq: Two times a day (BID) | ORAL | Status: DC

## 2012-12-08 MED ORDER — RANITIDINE HCL 300 MG PO TABS: 300.0000 mg | ORAL_TABLET | Freq: Every day | ORAL | Status: DC

## 2013-05-14 ENCOUNTER — Other Ambulatory Visit: Payer: Self-pay | Admitting: Gastroenterology

## 2013-07-14 IMAGING — US US ABDOMEN COMPLETE
1 series · 13 of 25 positions shown · non-contrast
Comparison: None.

CLINICAL DATA: Abdominal pain.  Cholelithiasis.

ABDOMINAL ULTRASOUND COMPLETE

[Series 1: us abdomen complete · 0.30mm/px · 13 of 48 slices shown]
[im 1/48]
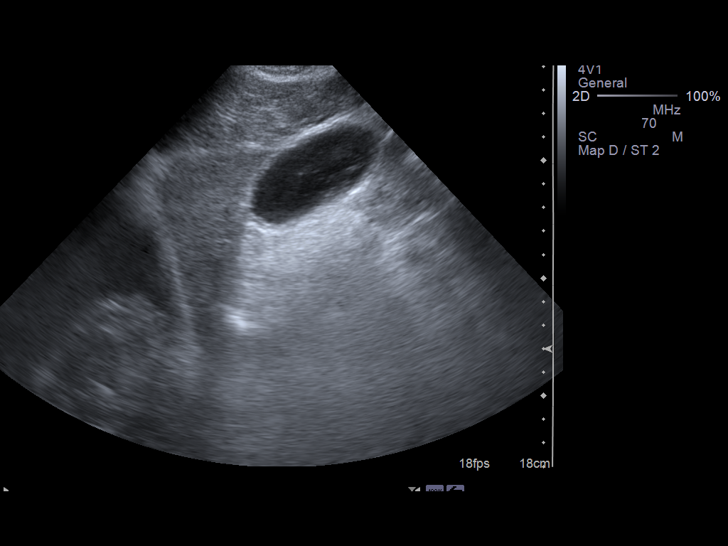
[im 4/48]
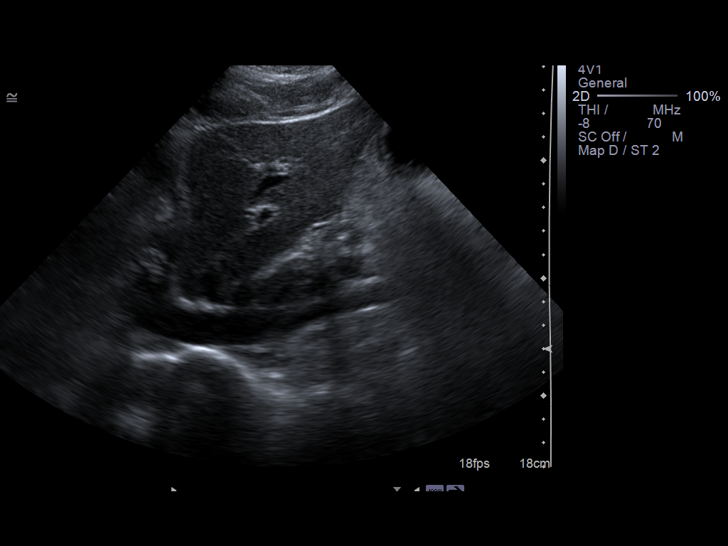
[im 8/48]
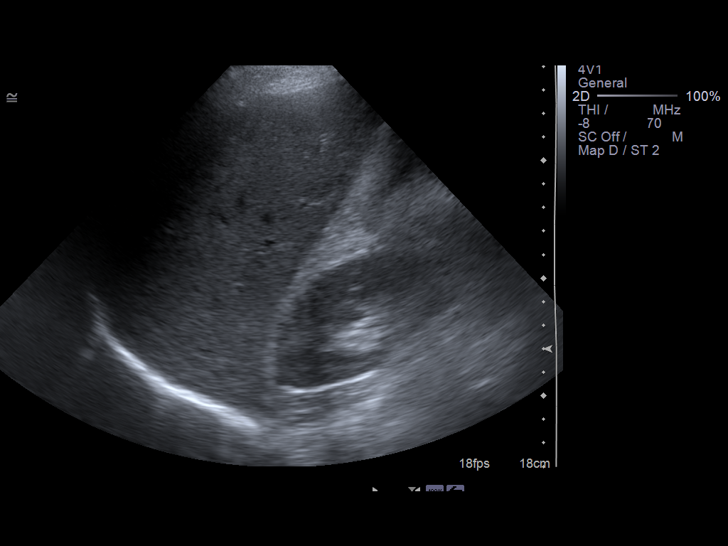
[im 12/48]
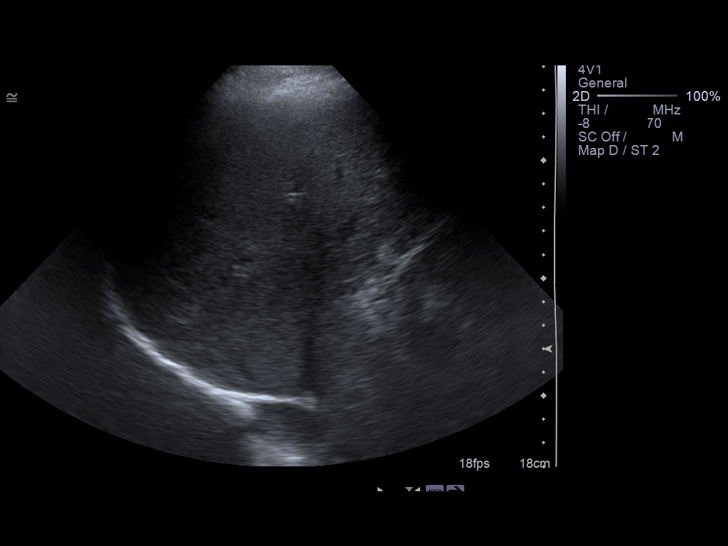
[im 16/48]
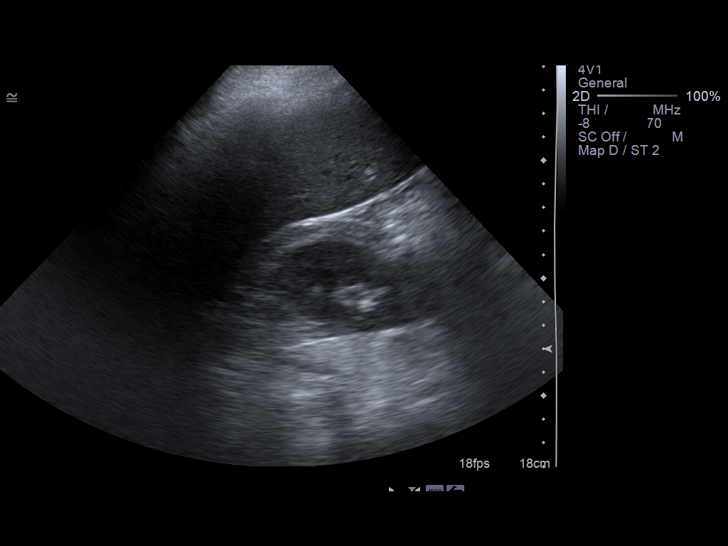
[im 20/48]
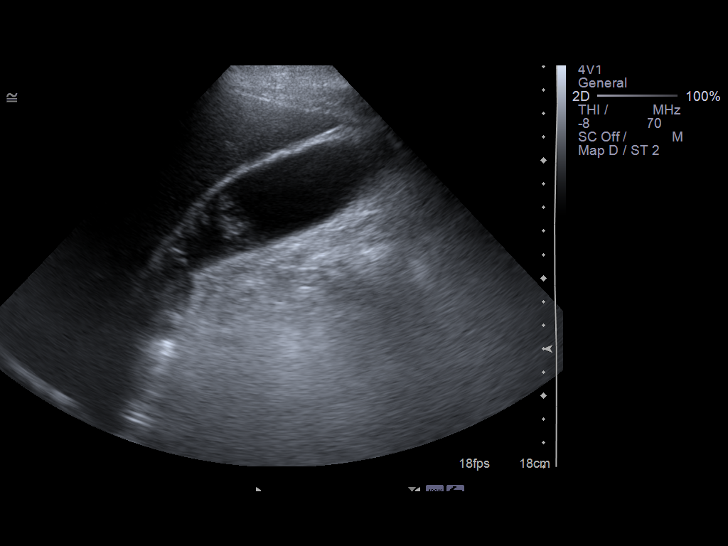
[im 24/48]
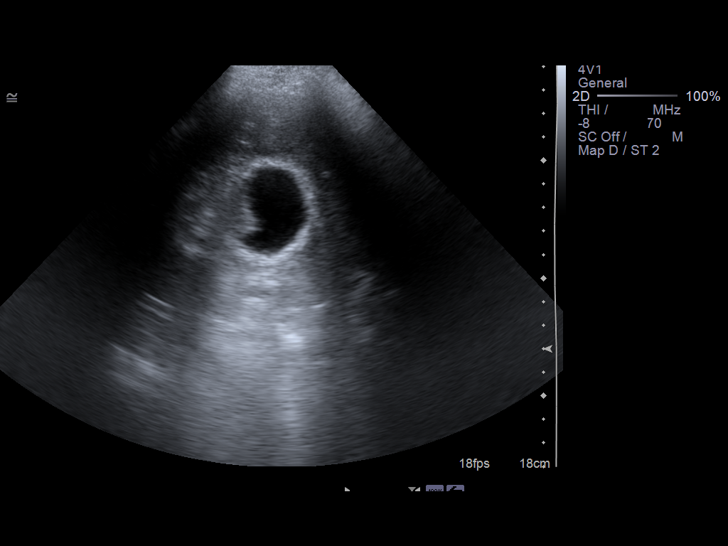
[im 28/48]
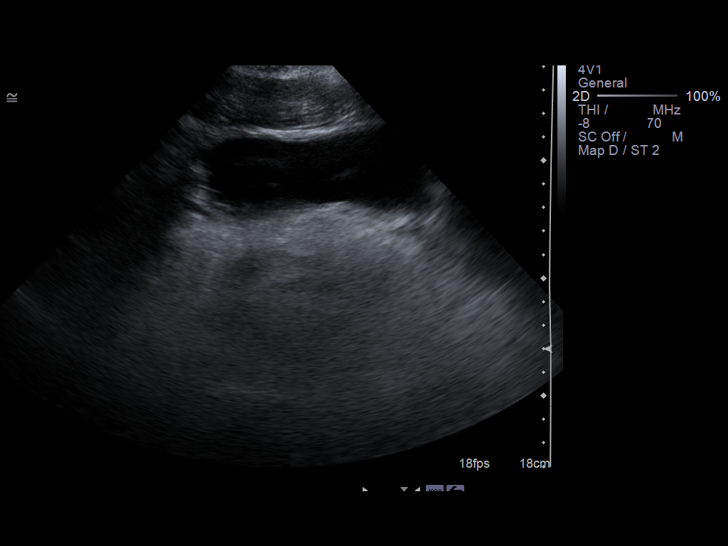
[im 32/48]
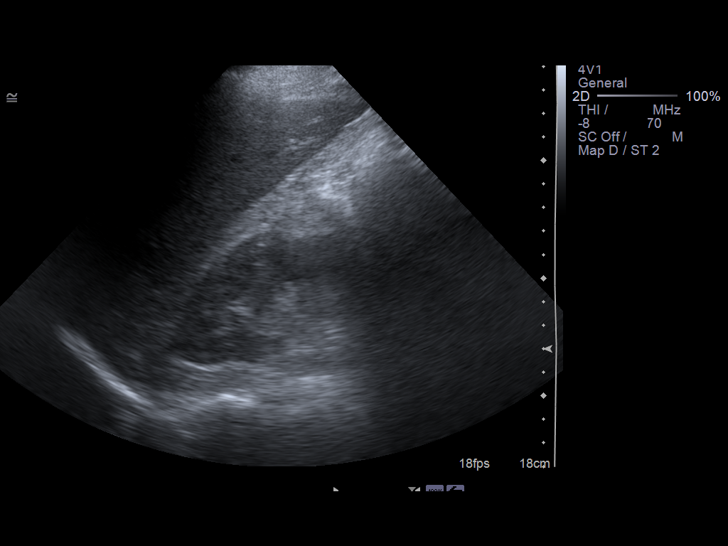
[im 36/48]
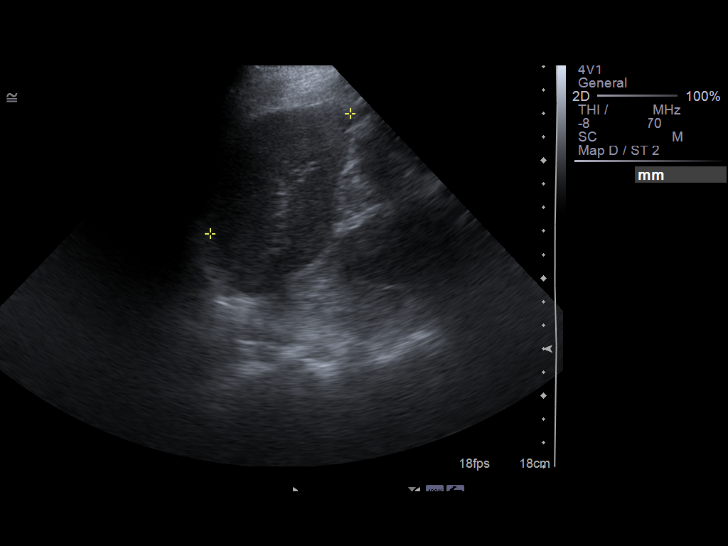
[im 40/48]
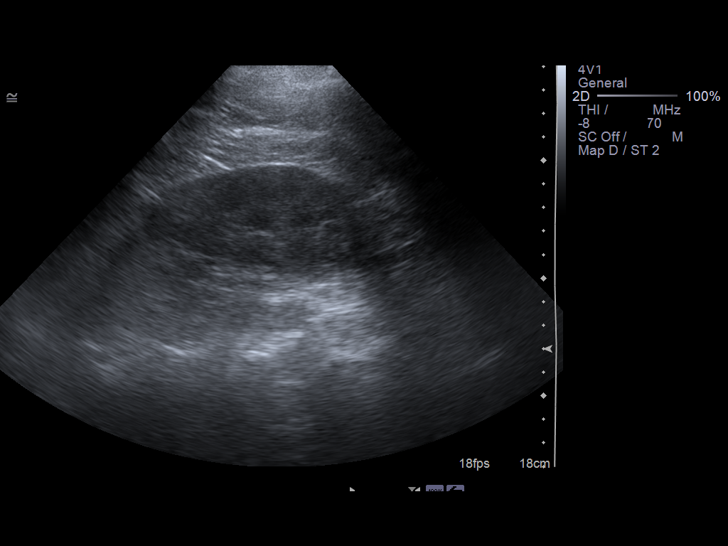
[im 44/48]
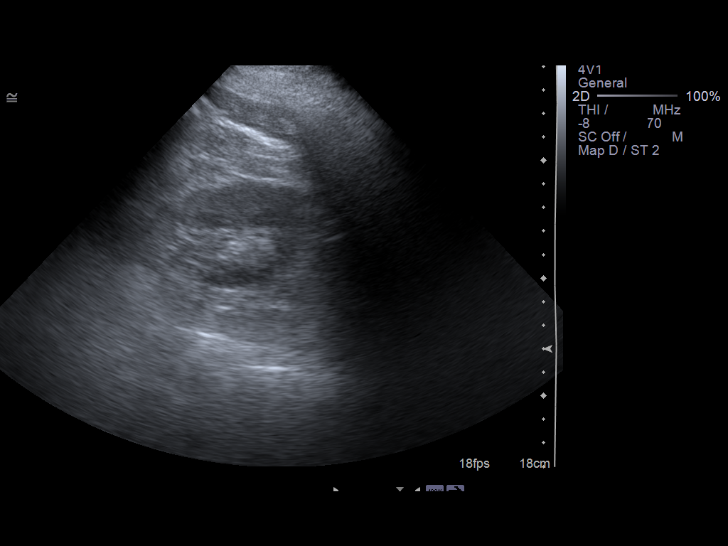
[im 48/48]
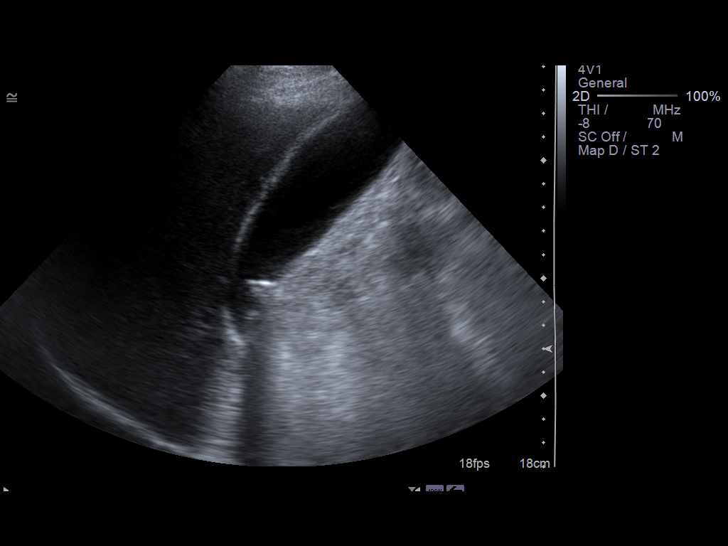

[13 of 25 positions shown; findings below may reference images not displayed]

FINDINGS: Gallbladder:  Multiple small gallstones are seen up to 1 cm in
size, one or more within the gallbladder neck.  Some echogenic
sludge is also seen within the gallbladder.  The gallbladder is
mildly distended and there is mild gallbladder wall thickening
measuring up to 5 mm.  This is suspicious for acute cholecystitis.
No pericholecystic fluid identified.

Common Bile Duct:  Not well visualized, however no dilated
intrahepatic ducts identified.

Liver: No focal mass lesion identified.  Within normal limits in
parenchymal echogenicity.

IVC:  Appears normal.

Pancreas:  No abnormality identified.

Spleen:  Within normal limits in size and echotexture.

Right kidney:  Normal in size and parenchymal echogenicity.  No
evidence of mass or hydronephrosis.

Left kidney:  Normal in size and parenchymal echogenicity.  No
evidence of mass or hydronephrosis.

Abdominal Aorta:  No aneurysm identified.
IMPRESSION: 1.  Cholelithiasis, and findings suspicious for acute
cholecystitis.
2.  No evidence of biliary ductal dilatation.  No other significant
abnormality identified.

## 2013-08-12 ENCOUNTER — Other Ambulatory Visit: Payer: Self-pay | Admitting: Gastroenterology

## 2013-08-12 NOTE — Telephone Encounter (Signed)
NEEDS OFFICE VISIT FOR ANY FURTHER REFILLS! 

## 2013-08-31 ENCOUNTER — Emergency Department (HOSPITAL_COMMUNITY)
Admission: EM | Admit: 2013-08-31 | Discharge: 2013-08-31 | Disposition: A | Discharge status: Home / Self Care | Payer: BC Managed Care – PPO | Source: Home / Self Care | Attending: Family Medicine | Admitting: Family Medicine

## 2013-08-31 ENCOUNTER — Emergency Department (INDEPENDENT_AMBULATORY_CARE_PROVIDER_SITE_OTHER): Payer: BC Managed Care – PPO

## 2013-08-31 ENCOUNTER — Encounter (HOSPITAL_COMMUNITY): Payer: Self-pay | Admitting: Emergency Medicine

## 2013-08-31 DIAGNOSIS — R69 Illness, unspecified: Principal | ICD-10-CM

## 2013-08-31 DIAGNOSIS — J111 Influenza due to unidentified influenza virus with other respiratory manifestations: Secondary | ICD-10-CM

## 2013-08-31 MED ORDER — HYDROCOD POLST-CHLORPHEN POLST 10-8 MG/5ML PO LQCR: 5.0000 mL | Freq: Two times a day (BID) | ORAL | Status: DC | PRN

## 2013-08-31 NOTE — ED Notes (Signed)
Patient complains of chest congestion cough, fatigue and weakness, fever/chills, and dizziness since saturday; denies nausea/vomiting, diarrhea.

## 2013-08-31 NOTE — Discharge Instructions (Signed)
Drink plenty of fluids as discussed, use medicine as prescribed, and mucinex or delsym for cough. Return or see your doctor if further problems °

## 2013-08-31 NOTE — ED Provider Notes (Signed)
CSN: [id number]     Arrival date & time 08/31/13  3790 History   First MD Initiated Contact with Patient 08/31/13 [phone]     Chief Complaint  Patient presents with  . Influenza   (Consider location/radiation/quality/duration/timing/severity/associated sxs/prior Treatment) Patient is a 63 y.o. male presenting with flu symptoms. The history is provided by the patient.  Influenza Presenting symptoms: cough, fatigue, fever, myalgias and shortness of breath   Presenting symptoms: no diarrhea, no nausea and no vomiting   Severity:  Mild Onset quality:  Sudden Duration:  3 days Progression:  Unchanged Chronicity:  New Associated symptoms: nasal congestion   Risk factors: no sick contacts     Past Medical History  Diagnosis Date  . GERD (gastroesophageal reflux disease)   . GI bleed   . Diverticulosis   . Colon polyps     Dr Leanora Cover  . S/P laparoscopic cholecystectomy for gangrenous GB complicated by bile leak 06/07/2011  . Erosive esophagitis    Past Surgical History  Procedure Laterality Date  . Cholecystectomy    . Nissen fundoplication    . Hernia repair    . Esophagogastroduodenoscopy  10/08/2011    Procedure: ESOPHAGOGASTRODUODENOSCOPY (EGD);  Surgeon: Ladene Artist, MD,FACG;  Location: [facility];  Service: Endoscopy;  Laterality: N/A;  . Colonoscopy     Family History  Problem Relation Age of Onset  . Colon cancer Neg Hx   . Breast cancer Maternal Aunt   . Lung cancer Mother   . Pancreatitis Father   . Esophageal cancer Paternal Uncle   . Heart disease Mother    History  Substance Use Topics  . Smoking status: Never Smoker   . Smokeless tobacco: Never Used  . Alcohol Use: No     Comment: Occasional    Review of Systems  Constitutional: Positive for fever, activity change and fatigue. Negative for appetite change.  HENT: Positive for congestion.   Respiratory: Positive for cough and shortness of breath.   Gastrointestinal: Negative.  Negative for nausea,  vomiting and diarrhea.  Genitourinary: Negative.   Musculoskeletal: Positive for myalgias.  Neurological: Positive for weakness.    Allergies  Review of patient's allergies indicates no known allergies.  Home Medications   Current Outpatient Rx  Name  Route  Sig  Dispense  Refill  . pantoprazole (PROTONIX) 40 MG tablet      TAKE 1 TABLET TWICE A DAY BEFORE MEALS   180 tablet   0   . ranitidine (ZANTAC) 300 MG tablet      TAKE 1 TABLET AT BEDTIME   90 tablet   0   . chlorpheniramine-HYDROcodone (TUSSIONEX PENNKINETIC ER) 10-8 MG/5ML LQCR   Oral   Take 5 mLs by mouth every 12 (twelve) hours as needed for cough.   115 mL   0   . sucralfate (CARAFATE) 1 G tablet   Oral   Take 1 tablet (1 g total) by mouth 4 (four) times daily.   20 tablet   0    BP 138/90  Pulse 112  Temp(Src) 98.8 F (37.1 C) (Oral)  Resp 18  SpO2 94% Physical Exam  Nursing note and vitals reviewed. Constitutional: He is oriented to person, place, and time. He appears well-developed and well-nourished.  HENT:  Head: Normocephalic.  Right Ear: External ear normal.  Left Ear: External ear normal.  Mouth/Throat: Oropharynx is clear and moist.  Eyes: Conjunctivae are normal. Pupils are equal, round, and reactive to light.  Neck: Normal range of motion.  Neck supple.  Cardiovascular: Normal rate, regular rhythm, normal heart sounds and intact distal pulses.   Pulmonary/Chest: Effort normal and breath sounds normal. He has no wheezes. He has no rales.  Abdominal: Soft. Bowel sounds are normal. There is no tenderness.  Lymphadenopathy:    He has no cervical adenopathy.  Neurological: He is alert and oriented to person, place, and time.  Skin: Skin is warm and dry.    ED Course  Procedures (including critical care time) Labs Review Labs Reviewed - No data to display Imaging Review Dg Chest 2 View  08/31/2013   CLINICAL DATA:  Cough and fever  EXAM: CHEST  2 VIEW  COMPARISON:  None.  FINDINGS:  The heart size and mediastinal contours are within normal limits. Both lungs are clear. The visualized skeletal structures are unremarkable.  IMPRESSION: No active cardiopulmonary disease.   Electronically Signed   By: Franchot Gallo M.D.   On: 08/31/2013 10:02   X-rays reviewed and report per radiologist.   MDM   1. Influenza-like illness        Billy Fischer, MD 08/31/13 1009

## 2013-10-22 ENCOUNTER — Other Ambulatory Visit: Payer: Self-pay | Admitting: Gastroenterology

## 2013-11-16 ENCOUNTER — Telehealth: Payer: Self-pay | Admitting: Gastroenterology

## 2013-11-16 NOTE — Telephone Encounter (Signed)
Left a message for patient to return my call. 

## 2013-11-17 NOTE — Telephone Encounter (Signed)
Informed patient that we can give him a 30 day supply until he comes in for his office visit. Patient states he did not realize he had so many pills left and actually can make it through until the appt so he just wants to wait until that day to get a 90 day supply. Told patient that was fine and we will see him then for the appt.

## 2013-12-02 ENCOUNTER — Ambulatory Visit (INDEPENDENT_AMBULATORY_CARE_PROVIDER_SITE_OTHER): Payer: BC Managed Care – PPO | Admitting: Gastroenterology

## 2013-12-02 ENCOUNTER — Encounter: Payer: Self-pay | Admitting: Gastroenterology

## 2013-12-02 VITALS — BP 136/82 | HR 78 | Ht 69.0 in | Wt 196.8 lb

## 2013-12-02 DIAGNOSIS — K2270 Barrett's esophagus without dysplasia: Secondary | ICD-10-CM

## 2013-12-02 DIAGNOSIS — Z86010 Personal history of colonic polyps: Secondary | ICD-10-CM

## 2013-12-02 MED ORDER — PANTOPRAZOLE SODIUM 40 MG PO TBEC: DELAYED_RELEASE_TABLET | ORAL | Status: DC

## 2013-12-02 MED ORDER — RANITIDINE HCL 300 MG PO TABS: ORAL_TABLET | ORAL | Status: DC

## 2013-12-02 NOTE — Patient Instructions (Addendum)
We have sent the following medications to your mail order pharmacy for you: Dexilant and ranitidine.   Thank you for choosing me and [facility].  Pricilla Riffle. Dagoberto Ligas., MD., Marval Regal

## 2013-12-02 NOTE — Progress Notes (Signed)
    History of Present Illness: This is a 63 year old male with short segment Barrett's esophagus diagnosed in January 2014. He has a history of hemorrhagic esophagitis. Maintained on pantoprazole twice daily and ranitidine at bedtime with excellent control of his reflux symptoms. He has multiple questions about Barrett's management including dysplasia, RFA, endoscopic followup, aspirin usage.  Current Medications, Allergies, Past Medical History, Past Surgical History, Family History and Social History were reviewed in [employer] record.  Physical Exam: General: Well developed , well nourished, no acute distress Head: Normocephalic and atraumatic Eyes:  sclerae anicteric, EOMI Ears: Normal auditory acuity Mouth: No deformity or lesions Lungs: Clear throughout to auscultation Heart: Regular rate and rhythm; no murmurs, rubs or bruits Abdomen: Soft, non tender and non distended. No masses, hepatosplenomegaly or hernias noted. Normal Bowel sounds Musculoskeletal: Symmetrical with no gross deformities  Pulses:  Normal pulses noted Extremities: No clubbing, cyanosis, edema or deformities noted Neurological: Alert oriented x 4, grossly nonfocal Psychological:  Alert and cooperative. Normal mood and affect  Assessment and Recommendations:  1. GERD with a history of Barrett's esophagus and hemorrhagic esophagitis. Continue pantoprazole 40 mg twice a day and ranitidine 300 mg at bedtime long term. Continue all standard antireflux measures. I attempted to address all his questions. Surveillance endoscopy at the 3 year interval. RFA is generally indicated only with Barrett's and dysplasia. Advised against ongoing aspirin and NSAID usage given his history of GI bleed. May use aspirin when necessary for headaches.  2. Personal history of adenomatous colon polyps. Surveillance colonoscopy recommended a 5 year interval in January 2019.

## 2014-04-12 ENCOUNTER — Telehealth: Payer: Self-pay | Admitting: Gastroenterology

## 2014-04-12 MED ORDER — SUCRALFATE 1 GM/10ML PO SUSP: 1.0000 g | Freq: Four times a day (QID) | ORAL | Status: DC

## 2014-04-12 NOTE — Telephone Encounter (Signed)
OK to refill

## 2014-04-12 NOTE — Telephone Encounter (Signed)
Patient requesting a rx for carafate. He says that he is well controlled on bid pantoprazole and zantac at HS.  "I am well controlled, but occasionally I have a problem and carafate has helped in the past".  Dr. Fuller Plan ok to call in rx of carafate 1 GM?

## 2014-04-12 NOTE — Telephone Encounter (Signed)
Patient notified

## 2014-04-15 ENCOUNTER — Other Ambulatory Visit (INDEPENDENT_AMBULATORY_CARE_PROVIDER_SITE_OTHER): Payer: BC Managed Care – PPO

## 2014-04-15 ENCOUNTER — Ambulatory Visit (INDEPENDENT_AMBULATORY_CARE_PROVIDER_SITE_OTHER): Payer: BC Managed Care – PPO | Admitting: Internal Medicine

## 2014-04-15 ENCOUNTER — Encounter: Payer: Self-pay | Admitting: Internal Medicine

## 2014-04-15 VITALS — BP 110/76 | HR 93 | Temp 98.0°F | Resp 14 | Ht 69.0 in | Wt 191.4 lb

## 2014-04-15 DIAGNOSIS — K2270 Barrett's esophagus without dysplasia: Secondary | ICD-10-CM

## 2014-04-15 DIAGNOSIS — Z0000 Encounter for general adult medical examination without abnormal findings: Secondary | ICD-10-CM

## 2014-04-15 DIAGNOSIS — Z23 Encounter for immunization: Secondary | ICD-10-CM

## 2014-04-15 DIAGNOSIS — Z136 Encounter for screening for cardiovascular disorders: Secondary | ICD-10-CM | POA: Insufficient documentation

## 2014-04-15 LAB — BASIC METABOLIC PANEL
BUN: 12 mg/dL (ref 6–23)
CALCIUM: 9.1 mg/dL (ref 8.4–10.5)
CO2: 22 mEq/L (ref 19–32)
Chloride: 105 mEq/L (ref 96–112)
Creatinine, Ser: 1.3 mg/dL (ref 0.4–1.5)
GFR: 62.4 mL/min (ref >60.00)
GLUCOSE: 94 mg/dL (ref 70–99)
Potassium: 4.4 mEq/L (ref 3.5–5.1)
SODIUM: 138 meq/L (ref 135–145)

## 2014-04-15 LAB — LIPID PANEL
Cholesterol: 172 mg/dL (ref 0–200)
HDL: 33.6 mg/dL — ABNORMAL LOW (ref >39.00)
LDL Cholesterol: 116 mg/dL — ABNORMAL HIGH (ref 0–99)
NonHDL: 138.4
Total CHOL/HDL Ratio: 5
Triglycerides: 112 mg/dL (ref 0.0–149.0)
VLDL: 22.4 mg/dL (ref 0.0–40.0)

## 2014-04-15 NOTE — Assessment & Plan Note (Addendum)
He gets colonoscopy again in 2019, given flu shot today, he will return for shingles shot in 1 month. Talked to him about tetanus shot today and he will consider. Check basic metabolic panel and lipid panel today.

## 2014-04-15 NOTE — Patient Instructions (Signed)
We will check your blood work today and call you with the results. We have given you the flu shot today.   You can come back at least 1 month from now to get the shingles shot. This will help prevent you from getting shingles again or at least make it milder.   Work on getting back into exercising for your health.   We'll see you back in about 1 year for a physical.   Exercise to Stay Healthy Exercise helps you become and stay healthy. EXERCISE IDEAS AND TIPS Choose exercises that:  You enjoy.  Fit into your day. You do not need to exercise really hard to be healthy. You can do exercises at a slow or medium level and stay healthy. You can:  Stretch before and after working out.  Try yoga, Pilates, or tai chi.  Lift weights.  Walk fast, swim, jog, run, climb stairs, bicycle, dance, or rollerskate.  Take aerobic classes. Exercises that burn about 150 calories:  Running 1  miles in 15 minutes.  Playing volleyball for 45 to 60 minutes.  Washing and waxing a car for 45 to 60 minutes.  Playing touch football for 45 minutes.  Walking 1  miles in 35 minutes.  Pushing a stroller 1  miles in 30 minutes.  Playing basketball for 30 minutes.  Raking leaves for 30 minutes.  Bicycling 5 miles in 30 minutes.  Walking 2 miles in 30 minutes.  Dancing for 30 minutes.  Shoveling snow for 15 minutes.  Swimming laps for 20 minutes.  Walking up stairs for 15 minutes.  Bicycling 4 miles in 15 minutes.  Gardening for 30 to 45 minutes.  Jumping rope for 15 minutes.  Washing windows or floors for 45 to 60 minutes. Document Released: 07/20/2010 Document Revised: 09/09/2011 Document Reviewed: 07/20/2010 [facility] Patient Information 2015 [place], [place]. This information is not intended to replace advice given to you by your health care provider. Make sure you discuss any questions you have with your health care provider.

## 2014-04-15 NOTE — Progress Notes (Signed)
Pre visit review using our clinic review tool, if applicable. No additional management support is needed unless otherwise documented below in the visit note. 

## 2014-04-15 NOTE — Assessment & Plan Note (Signed)
He gets EGD every 3 years, is currently taking Protonix twice a day as well as Carafate at nighttime. He is advised to stay on lifelong PPI therapy. No symptoms of GERD at this time.

## 2014-04-15 NOTE — Progress Notes (Signed)
   Subjective:    Patient ID: [name], male    DOB: 1950-09-29, 63 y.o.   MRN: [id number]  HPI The patient is a 63 YO man who is coming in to establish care. He has PMH of barett's esophagus. He denies any complaints today and wants to get his flu shot. He is having some sinus congestion however he feels that it is improving gradually. He denies fevers, chills, sore throat, cough, shortness of breath, ear drainage/fullness. He denies any problems with acid reflux although he has no religious about taking his Protonix. He is recently switched from Zantac to Carafate at nighttime. He is not a smoker and rarely uses alcohol. He denies chest pains, abdominal pains, constipation, diarrhea, blood in stool or dark stool.  Review of Systems  Constitutional: Negative for fever, activity change, appetite change, fatigue and unexpected weight change.  HENT: Positive for congestion. Negative for postnasal drip, rhinorrhea and sinus pressure.   Respiratory: Negative for cough, chest tightness, shortness of breath and wheezing.   Cardiovascular: Negative for chest pain, palpitations and leg swelling.  Gastrointestinal: Negative for abdominal pain, diarrhea, constipation, blood in stool and abdominal distention.  Genitourinary: Negative.   Musculoskeletal: Negative for arthralgias, gait problem and myalgias.  Skin: Negative.   Neurological: Negative for dizziness, weakness and light-headedness.      Objective:   Physical Exam  Vitals reviewed. Constitutional: He is oriented to person, place, and time. He appears well-developed and well-nourished. No distress.  HENT:  Head: Normocephalic and atraumatic.  Eyes: EOM are normal.  Neck: Normal range of motion.  Cardiovascular: Normal rate and regular rhythm.   No murmur heard. Pulmonary/Chest: Effort normal and breath sounds normal. No respiratory distress. He has no wheezes. He has no rales.  Abdominal: Soft. Bowel sounds are normal. He exhibits  no distension. There is no tenderness. There is no rebound.  Neurological: He is alert and oriented to person, place, and time. Coordination normal.  Skin: Skin is warm and dry.   Filed Vitals:   04/15/14 0908  BP: 110/76  Pulse: 93  Temp: 98 F (36.7 C)  TempSrc: Oral  Resp: 14  Height: 5\' 9"  (1.753 m)  Weight: 191 lb 6.4 oz (86.818 kg)  SpO2: 97%      Assessment & Plan:  Flu shot given today.

## 2014-04-19 MED ORDER — SUCRALFATE 1 G PO TABS: 1.0000 g | ORAL_TABLET | Freq: Four times a day (QID) | ORAL | Status: DC

## 2014-04-19 NOTE — Addendum Note (Signed)
Addended by: Larina Bras on: 04/19/2014 08:38 AM   Modules accepted: Orders, Medications

## 2014-04-19 NOTE — Telephone Encounter (Signed)
Patient now requests carafate tablets in place of carafate suspension. May I switch him over to tablets as requested?

## 2014-04-19 NOTE — Telephone Encounter (Signed)
rx for tablets sent in place of suspension.

## 2014-04-19 NOTE — Telephone Encounter (Signed)
Yes, ok to switch  

## 2014-09-27 ENCOUNTER — Encounter (HOSPITAL_COMMUNITY): Payer: Self-pay | Admitting: Emergency Medicine

## 2014-09-27 ENCOUNTER — Emergency Department (HOSPITAL_COMMUNITY): Payer: BLUE CROSS/BLUE SHIELD

## 2014-09-27 ENCOUNTER — Emergency Department (HOSPITAL_COMMUNITY)
Admission: EM | Admit: 2014-09-27 | Discharge: 2014-09-28 | Disposition: A | Discharge status: Home / Self Care | Payer: BLUE CROSS/BLUE SHIELD | Attending: Emergency Medicine | Admitting: Emergency Medicine

## 2014-09-27 DIAGNOSIS — Z7982 Long term (current) use of aspirin: Secondary | ICD-10-CM | POA: Diagnosis not present

## 2014-09-27 DIAGNOSIS — R0789 Other chest pain: Secondary | ICD-10-CM | POA: Diagnosis not present

## 2014-09-27 DIAGNOSIS — K219 Gastro-esophageal reflux disease without esophagitis: Secondary | ICD-10-CM | POA: Insufficient documentation

## 2014-09-27 DIAGNOSIS — K2270 Barrett's esophagus without dysplasia: Secondary | ICD-10-CM | POA: Diagnosis not present

## 2014-09-27 DIAGNOSIS — K208 Other esophagitis: Secondary | ICD-10-CM | POA: Diagnosis not present

## 2014-09-27 DIAGNOSIS — Z79899 Other long term (current) drug therapy: Secondary | ICD-10-CM | POA: Insufficient documentation

## 2014-09-27 DIAGNOSIS — Z85038 Personal history of other malignant neoplasm of large intestine: Secondary | ICD-10-CM | POA: Insufficient documentation

## 2014-09-27 DIAGNOSIS — R079 Chest pain, unspecified: Secondary | ICD-10-CM | POA: Diagnosis present

## 2014-09-27 LAB — BASIC METABOLIC PANEL
Anion gap: 10 (ref 5–15)
BUN: 11 mg/dL (ref 6–23)
CHLORIDE: 106 mmol/L (ref 96–112)
CO2: 22 mmol/L (ref 19–32)
CREATININE: 1.39 mg/dL — AB (ref 0.50–1.35)
Calcium: 9.2 mg/dL (ref 8.4–10.5)
GFR calc Af Amer: 62 mL/min — ABNORMAL LOW (ref >90)
GFR calc non Af Amer: 53 mL/min — ABNORMAL LOW (ref >90)
Glucose, Bld: 102 mg/dL — ABNORMAL HIGH (ref 70–99)
Potassium: 3.8 mmol/L (ref 3.5–5.1)
Sodium: 138 mmol/L (ref 135–145)

## 2014-09-27 LAB — CBC
HEMATOCRIT: 47.7 % (ref 39.0–52.0)
Hemoglobin: 16.9 g/dL (ref 13.0–17.0)
MCH: 31.8 pg (ref 26.0–34.0)
MCHC: 35.4 g/dL (ref 30.0–36.0)
MCV: 89.8 fL (ref 78.0–100.0)
Platelets: 264 10*3/uL (ref 150–400)
RBC: 5.31 MIL/uL (ref 4.22–5.81)
RDW: 12.4 % (ref 11.5–15.5)
WBC: 7.6 10*3/uL (ref 4.0–10.5)

## 2014-09-27 LAB — LIPASE, BLOOD: LIPASE: 50 U/L (ref 11–59)

## 2014-09-27 LAB — I-STAT TROPONIN, ED: Troponin i, poc: 0 ng/mL (ref 0.00–0.08)

## 2014-09-27 NOTE — ED Notes (Addendum)
Pt reports experiencing shortness of breath with chest pressure throughout the day- admits that pain radiated into left arm earlier today- respirations e/u at present.  Pt also admits to feeling dizzy, neuro exam normal and pt alert and oriented X 4.  Pt reports that pain he also noticed pain in LUQ, denies N/V.  No acute distress noted.

## 2014-09-27 NOTE — ED Provider Notes (Signed)
CSN: [id number]     Arrival date & time 09/27/14  2109 History  This chart was scribed for Donald Pert, MD by Delphia Grates, ED Scribe. This patient was seen in room D34C/D34C and the patient's care was started at 12:02 AM.    Chief Complaint  Patient presents with  . Chest Pain    Patient is a 64 y.o. male presenting with chest pain. The history is provided by the patient. No language interpreter was used.  Chest Pain Chest pain location: generalized. Pain quality: pressure   Pain quality comment:  "heaviness" Pain radiates to:  Does not radiate Pain radiates to the back: no   Pain severity:  Moderate Onset quality:  Sudden Duration:  11 hours Timing:  Constant Progression:  Resolved Chronicity:  New Relieved by:  None tried Worsened by:  Nothing tried Ineffective treatments:  None tried Associated symptoms: dizziness (resolved) and shortness of breath (resolved)   Associated symptoms: no abdominal pain, no back pain, no cough, no fever, no headache, no nausea and not vomiting   Risk factors: no coronary artery disease      HPI Comments: [name] is a 64 y.o. male, with history of GI bleed, diverticulitis, Barrett's, esophagus, GERD, who presents to the Emergency Department complaining of constant, generalized chest heaviness that began yesterday (09/27/2014) while at work. 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 He notes associated periodic SOB for the past few weeks that has gotten worse today, in addition to dizziness that began today. He states this episode lasted for approximately 10-11 hours (non fluctuating) and resolved while in the waiting room. Patient denies any other symptoms at this time. Patient also denies any significant cardiac history. He notes maternal history of angina, but reports his mother died of lung cancer. Patient is a nonsmoker.  PCP: Olga Millers, MD  Past Medical History  Diagnosis Date  . GERD  (gastroesophageal reflux disease)   . GI bleed   . Diverticulosis   . Tubular adenoma of colon 07/2012  . S/P laparoscopic cholecystectomy for gangrenous GB complicated by bile leak 06/07/2011  . Erosive esophagitis   . Barrett's esophagus 07/2012   Past Surgical History  Procedure Laterality Date  . Cholecystectomy    . Nissen fundoplication    . Hernia repair    . Esophagogastroduodenoscopy  10/08/2011    Procedure: ESOPHAGOGASTRODUODENOSCOPY (EGD);  Surgeon: Ladene Artist, MD,FACG;  Location: [facility];  Service: Endoscopy;  Laterality: N/A;  . Colonoscopy     Family History  Problem Relation Age of Onset  . Colon cancer Neg Hx   . Breast cancer Maternal Aunt   . Lung cancer Mother   . Pancreatitis Father   . Esophageal cancer Paternal Uncle   . Heart disease Mother    History  Substance Use Topics  . Smoking status: Never Smoker   . Smokeless tobacco: Never Used  . Alcohol Use: No     Comment: Occasional    Review of Systems  Constitutional: Negative for fever and chills.  HENT: Negative for rhinorrhea and sore throat.   Eyes: Negative for visual disturbance.  Respiratory: Positive for shortness of breath (resolved). Negative for cough.   Cardiovascular: Positive for chest pain (resolved). Negative for leg swelling.  Gastrointestinal: Negative for nausea, vomiting, abdominal pain and diarrhea.  Genitourinary: Negative for dysuria.  Musculoskeletal: Negative for back pain and neck pain.  Skin: Negative for rash.  Neurological: Positive for dizziness (resolved). Negative for  headaches.  Hematological: Does not bruise/bleed easily.  Psychiatric/Behavioral: Negative for confusion.      Allergies  Review of patient's allergies indicates no known allergies.  Home Medications   Prior to Admission medications   Medication Sig Start Date End Date Taking? Authorizing Provider  aspirin EC 325 MG tablet Take 325 mg by mouth daily.   Yes Historical Provider, MD   lactobacillus acidophilus & bulgar (LACTINEX) chewable tablet Chew 1 tablet by mouth daily. 09/14/14  Yes Historical Provider, MD  pantoprazole (PROTONIX) 40 MG tablet TAKE 1 TABLET TWICE A DAY BEFORE MEALS 12/02/13  Yes Ladene Artist, MD  ranitidine (ZANTAC) 300 MG tablet TAKE 1 TABLET AT BEDTIME 12/02/13  Yes Ladene Artist, MD  sucralfate (CARAFATE) 1 G tablet Take 1 tablet (1 g total) by mouth 4 (four) times daily. Patient taking differently: Take 1 g by mouth 3 (three) times a week.  04/19/14  Yes Ladene Artist, MD   Triage Vitals: BP 147/92 mmHg  Pulse 78  Temp(Src) 97.6 F (36.4 C) (Oral)  Resp 18  SpO2 98%  Physical Exam  Constitutional: He is oriented to person, place, and time. He appears well-developed and well-nourished. No distress.  HENT:  Head: Normocephalic and atraumatic.  Right Ear: Hearing normal.  Left Ear: Hearing normal.  Nose: Nose normal.  Mouth/Throat: Oropharynx is clear and moist and mucous membranes are normal.  Eyes: Conjunctivae and EOM are normal. Pupils are equal, round, and reactive to light.  Neck: Normal range of motion. Neck supple.  Cardiovascular: Normal rate, regular rhythm, S1 normal, S2 normal and normal heart sounds.  Exam reveals no gallop and no friction rub.   No murmur heard. Pulmonary/Chest: Effort normal and breath sounds normal. No respiratory distress. He exhibits no tenderness.  Abdominal: Soft. Normal appearance and bowel sounds are normal. There is no hepatosplenomegaly. There is no tenderness. There is no rebound, no guarding, no tenderness at McBurney's point and negative Murphy's sign. No hernia.  Musculoskeletal: Normal range of motion.  Neurological: He is alert and oriented to person, place, and time. He has normal strength. No cranial nerve deficit or sensory deficit. Coordination normal. GCS eye subscore is 4. GCS verbal subscore is 5. GCS motor subscore is 6.  alert, oriented x3 speech: normal in context and  clarity memory: intact grossly cranial nerves II-XII: intact motor strength: full proximally and distally no involuntary movements or tremors sensation: intact to light touch diffusely  cerebellar: finger-to-nose and heel-to-shin intact gait: normal forwards and backwards  Skin: Skin is warm, dry and intact. No rash noted. No cyanosis.  Psychiatric: He has a normal mood and affect. His speech is normal and behavior is normal. Thought content normal.  Nursing note and vitals reviewed.   ED Course  Procedures (including critical care time)  DIAGNOSTIC STUDIES: Oxygen Saturation is 98% on room air, normal by my interpretation.    COORDINATION OF CARE: At 0017 Discussed treatment plan with patient which includes IV fluids, labs, and cardiology referral. Patient agrees.   Labs Review Labs Reviewed  BASIC METABOLIC PANEL - Abnormal; Notable for the following:    Glucose, Bld 102 (*)    Creatinine, Ser 1.39 (*)    GFR calc non Af Amer 53 (*)    GFR calc Af Amer 62 (*)    All other components within normal limits  CBC  LIPASE, BLOOD  I-STAT TROPOININ, ED    Imaging Review Dg Chest 2 View  09/27/2014   CLINICAL DATA:  Left side chest pain  EXAM: CHEST  2 VIEW  COMPARISON:  08/31/2013  FINDINGS: Cardiomediastinal silhouette is stable. No acute infiltrate or pleural effusion. No pulmonary edema. Bony thorax is unremarkable.  IMPRESSION: No active cardiopulmonary disease.   Electronically Signed   By: Lahoma Crocker M.D.   On: 09/27/2014 22:17     EKG Interpretation   Date/Time:  Tuesday September 27 2014 21:14:23 EDT Ventricular Rate:  77 PR Interval:  134 QRS Duration: 94 QT Interval:  380 QTC Calculation: 430 R Axis:   -38 Text Interpretation:  Sinus rhythm with occasional Premature ventricular  complexes Left axis deviation Incomplete right bundle branch block changes  noted Confirmed by Tanairy Payeur  MD, Rushton Early ([id number]) on 09/27/2014 11:34:15 PM      MDM   Final diagnoses:   Other chest pain    6:03 AM 64 y.o. male who presents with constant chest pressure which lasted about 10-11 hours beginning around noon today. He notes that his symptoms resolved in the waiting room lobby. He did did have some associated shortness of breath and dizziness. He is afebrile and vital signs are unremarkable here. Given constant nonfluctuating pain and negative delta troponin in a patient with few risk factors for coronary artery disease I felt like it was reasonable for discharge home and further workup as an outpatient. I offered admission but he preferred outpatient workup and I think this is reasonable. Will recommend he return for any worsening, including return of chest pain, shortness of breath, fever.    I personally performed the services described in this documentation, which was scribed in my presence. The recorded information has been reviewed and is accurate.    Donald Pert, MD 09/28/14 [phone]

## 2014-09-28 LAB — I-STAT TROPONIN, ED: Troponin i, poc: 0 ng/mL (ref 0.00–0.08)

## 2014-09-28 MED ORDER — SODIUM CHLORIDE 0.9 % IV BOLUS (SEPSIS)
1000.0000 mL | INTRAVENOUS | Status: AC
  Administered 2014-09-28: 1000 mL via INTRAVENOUS

## 2014-09-28 NOTE — ED Notes (Signed)
Pt resting quietly at this time.  Denies any pain or discomfort.  Wife at bedside.

## 2014-09-28 NOTE — Discharge Instructions (Signed)

## 2014-11-21 ENCOUNTER — Other Ambulatory Visit: Payer: Self-pay | Admitting: Gastroenterology

## 2014-12-15 ENCOUNTER — Other Ambulatory Visit: Payer: Self-pay | Admitting: Gastroenterology

## 2015-02-18 ENCOUNTER — Other Ambulatory Visit: Payer: Self-pay | Admitting: Gastroenterology

## 2015-02-27 ENCOUNTER — Telehealth: Payer: Self-pay | Admitting: Gastroenterology

## 2015-02-27 MED ORDER — RANITIDINE HCL 300 MG PO TABS: 300.0000 mg | ORAL_TABLET | Freq: Every day | ORAL | Status: DC

## 2015-02-27 NOTE — Telephone Encounter (Signed)
Patient called back and states that work # [phone] is best to reach him at.

## 2015-02-27 NOTE — Telephone Encounter (Signed)
Prescription sent to patient's pharmacy. Pt notified to keep appt for any further refills.

## 2015-03-29 ENCOUNTER — Telehealth: Payer: Self-pay | Admitting: Gastroenterology

## 2015-03-30 MED ORDER — RANITIDINE HCL 300 MG PO TABS: 300.0000 mg | ORAL_TABLET | Freq: Every day | ORAL | Status: DC

## 2015-03-30 NOTE — Telephone Encounter (Signed)
Prescription sent to patient's mail order pharmacy for 90 day supply. Pt notified to keep appt for any further refills. Pt verbalized understanding.

## 2015-04-12 ENCOUNTER — Ambulatory Visit (INDEPENDENT_AMBULATORY_CARE_PROVIDER_SITE_OTHER): Payer: BLUE CROSS/BLUE SHIELD | Admitting: Gastroenterology

## 2015-04-12 ENCOUNTER — Encounter: Payer: Self-pay | Admitting: Gastroenterology

## 2015-04-12 VITALS — BP 118/70 | HR 60 | Ht 69.0 in | Wt 189.5 lb

## 2015-04-12 DIAGNOSIS — K2270 Barrett's esophagus without dysplasia: Secondary | ICD-10-CM

## 2015-04-12 DIAGNOSIS — Z86010 Personal history of colonic polyps: Secondary | ICD-10-CM

## 2015-04-12 MED ORDER — PANTOPRAZOLE SODIUM 40 MG PO TBEC: DELAYED_RELEASE_TABLET | ORAL | Status: DC

## 2015-04-12 MED ORDER — SUCRALFATE 1 G PO TABS: 1.0000 g | ORAL_TABLET | Freq: Three times a day (TID) | ORAL | Status: DC

## 2015-04-12 MED ORDER — RANITIDINE HCL 300 MG PO TABS: 300.0000 mg | ORAL_TABLET | Freq: Every day | ORAL | Status: DC

## 2015-04-12 NOTE — Assessment & Plan Note (Signed)
GERD well controlled. Continue Protonix 40 mg po bid, Zantac 300 mg hs and Carafate 1g qid prn. EGD in 07/2015

## 2015-04-12 NOTE — Patient Instructions (Signed)
We have sent the following medications to your pharmacy for you to pick up at your convenience:Protonix, Carafate and Zantac.  Normal BMI (Body Mass Index- based on height and weight) is between 19 and 25. Your BMI today is Body mass index is 27.97 kg/(m^2). Marland Kitchen Please consider follow up  regarding your BMI with your Primary Care Provider.   Thank you for choosing me and [facility].  Pricilla Riffle. Dagoberto Ligas., MD., Marval Regal

## 2015-04-12 NOTE — Progress Notes (Signed)
    History of Present Illness: This is a 64 year old male with a history of Barrett's esophagus without dysplasia. His reflux symptoms are under very good control on his current regimen. He notes occasional breakthrough symptoms that respond well to Carafate as needed. He notes that his stools are occasionally loose since undergoing cholecystectomy. He has no other complaints.  Current Medications, Allergies, Past Medical History, Past Surgical History, Family History and Social History were reviewed in [employer] record.  Physical Exam: General: Well developed, well nourished, no acute distress Head: Normocephalic and atraumatic Eyes:  sclerae anicteric, EOMI Ears: Normal auditory acuity Mouth: No deformity or lesions Lungs: Clear throughout to auscultation Heart: Regular rate and rhythm; no murmurs, rubs or bruits Abdomen: Soft, non tender and non distended. No masses, hepatosplenomegaly or hernias noted. Normal Bowel sounds Musculoskeletal: Symmetrical with no gross deformities  Pulses:  Normal pulses noted Extremities: No clubbing, cyanosis, edema or deformities noted Neurological: Alert oriented x 4, grossly nonfocal Psychological:  Alert and cooperative. Normal mood and affect  Assessment and Recommendations:  1. Status post cholecystectomy in 2012 with occasional loose stools.

## 2015-04-12 NOTE — Assessment & Plan Note (Signed)
Five-year interval surveillance colonoscopy recommended January 2019.

## 2015-04-18 ENCOUNTER — Encounter: Payer: Self-pay | Admitting: Internal Medicine

## 2015-04-18 ENCOUNTER — Ambulatory Visit (INDEPENDENT_AMBULATORY_CARE_PROVIDER_SITE_OTHER): Payer: BLUE CROSS/BLUE SHIELD | Admitting: Internal Medicine

## 2015-04-18 VITALS — BP 112/70 | HR 81 | Temp 97.8°F | Resp 14 | Ht 69.0 in | Wt 189.4 lb

## 2015-04-18 DIAGNOSIS — Z23 Encounter for immunization: Secondary | ICD-10-CM | POA: Diagnosis not present

## 2015-04-18 DIAGNOSIS — Z0000 Encounter for general adult medical examination without abnormal findings: Secondary | ICD-10-CM

## 2015-04-18 NOTE — Patient Instructions (Signed)
Dr Charlestine Night is a rheumatologist who does a good job.   We will check the blood work today and call you back about the results.   Health Maintenance, Male A healthy lifestyle and preventative care can promote health and wellness.  Maintain regular health, dental, and eye exams.  Eat a healthy diet. Foods like vegetables, fruits, whole grains, low-fat dairy products, and lean protein foods contain the nutrients you need and are low in calories. Decrease your intake of foods high in solid fats, added sugars, and salt. Get information about a proper diet from your health care provider, if necessary.  Regular physical exercise is one of the most important things you can do for your health. Most adults should get at least 150 minutes of moderate-intensity exercise (any activity that increases your heart rate and causes you to sweat) each week. In addition, most adults need muscle-strengthening exercises on 2 or more days a week.   Maintain a healthy weight. The body mass index (BMI) is a screening tool to identify possible weight problems. It provides an estimate of body fat based on height and weight. Your health care provider can find your BMI and can help you achieve or maintain a healthy weight. For males 20 years and older:  A BMI below 18.5 is considered underweight.  A BMI of 18.5 to 24.9 is normal.  A BMI of 25 to 29.9 is considered overweight.  A BMI of 30 and above is considered obese.  Maintain normal blood lipids and cholesterol by exercising and minimizing your intake of saturated fat. Eat a balanced diet with plenty of fruits and vegetables. Blood tests for lipids and cholesterol should begin at age 75 and be repeated every 5 years. If your lipid or cholesterol levels are high, you are over age 15, or you are at high risk for heart disease, you may need your cholesterol levels checked more frequently.Ongoing high lipid and cholesterol levels should be treated with medicines if diet  and exercise are not working.  If you smoke, find out from your health care provider how to quit. If you do not use tobacco, do not start.  Lung cancer screening is recommended for adults aged 65-80 years who are at high risk for developing lung cancer because of a history of smoking. A yearly low-dose CT scan of the lungs is recommended for people who have at least a 30-pack-year history of smoking and are current smokers or have quit within the past 15 years. A pack year of smoking is smoking an average of 1 pack of cigarettes a day for 1 year (for example, a 30-pack-year history of smoking could mean smoking 1 pack a day for 30 years or 2 packs a day for 15 years). Yearly screening should continue until the smoker has stopped smoking for at least 15 years. Yearly screening should be stopped for people who develop a health problem that would prevent them from having lung cancer treatment.  If you choose to drink alcohol, do not have more than 2 drinks per day. One drink is considered to be 12 oz (360 mL) of beer, 5 oz (150 mL) of wine, or 1.5 oz (45 mL) of liquor.  Avoid the use of street drugs. Do not share needles with anyone. Ask for help if you need support or instructions about stopping the use of drugs.  High blood pressure causes heart disease and increases the risk of stroke. High blood pressure is more likely to develop in:  People  who have blood pressure in the end of the normal range (100-139/85-89 mm Hg).  People who are overweight or obese.  People who are African American.  If you are 23-27 years of age, have your blood pressure checked every 3-5 years. If you are 82 years of age or older, have your blood pressure checked every year. You should have your blood pressure measured twice--once when you are at a hospital or clinic, and once when you are not at a hospital or clinic. Record the average of the two measurements. To check your blood pressure when you are not at a hospital or  clinic, you can use:  An automated blood pressure machine at a pharmacy.  A home blood pressure monitor.  If you are 70-45 years old, ask your health care provider if you should take aspirin to prevent heart disease.  Diabetes screening involves taking a blood sample to check your fasting blood sugar level. This should be done once every 3 years after age 84 if you are at a normal weight and without risk factors for diabetes. Testing should be considered at a younger age or be carried out more frequently if you are overweight and have at least 1 risk factor for diabetes.  Colorectal cancer can be detected and often prevented. Most routine colorectal cancer screening begins at the age of 71 and continues through age 11. However, your health care provider may recommend screening at an earlier age if you have risk factors for colon cancer. On a yearly basis, your health care provider may provide home test kits to check for hidden blood in the stool. A small camera at the end of a tube may be used to directly examine the colon (sigmoidoscopy or colonoscopy) to detect the earliest forms of colorectal cancer. Talk to your health care provider about this at age 24 when routine screening begins. A direct exam of the colon should be repeated every 5-10 years through age 56, unless early forms of precancerous polyps or small growths are found.  People who are at an increased risk for hepatitis B should be screened for this virus. You are considered at high risk for hepatitis B if:  You were born in a country where hepatitis B occurs often. Talk with your health care provider about which countries are considered high risk.  Your parents were born in a high-risk country and you have not received a shot to protect against hepatitis B (hepatitis B vaccine).  You have HIV or AIDS.  You use needles to inject street drugs.  You live with, or have sex with, someone who has hepatitis B.  You are a man who has  sex with other men (MSM).  You get hemodialysis treatment.  You take certain medicines for conditions like cancer, organ transplantation, and autoimmune conditions.  Hepatitis C blood testing is recommended for all people born from [birth year] through 1963 and any individual with known risk factors for hepatitis C.  Healthy men should no longer receive prostate-specific antigen (PSA) blood tests as part of routine cancer screening. Talk to your health care provider about prostate cancer screening.  Testicular cancer screening is not recommended for adolescents or adult males who have no symptoms. Screening includes self-exam, a health care provider exam, and other screening tests. Consult with your health care provider about any symptoms you have or any concerns you have about testicular cancer.  Practice safe sex. Use condoms and avoid high-risk sexual practices to reduce the spread of sexually  transmitted infections (STIs).  You should be screened for STIs, including gonorrhea and chlamydia if:  You are sexually active and are younger than 24 years.  You are older than 24 years, and your health care provider tells you that you are at risk for this type of infection.  Your sexual activity has changed since you were last screened, and you are at an increased risk for chlamydia or gonorrhea. Ask your health care provider if you are at risk.  If you are at risk of being infected with HIV, it is recommended that you take a prescription medicine daily to prevent HIV infection. This is called pre-exposure prophylaxis (PrEP). You are considered at risk if:  You are a man who has sex with other men (MSM).  You are a heterosexual man who is sexually active with multiple partners.  You take drugs by injection.  You are sexually active with a partner who has HIV.  Talk with your health care provider about whether you are at high risk of being infected with HIV. If you choose to begin PrEP, you should  first be tested for HIV. You should then be tested every 3 months for as long as you are taking PrEP.  Use sunscreen. Apply sunscreen liberally and repeatedly throughout the day. You should seek shade when your shadow is shorter than you. Protect yourself by wearing long sleeves, pants, a wide-brimmed hat, and sunglasses year round whenever you are outdoors.  Tell your health care provider of new moles or changes in moles, especially if there is a change in shape or color. Also, tell your health care provider if a mole is larger than the size of a pencil eraser.  A one-time screening for abdominal aortic aneurysm (AAA) and surgical repair of large AAAs by ultrasound is recommended for men aged 92-75 years who are current or former smokers.  Stay current with your vaccines (immunizations).   This information is not intended to replace advice given to you by your health care provider. Make sure you discuss any questions you have with your health care provider.   Document Released: 12/14/2007 Document Revised: 07/08/2014 Document Reviewed: 11/12/2010 Elsevier Interactive Patient Education [employer].

## 2015-04-18 NOTE — Progress Notes (Signed)
Pre visit review using our clinic review tool, if applicable. No additional management support is needed unless otherwise documented below in the visit note. 

## 2015-04-18 NOTE — Assessment & Plan Note (Signed)
Flu and tdap given at visit. Talked to him about skin cancer risk reduction. He is going to see dermatology for full body examination next week. Checking labs today and screening recommendations discussed with him. He elects screening for hepatitis C. Talked to him about shingles vaccine and the need to wait about 1 month after flu immunization.

## 2015-04-18 NOTE — Progress Notes (Signed)
   Subjective:    Patient ID: [name], male    DOB: Aug 19, 1950, 64 y.o.   MRN: [id number]  HPI The patient is a 64 YO male coming in for wellness. No new concerns.   PMH, [facility], social history reviewed and updated.   Review of Systems  Constitutional: Negative for fever, activity change, appetite change, fatigue and unexpected weight change.  HENT: Negative for postnasal drip, rhinorrhea and sinus pressure.   Respiratory: Negative for cough, chest tightness, shortness of breath and wheezing.   Cardiovascular: Negative for chest pain, palpitations and leg swelling.  Gastrointestinal: Negative for abdominal pain, diarrhea, constipation, blood in stool and abdominal distention.  Genitourinary: Negative.   Musculoskeletal: Positive for arthralgias. Negative for myalgias and gait problem.  Skin: Negative.   Neurological: Negative for dizziness, weakness and light-headedness.      Objective:   Physical Exam  Constitutional: He is oriented to person, place, and time. He appears well-developed and well-nourished. No distress.  HENT:  Head: Normocephalic and atraumatic.  Eyes: EOM are normal.  Neck: Normal range of motion.  Cardiovascular: Normal rate and regular rhythm.   No murmur heard. Pulmonary/Chest: Effort normal and breath sounds normal. No respiratory distress. He has no wheezes. He has no rales.  Abdominal: Soft. Bowel sounds are normal. He exhibits no distension. There is no tenderness. There is no rebound.  Neurological: He is alert and oriented to person, place, and time. Coordination normal.  Skin: Skin is warm and dry.  Vitals reviewed.  Filed Vitals:   04/18/15 0827  BP: 112/70  Pulse: 81  Temp: 97.8 F (36.6 C)  TempSrc: Oral  Resp: 14  Height: 5\' 9"  (1.753 m)  Weight: 189 lb 6.4 oz (85.911 kg)  SpO2: 98%      Assessment & Plan:  Flu and tdap given at visit.

## 2015-04-20 ENCOUNTER — Other Ambulatory Visit (INDEPENDENT_AMBULATORY_CARE_PROVIDER_SITE_OTHER): Payer: BLUE CROSS/BLUE SHIELD

## 2015-04-20 DIAGNOSIS — Z0000 Encounter for general adult medical examination without abnormal findings: Secondary | ICD-10-CM

## 2015-04-20 LAB — LIPID PANEL
CHOLESTEROL: 152 mg/dL (ref 0–200)
HDL: 39.6 mg/dL (ref >39.00)
LDL Cholesterol: 77 mg/dL (ref 0–99)
NonHDL: 112.68
TRIGLYCERIDES: 178 mg/dL — AB (ref 0.0–149.0)
Total CHOL/HDL Ratio: 4
VLDL: 35.6 mg/dL (ref 0.0–40.0)

## 2015-04-20 LAB — COMPREHENSIVE METABOLIC PANEL
ALBUMIN: 4.2 g/dL (ref 3.5–5.2)
ALT: 19 U/L (ref 0–53)
AST: 16 U/L (ref 0–37)
Alkaline Phosphatase: 92 U/L (ref 39–117)
BUN: 12 mg/dL (ref 6–23)
CALCIUM: 8.9 mg/dL (ref 8.4–10.5)
CO2: 27 mEq/L (ref 19–32)
CREATININE: 1.15 mg/dL (ref 0.40–1.50)
Chloride: 105 mEq/L (ref 96–112)
GFR: 68.48 mL/min (ref >60.00)
Glucose, Bld: 103 mg/dL — ABNORMAL HIGH (ref 70–99)
Potassium: 4.1 mEq/L (ref 3.5–5.1)
Sodium: 139 mEq/L (ref 135–145)
TOTAL PROTEIN: 7 g/dL (ref 6.0–8.3)
Total Bilirubin: 0.4 mg/dL (ref 0.2–1.2)

## 2015-04-21 LAB — HEPATITIS C ANTIBODY: HCV AB: NEGATIVE

## 2015-05-09 ENCOUNTER — Telehealth: Payer: Self-pay | Admitting: Internal Medicine

## 2015-05-09 DIAGNOSIS — L409 Psoriasis, unspecified: Secondary | ICD-10-CM

## 2015-05-09 NOTE — Telephone Encounter (Signed)
Order placed

## 2015-05-09 NOTE — Telephone Encounter (Signed)
Patient is requesting referral to Dr. Gavin Pound [facility].  States Dr. Sharlet Salina suggested he see a Rheumatologist but office will not make appointment unless he gets a referral.

## 2015-05-09 NOTE — Telephone Encounter (Signed)
Notified patient.

## 2015-06-07 ENCOUNTER — Other Ambulatory Visit: Payer: Self-pay | Admitting: Gastroenterology

## 2015-07-21 ENCOUNTER — Encounter: Payer: Self-pay | Admitting: Gastroenterology

## 2015-09-10 ENCOUNTER — Other Ambulatory Visit: Payer: Self-pay | Admitting: Gastroenterology

## 2015-12-10 ENCOUNTER — Other Ambulatory Visit: Payer: Self-pay | Admitting: Gastroenterology

## 2016-01-19 DIAGNOSIS — F4323 Adjustment disorder with mixed anxiety and depressed mood: Secondary | ICD-10-CM | POA: Diagnosis not present

## 2016-03-02 ENCOUNTER — Other Ambulatory Visit: Payer: Self-pay | Admitting: Gastroenterology

## 2016-03-05 ENCOUNTER — Telehealth: Payer: Self-pay | Admitting: Gastroenterology

## 2016-03-05 MED ORDER — RANITIDINE HCL 300 MG PO TABS: 300.0000 mg | ORAL_TABLET | Freq: Every day | ORAL | 0 refills | Status: DC

## 2016-03-05 NOTE — Telephone Encounter (Signed)
Prescription sent to patient's mail order pharmacy until upcoming appt. Patient notified.

## 2016-03-13 ENCOUNTER — Telehealth: Payer: Self-pay | Admitting: Gastroenterology

## 2016-03-13 NOTE — Telephone Encounter (Signed)
Left message for patient to call back  

## 2016-03-14 NOTE — Telephone Encounter (Signed)
Left message for patient to call back  

## 2016-03-18 NOTE — Telephone Encounter (Signed)
No return call from patient.  Can discuss EGD at office visit in October

## 2016-04-11 ENCOUNTER — Ambulatory Visit (INDEPENDENT_AMBULATORY_CARE_PROVIDER_SITE_OTHER): Payer: BLUE CROSS/BLUE SHIELD | Admitting: Gastroenterology

## 2016-04-11 ENCOUNTER — Encounter: Payer: Self-pay | Admitting: Gastroenterology

## 2016-04-11 VITALS — BP 110/70 | HR 82 | Ht 69.0 in | Wt 183.6 lb

## 2016-04-11 DIAGNOSIS — K2270 Barrett's esophagus without dysplasia: Secondary | ICD-10-CM | POA: Diagnosis not present

## 2016-04-11 DIAGNOSIS — K219 Gastro-esophageal reflux disease without esophagitis: Secondary | ICD-10-CM

## 2016-04-11 MED ORDER — LACTINEX PO CHEW: 1.0000 | CHEWABLE_TABLET | Freq: Every day | ORAL | 3 refills | Status: DC

## 2016-04-11 MED ORDER — PANTOPRAZOLE SODIUM 40 MG PO TBEC: DELAYED_RELEASE_TABLET | ORAL | 3 refills | Status: DC

## 2016-04-11 MED ORDER — RANITIDINE HCL 300 MG PO TABS: 300.0000 mg | ORAL_TABLET | Freq: Every day | ORAL | 3 refills | Status: DC

## 2016-04-11 NOTE — Patient Instructions (Addendum)
We have sent the following prescriptions to your mail in pharmacy:Lactinex, Protonix and Zantac.   If you have not heard from your mail in pharmacy within 1 week or if you have not received your medication in the mail, please contact us at [phone] so we may find out why.   You have been scheduled for an endoscopy. Please follow written instructions given to you at your visit today. If you use inhalers (even only as needed), please bring them with you on the day of your procedure. Your physician has requested that you go to www.startemmi.com and enter the access code given to you at your visit today. This web site gives a general overview about your procedure. However, you should still follow specific instructions given to you by our office regarding your preparation for the procedure.  Thank you for choosing me and [facility].  Pricilla Riffle. Dagoberto Ligas., MD., Marval Regal

## 2016-04-11 NOTE — Progress Notes (Signed)
    History of Present Illness: This is a 65 year old male returning for follow-up of GERD and Barrett's esophagus. He is accompanied by his wife. His reflux symptoms are under excellent control on his current regimen. His 3 year surveillance endoscopy was due in January. He relates that his 83 year old son passed away in 2022/12/09. He has noted feelings of chest heaviness in the mornings when he awakens since his son past away. Has not noted chest pain with exertion or dyspnea with exertion.   Current Medications, Allergies, Past Medical History, Past Surgical History, Family History and Social History were reviewed in [employer] record.  Physical Exam: General: Well developed, well nourished, no acute distress Head: Normocephalic and atraumatic Eyes:  sclerae anicteric, EOMI Ears: Normal auditory acuity Mouth: No deformity or lesions Lungs: Clear throughout to auscultation Heart: Regular rate and rhythm; no murmurs, rubs or bruits Abdomen: Soft, non tender and non distended. No masses, hepatosplenomegaly or hernias noted. Normal Bowel sounds Musculoskeletal: Symmetrical with no gross deformities  Pulses:  Normal pulses noted Extremities: No clubbing, cyanosis, edema or deformities noted Neurological: Alert oriented x 4, grossly nonfocal Psychological:  Alert and cooperative. Normal mood and affect  Assessment and Recommendations:  1. Barrett's and GERD. Continue pantoprazole 40 mg twice daily and ranitidine 300 mg at bedtime. Carafate when necessary. Follow all standard antireflux measures. He requests a refill of Lactinex. Overdue for 3 year surveillance EGD. The risks (including bleeding, perforation, infection, missed lesions, medication reactions and possible hospitalization or surgery if complications occur), benefits, and alternatives to endoscopy with possible biopsy and possible dilation were discussed with the patient and they consent to proceed.   2. Chest  heaviness. Advised prompt evaluation by his PCP. He states he will arrange this appointment.  3. Personal history of adenomatous colon polyps. Five-year interval surveillance colonoscopy due in January 2019.

## 2016-04-19 ENCOUNTER — Encounter: Payer: BLUE CROSS/BLUE SHIELD | Admitting: Internal Medicine

## 2016-04-29 ENCOUNTER — Encounter: Payer: Self-pay | Admitting: Gastroenterology

## 2016-05-07 ENCOUNTER — Encounter: Payer: BLUE CROSS/BLUE SHIELD | Admitting: Gastroenterology

## 2016-05-13 ENCOUNTER — Telehealth: Payer: Self-pay | Admitting: Internal Medicine

## 2016-05-13 ENCOUNTER — Ambulatory Visit (AMBULATORY_SURGERY_CENTER): Payer: BLUE CROSS/BLUE SHIELD | Admitting: Gastroenterology

## 2016-05-13 ENCOUNTER — Encounter: Payer: Self-pay | Admitting: Gastroenterology

## 2016-05-13 VITALS — BP 131/83 | HR 67 | Temp 97.7°F | Resp 17 | Ht 69.0 in | Wt 183.0 lb

## 2016-05-13 DIAGNOSIS — K2270 Barrett's esophagus without dysplasia: Secondary | ICD-10-CM | POA: Diagnosis not present

## 2016-05-13 MED ORDER — SODIUM CHLORIDE 0.9 % IV SOLN: 500.0000 mL | INTRAVENOUS | Status: DC

## 2016-05-13 NOTE — Patient Instructions (Signed)
YOU HAD AN ENDOSCOPIC PROCEDURE TODAY AT THE  ENDOSCOPY CENTER:   Refer to the procedure report that was given to you for any specific questions about what was found during the examination.  If the procedure report does not answer your questions, please call your gastroenterologist to clarify.  If you requested that your care partner not be given the details of your procedure findings, then the procedure report has been included in a sealed envelope for you to review at your convenience later.  YOU SHOULD EXPECT: Some feelings of bloating in the abdomen. Passage of more gas than usual.  Walking can help get rid of the air that was put into your GI tract during the procedure and reduce the bloating. If you had a lower endoscopy (such as a colonoscopy or flexible sigmoidoscopy) you may notice spotting of blood in your stool or on the toilet paper. If you underwent a bowel prep for your procedure, you may not have a normal bowel movement for a few days.  Please Note:  You might notice some irritation and congestion in your nose or some drainage.  This is from the oxygen used during your procedure.  There is no need for concern and it should clear up in a day or so.  SYMPTOMS TO REPORT IMMEDIATELY:     Following upper endoscopy (EGD)  Vomiting of blood or coffee ground material  New chest pain or pain under the shoulder blades  Painful or persistently difficult swallowing  New shortness of breath  Fever of 100F or higher  Black, tarry-looking stools  For urgent or emergent issues, a gastroenterologist can be reached at any hour by calling [phone].   DIET:  We do recommend a small meal at first, but then you may proceed to your regular diet.  Drink plenty of fluids but you should avoid alcoholic beverages for 24 hours.  ACTIVITY:  You should plan to take it easy for the rest of today and you should NOT DRIVE or use heavy machinery until tomorrow (because of the sedation medicines  used during the test).    FOLLOW UP: Our staff will call the number listed on your records the next business day following your procedure to check on you and address any questions or concerns that you may have regarding the information given to you following your procedure. If we do not reach you, we will leave a message.  However, if you are feeling well and you are not experiencing any problems, there is no need to return our call.  We will assume that you have returned to your regular daily activities without incident.  If any biopsies were taken you will be contacted by phone or by letter within the next 1-3 weeks.  Please call us at [phone] if you have not heard about the biopsies in 3 weeks.    SIGNATURES/CONFIDENTIALITY: You and/or your care partner have signed paperwork which will be entered into your electronic medical record.  These signatures attest to the fact that that the information above on your After Visit Summary has been reviewed and is understood.  Full responsibility of the confidentiality of this discharge information lies with you and/or your care-partner.   Resume medications. 

## 2016-05-13 NOTE — Telephone Encounter (Signed)
Pt request to transfer from Dr. Sharlet Salina to Dr. Ronnald Ramp. Pt prefer male doctor. Please advise.

## 2016-05-13 NOTE — Op Note (Signed)
[name] Patient Name: [name] Procedure Date: 05/13/2016 9:49 AM MRN: [id number] Endoscopist: Ladene Artist , MD Age: 65 Referring MD:  Date of Birth: Aug 27, 1950 Gender: Male Account #: [id number] Procedure:                Upper GI endoscopy Indications:              Follow-up of Barrett's esophagus Medicines:                Monitored Anesthesia Care Procedure:                Pre-Anesthesia Assessment:                           - Prior to the procedure, a History and Physical                            was performed, and patient medications and                            allergies were reviewed. The patient's tolerance of                            previous anesthesia was also reviewed. The risks                            and benefits of the procedure and the sedation                            options and risks were discussed with the patient.                            All questions were answered, and informed consent                            was obtained. Prior Anticoagulants: The patient has                            taken no previous anticoagulant or antiplatelet                            agents. ASA Grade Assessment: II - A patient with                            mild systemic disease. After reviewing the risks                            and benefits, the patient was deemed in                            satisfactory condition to undergo the procedure.                           After obtaining informed consent, the endoscope was  passed under direct vision. Throughout the                            procedure, the patient's blood pressure, pulse, and                            oxygen saturations were monitored continuously. The                            Model GIF-HQ190 [id number]) scope was introduced                            through the mouth, and advanced to the second part                            of duodenum. The  upper GI endoscopy was                            accomplished without difficulty. The patient                            tolerated the procedure well. Scope In: Scope Out: Findings:                 There were esophageal mucosal changes secondary to                            established short-segment Barrett's disease present                            in the distal esophagus. The maximum longitudinal                            extent of these mucosal changes was 3 cm in length.                            Mucosa was biopsied with a cold forceps for                            histology in 4 quadrants at intervals of 1 cm in                            the lower third of the esophagus. One specimen                            bottle was sent to pathology.                           The exam of the esophagus was otherwise normal.                           Evidence of a Nissen fundoplication was found in  the cardia. The wrap appeared not intact. This was                            traversed.                           A medium-sized hiatal hernia was present.                           The exam of the stomach was otherwise normal.                           The duodenal bulb and second portion of the                            duodenum were normal. Complications:            No immediate complications. Estimated Blood Loss:     Estimated blood loss was minimal. Impression:               - Esophageal mucosal changes secondary to                            established short-segment Barrett's. Biopsied.                           - A Nissen fundoplication was found. The wrap                            appears not intact.                           - Medium-sized hiatal hernia.                           - Normal duodenal bulb and second portion of the                            duodenum. Recommendation:           - Patient has a contact number available for                             emergencies. The signs and symptoms of potential                            delayed complications were discussed with the                            patient. Return to normal activities tomorrow.                            Written discharge instructions were provided to the                            patient.                           -  Resume previous diet.                           - Continue present medications.                           - Await pathology results.                           - Repeat upper endoscopy in 3 years for                            surveillance. Ladene Artist, MD 05/13/2016 10:10:19 AM This report has been signed electronically.

## 2016-05-13 NOTE — Telephone Encounter (Signed)
Ok by me

## 2016-05-13 NOTE — Progress Notes (Signed)
A/ox3, pleased with MAC, report to RN 

## 2016-05-13 NOTE — Progress Notes (Signed)
Called to room to assist during endoscopic procedure.  Patient ID and intended procedure confirmed with present staff. Received instructions for my participation in the procedure from the performing physician.  

## 2016-05-13 NOTE — Telephone Encounter (Signed)
Fine with me

## 2016-05-14 ENCOUNTER — Telehealth: Payer: Self-pay | Admitting: *Deleted

## 2016-05-14 NOTE — Telephone Encounter (Signed)
  Follow up Call-  Call back number 05/13/2016  Post procedure Call Back phone  # [phone]  Permission to leave phone message Yes  Some recent data might be hidden     Patient questions:  Do you have a fever, pain , or abdominal swelling? No. Pain Score  2 *  Have you tolerated food without any problems? Yes.    Have you been able to return to your normal activities? Yes.    Do you have any questions about your discharge instructions: Diet   No. Medications  No. Follow up visit  No.  Do you have questions or concerns about your Care? No.  Actions: * If pain score is 4 or above: No action needed, pain <4.   Pt. States that he has some "soreness" in diaphragm area.  Rates as "2".  States  He will watch it and call us if pain increases.

## 2016-05-27 ENCOUNTER — Encounter: Payer: Self-pay | Admitting: Gastroenterology

## 2016-06-13 ENCOUNTER — Other Ambulatory Visit (INDEPENDENT_AMBULATORY_CARE_PROVIDER_SITE_OTHER): Payer: BLUE CROSS/BLUE SHIELD

## 2016-06-13 ENCOUNTER — Encounter: Payer: Self-pay | Admitting: Internal Medicine

## 2016-06-13 ENCOUNTER — Ambulatory Visit (INDEPENDENT_AMBULATORY_CARE_PROVIDER_SITE_OTHER): Payer: BLUE CROSS/BLUE SHIELD | Admitting: Internal Medicine

## 2016-06-13 VITALS — BP 120/80 | HR 107 | Temp 97.8°F | Ht 69.0 in | Wt 185.5 lb

## 2016-06-13 DIAGNOSIS — Z0000 Encounter for general adult medical examination without abnormal findings: Secondary | ICD-10-CM | POA: Diagnosis not present

## 2016-06-13 DIAGNOSIS — Z634 Disappearance and death of family member: Secondary | ICD-10-CM

## 2016-06-13 DIAGNOSIS — Z23 Encounter for immunization: Secondary | ICD-10-CM

## 2016-06-13 DIAGNOSIS — F4321 Adjustment disorder with depressed mood: Secondary | ICD-10-CM

## 2016-06-13 LAB — LIPID PANEL
Cholesterol: 162 mg/dL (ref 0–200)
HDL: 48.6 mg/dL (ref >39.00)
LDL Cholesterol: 89 mg/dL (ref 0–99)
NONHDL: 113.48
TRIGLYCERIDES: 124 mg/dL (ref 0.0–149.0)
Total CHOL/HDL Ratio: 3
VLDL: 24.8 mg/dL (ref 0.0–40.0)

## 2016-06-13 LAB — URINALYSIS, ROUTINE W REFLEX MICROSCOPIC
BILIRUBIN URINE: NEGATIVE
Hgb urine dipstick: NEGATIVE
Ketones, ur: NEGATIVE
Leukocytes, UA: NEGATIVE
NITRITE: NEGATIVE
SPECIFIC GRAVITY, URINE: 1.02 (ref 1.000–1.030)
TOTAL PROTEIN, URINE-UPE24: NEGATIVE
Urine Glucose: NEGATIVE
Urobilinogen, UA: 0.2 (ref 0.0–1.0)
WBC UA: NONE SEEN — AB (ref 0–?)
pH: 6 (ref 5.0–8.0)

## 2016-06-13 LAB — COMPREHENSIVE METABOLIC PANEL
ALK PHOS: 86 U/L (ref 39–117)
ALT: 15 U/L (ref 0–53)
AST: 14 U/L (ref 0–37)
Albumin: 4.5 g/dL (ref 3.5–5.2)
BUN: 13 mg/dL (ref 6–23)
CO2: 30 mEq/L (ref 19–32)
CREATININE: 1.21 mg/dL (ref 0.40–1.50)
Calcium: 9.2 mg/dL (ref 8.4–10.5)
Chloride: 103 mEq/L (ref 96–112)
GFR: 64.33 mL/min (ref >60.00)
GLUCOSE: 84 mg/dL (ref 70–99)
Potassium: 3.8 mEq/L (ref 3.5–5.1)
Sodium: 139 mEq/L (ref 135–145)
TOTAL PROTEIN: 7 g/dL (ref 6.0–8.3)
Total Bilirubin: 0.8 mg/dL (ref 0.2–1.2)

## 2016-06-13 LAB — PSA: PSA: 0.49 ng/mL (ref 0.10–4.00)

## 2016-06-13 LAB — TSH: TSH: 1.17 u[IU]/mL (ref 0.35–4.50)

## 2016-06-13 NOTE — Progress Notes (Signed)
Subjective:  Patient ID: [name], male    DOB: [birth year]  Age: 65 y.o. MRN: [id number]  CC: Annual Exam   HPI [name] presents for CPX, new to me.  His son died from an OD about 6 months ago - he is still grieving and feels irritable, has had some weight loss, and has crying spells. His sleep is normal, he denies anhedonia/SI/HI, or feeling hopeless/helpless. He has never been treated for depression. He took a few doses of his wife's paxil and did not like the was it made him feel (it caused anxiety).  History Cye has a past medical history of Barrett's esophagus (07/2012); Diverticulosis; Erosive esophagitis; GERD (gastroesophageal reflux disease); GI bleed; S/P laparoscopic cholecystectomy for gangrenous GB complicated by bile leak (06/07/2011); and Tubular adenoma of colon (07/2012).   He has a past surgical history that includes Cholecystectomy; Nissen fundoplication; Hernia repair; Esophagogastroduodenoscopy (10/08/2011); and Colonoscopy.   His family history includes Alcohol abuse in his father; Breast cancer in his maternal aunt; Cancer in his mother; Drug abuse in his son; Early death (age of onset: 77) in his son; Esophageal cancer in his paternal uncle; Heart disease in his mother; Lung cancer in his mother; Pancreatitis in his father.He reports that he has never smoked. He has never used smokeless tobacco. He reports that he does not drink alcohol or use drugs.  Outpatient Medications Prior to Visit  Medication Sig Dispense Refill  . aspirin EC 325 MG tablet Take 325 mg by mouth daily.    Marland Kitchen lactobacillus acidophilus & bulgar (LACTINEX) chewable tablet Chew 1 tablet by mouth daily. 90 tablet 3  . pantoprazole (PROTONIX) 40 MG tablet TAKE 1 TABLET TWICE A DAY BEFORE MEALS 180 tablet 3  . ranitidine (ZANTAC) 300 MG tablet Take 1 tablet (300 mg total) by mouth at bedtime. 90 tablet 3  . sucralfate (CARAFATE) 1 G tablet Take 1 tablet (1 g total) by mouth 4 (four)  times daily -  with meals and at bedtime. (Patient taking differently: Take 1 g by mouth as needed. ) 120 tablet 11  . 0.9 %  sodium chloride infusion      No facility-administered medications prior to visit.     ROS Review of Systems  Constitutional: Positive for unexpected weight change. Negative for appetite change, chills, diaphoresis and fatigue.  Eyes: Negative.  Negative for visual disturbance.  Respiratory: Negative for cough, chest tightness, shortness of breath, wheezing and stridor.   Cardiovascular: Negative for chest pain, palpitations and leg swelling.  Gastrointestinal: Negative for abdominal pain, blood in stool, constipation, diarrhea, nausea and vomiting.  Endocrine: Negative.   Genitourinary: Negative.  Negative for difficulty urinating, discharge, dysuria, hematuria, penile pain, scrotal swelling, testicular pain and urgency.  Musculoskeletal: Negative for back pain, myalgias and neck pain.  Skin: Negative.  Negative for color change and rash.  Allergic/Immunologic: Negative.   Neurological: Negative.  Negative for dizziness, weakness and light-headedness.  Hematological: Negative.  Negative for adenopathy. Does not bruise/bleed easily.  Psychiatric/Behavioral: Positive for dysphoric mood. Negative for agitation, behavioral problems, confusion, decreased concentration, hallucinations, self-injury, sleep disturbance and suicidal ideas. The patient is not nervous/anxious and is not hyperactive.     Objective:  BP 120/80 (BP Location: Left Arm, Patient Position: Sitting, Cuff Size: Normal)   Pulse (!) 107   Temp 97.8 F (36.6 C) (Oral)   Ht 5\' 9"  (1.753 m)   Wt 185 lb 8 oz (84.1 kg)   SpO2 98%  BMI 27.39 kg/m   Physical Exam  Constitutional: He is oriented to person, place, and time. No distress.  HENT:  Mouth/Throat: Oropharynx is clear and moist. No oropharyngeal exudate.  Eyes: Conjunctivae are normal. Right eye exhibits no discharge. Left eye exhibits no  discharge. No scleral icterus.  Neck: Normal range of motion. Neck supple. No JVD present. No tracheal deviation present. No thyromegaly present.  Cardiovascular: Normal rate, regular rhythm, normal heart sounds and intact distal pulses.  Exam reveals no gallop and no friction rub.   No murmur heard. Pulmonary/Chest: Effort normal and breath sounds normal. No stridor. No respiratory distress. He has no wheezes. He has no rales. He exhibits no tenderness.  Abdominal: Soft. Bowel sounds are normal. He exhibits no distension and no mass. There is no tenderness. There is no rebound and no guarding. Hernia confirmed negative in the right inguinal area and confirmed negative in the left inguinal area.  Genitourinary: Rectum normal, prostate normal, testes normal and penis normal. Rectal exam shows no external hemorrhoid, no internal hemorrhoid, no fissure, no mass, no tenderness, anal tone normal and guaiac negative stool. Prostate is not enlarged and not tender. Right testis shows no mass, no swelling and no tenderness. Right testis is descended. Left testis shows no mass, no swelling and no tenderness. Left testis is descended. Circumcised. No penile erythema or penile tenderness. No discharge found.  Musculoskeletal: Normal range of motion. He exhibits no edema, tenderness or deformity.  Lymphadenopathy:    He has no cervical adenopathy.       Right: No inguinal adenopathy present.       Left: No inguinal adenopathy present.  Neurological: He is oriented to person, place, and time.  Skin: Skin is warm and dry. No rash noted. He is not diaphoretic. No erythema. No pallor.  Psychiatric: He has a normal mood and affect. His behavior is normal. Judgment and thought content normal. His mood appears not anxious. His speech is not rapid and/or pressured. He is not slowed and not withdrawn. He does not exhibit a depressed mood. He expresses no homicidal and no suicidal ideation. He expresses no suicidal plans and  no homicidal plans. He is attentive.  Vitals reviewed.   Lab Results  Component Value Date   WBC 7.8 06/13/2016   HGB 16.6 06/13/2016   HCT 48.7 06/13/2016   PLT 305.0 06/13/2016   GLUCOSE 84 06/13/2016   CHOL 162 06/13/2016   TRIG 124.0 06/13/2016   HDL 48.60 06/13/2016   LDLCALC 89 06/13/2016   ALT 15 06/13/2016   AST 14 06/13/2016   NA 139 06/13/2016   K 3.8 06/13/2016   CL 103 06/13/2016   CREATININE 1.21 06/13/2016   BUN 13 06/13/2016   CO2 30 06/13/2016   TSH 1.17 06/13/2016   PSA 0.49 06/13/2016   INR 1.26 10/08/2011   HGBA1C 4.9 10/08/2011    Assessment & Plan:   Seymour was seen today for annual exam.  Diagnoses and all orders for this visit:  Routine general medical examination at a health care facility- exam completed, labs reviewed, vaccines were reviewed and updated, his colonoscopy is UTD, pt ed material was given -     Lipid panel; Future -     Comprehensive metabolic panel; Future -     CBC with Differential/Platelet; Future -     PSA; Future -     Urinalysis, Routine w reflex microscopic; Future -     TSH; Future  Grief at loss of child-  he is experiencing protracted grief and possibly mild depression but he does not want to start an antidepressant, he is doing psychotherapy, I have asked him to return in a few months for a recheck and to let me know if his sx's worsen and is willing to start am antidepressant  Need for prophylactic vaccination and inoculation against influenza -     Flu Vaccine QUAD 36+ mos IM   I am having Mr. Chima maintain his sucralfate, pantoprazole, ranitidine, lactobacillus acidophilus & bulgar, and aspirin EC. We will stop administering sodium chloride.  No orders of the defined types were placed in this encounter.    Follow-up: Return in about 2 months (around 08/14/2016).  Scarlette Calico, MD

## 2016-06-13 NOTE — Progress Notes (Signed)
Pre visit review using our clinic review tool, if applicable. No additional management support is needed unless otherwise documented below in the visit note. 

## 2016-06-13 NOTE — Patient Instructions (Signed)

## 2016-06-14 ENCOUNTER — Encounter: Payer: Self-pay | Admitting: Internal Medicine

## 2016-06-14 LAB — CBC WITH DIFFERENTIAL/PLATELET
BASOS ABS: 0 10*3/uL (ref 0.0–0.1)
Basophils Relative: 0.5 % (ref 0.0–3.0)
EOS ABS: 0.1 10*3/uL (ref 0.0–0.7)
Eosinophils Relative: 1.7 % (ref 0.0–5.0)
HCT: 48.7 % (ref 39.0–52.0)
Hemoglobin: 16.6 g/dL (ref 13.0–17.0)
LYMPHS ABS: 2.1 10*3/uL (ref 0.7–4.0)
Lymphocytes Relative: 26.8 % (ref 12.0–46.0)
MCHC: 34.2 g/dL (ref 30.0–36.0)
MCV: 94.8 fl (ref 78.0–100.0)
MONO ABS: 0.8 10*3/uL (ref 0.1–1.0)
MONOS PCT: 10.5 % (ref 3.0–12.0)
NEUTROS PCT: 60.5 % (ref 43.0–77.0)
Neutro Abs: 4.7 10*3/uL (ref 1.4–7.7)
Platelets: 305 10*3/uL (ref 150.0–400.0)
RBC: 5.13 Mil/uL (ref 4.22–5.81)
RDW: 12.8 % (ref 11.5–15.5)
WBC: 7.8 10*3/uL (ref 4.0–10.5)

## 2016-06-17 DIAGNOSIS — H5213 Myopia, bilateral: Secondary | ICD-10-CM | POA: Diagnosis not present

## 2016-06-17 DIAGNOSIS — H26491 Other secondary cataract, right eye: Secondary | ICD-10-CM | POA: Diagnosis not present

## 2016-06-17 DIAGNOSIS — H43813 Vitreous degeneration, bilateral: Secondary | ICD-10-CM | POA: Diagnosis not present

## 2016-06-17 DIAGNOSIS — H31003 Unspecified chorioretinal scars, bilateral: Secondary | ICD-10-CM | POA: Diagnosis not present

## 2016-12-09 ENCOUNTER — Ambulatory Visit: Payer: BLUE CROSS/BLUE SHIELD | Admitting: Physician Assistant

## 2016-12-10 ENCOUNTER — Encounter (HOSPITAL_COMMUNITY): Payer: Self-pay | Admitting: Emergency Medicine

## 2016-12-10 ENCOUNTER — Telehealth (HOSPITAL_COMMUNITY): Payer: Self-pay | Admitting: Emergency Medicine

## 2016-12-10 ENCOUNTER — Ambulatory Visit (HOSPITAL_COMMUNITY)
Admission: EM | Admit: 2016-12-10 | Discharge: 2016-12-10 | Disposition: A | Discharge status: Home / Self Care | Payer: BLUE CROSS/BLUE SHIELD | Attending: Internal Medicine | Admitting: Internal Medicine

## 2016-12-10 DIAGNOSIS — S50861A Insect bite (nonvenomous) of right forearm, initial encounter: Secondary | ICD-10-CM

## 2016-12-10 DIAGNOSIS — L03113 Cellulitis of right upper limb: Secondary | ICD-10-CM | POA: Diagnosis not present

## 2016-12-10 DIAGNOSIS — W57XXXA Bitten or stung by nonvenomous insect and other nonvenomous arthropods, initial encounter: Secondary | ICD-10-CM | POA: Diagnosis not present

## 2016-12-10 MED ORDER — METHYLPREDNISOLONE ACETATE 80 MG/ML IJ SUSP
INTRAMUSCULAR | Status: AC
Start: 2016-12-10 — End: 2016-12-10
  Filled 2016-12-10: qty 1

## 2016-12-10 MED ORDER — DIPHENHYDRAMINE HCL 25 MG PO CAPS: 50.0000 mg | ORAL_CAPSULE | Freq: Four times a day (QID) | ORAL | 0 refills | Status: DC | PRN

## 2016-12-10 MED ORDER — DOXYCYCLINE HYCLATE 100 MG PO CAPS
100.0000 mg | ORAL_CAPSULE | Freq: Two times a day (BID) | ORAL | 0 refills | Status: DC
Start: 2016-12-10 — End: 2017-03-20

## 2016-12-10 MED ORDER — PREDNISONE 10 MG (21) PO TBPK
ORAL_TABLET | Freq: Every day | ORAL | 0 refills | Status: DC
Start: 2016-12-10 — End: 2017-03-20

## 2016-12-10 MED ORDER — METHYLPREDNISOLONE ACETATE 80 MG/ML IJ SUSP
80.0000 mg | Freq: Once | INTRAMUSCULAR | Status: AC
  Administered 2016-12-10: 80 mg via INTRAMUSCULAR

## 2016-12-10 NOTE — ED Provider Notes (Signed)
CSN: [id number]     Arrival date & time 12/10/16  1856 History   First MD Initiated Contact with Patient 12/10/16 1937     Chief Complaint  Patient presents with  . Insect Bite   (Consider location/radiation/quality/duration/timing/severity/associated sxs/prior Treatment) The history is provided by the patient.  Rash  Location:  Shoulder/arm Shoulder/arm rash location:  R forearm Quality: blistering, itchiness, redness, swelling and weeping   Severity:  Moderate Onset quality:  Gradual Duration:  4 days Timing:  Constant Progression:  Worsening Chronicity:  New Context: insect bite/sting and plant contact   Context: not medications and not new detergent/soap   Relieved by:  Antihistamines Associated symptoms: no abdominal pain, no fever, no nausea, no shortness of breath, no sore throat, no throat swelling, not vomiting and not wheezing     Past Medical History:  Diagnosis Date  . Barrett's esophagus 07/2012  . Diverticulosis   . Erosive esophagitis   . GERD (gastroesophageal reflux disease)   . GI bleed   . S/P laparoscopic cholecystectomy for gangrenous GB complicated by bile leak 06/07/2011  . Tubular adenoma of colon 07/2012   Past Surgical History:  Procedure Laterality Date  . CHOLECYSTECTOMY    . COLONOSCOPY    . ESOPHAGOGASTRODUODENOSCOPY  10/08/2011   Procedure: ESOPHAGOGASTRODUODENOSCOPY (EGD);  Surgeon: Ladene Artist, MD,FACG;  Location: [facility];  Service: Endoscopy;  Laterality: N/A;  . HERNIA REPAIR    . NISSEN FUNDOPLICATION     Family History  Problem Relation Age of Onset  . Breast cancer Maternal Aunt   . Lung cancer Mother   . Heart disease Mother   . Cancer Mother        lung  . Pancreatitis Father   . Alcohol abuse Father        pancreatitis  . Esophageal cancer Paternal Uncle   . Drug abuse Son   . Early death Son 36       cocaine OD  . Colon cancer Neg Hx    Social History  Substance Use Topics  . Smoking status: Never Smoker  .  Smokeless tobacco: Never Used  . Alcohol use No     Comment: Occasional    Review of Systems  Constitutional: Negative for chills and fever.  HENT: Negative for congestion, sinus pain, sinus pressure and sore throat.   Respiratory: Negative for shortness of breath and wheezing.   Cardiovascular: Negative for chest pain and palpitations.  Gastrointestinal: Negative for abdominal pain, nausea and vomiting.  Musculoskeletal: Negative.   Skin: Positive for rash.  Neurological: Negative.     Allergies  Patient has no known allergies.  Home Medications   Prior to Admission medications   Medication Sig Start Date End Date Taking? Authorizing Provider  pantoprazole (PROTONIX) 40 MG tablet TAKE 1 TABLET TWICE A DAY BEFORE MEALS 04/11/16  Yes Ladene Artist, MD  ranitidine (ZANTAC) 300 MG tablet Take 1 tablet (300 mg total) by mouth at bedtime. 04/11/16  Yes Ladene Artist, MD  diphenhydrAMINE (BENADRYL) 25 mg capsule Take 2 capsules (50 mg total) by mouth every 6 (six) hours as needed. 12/10/16   Barnet Glasgow, NP  doxycycline (VIBRAMYCIN) 100 MG capsule Take 1 capsule (100 mg total) by mouth 2 (two) times daily. 12/10/16   Barnet Glasgow, NP  predniSONE (STERAPRED UNI-PAK 21 TAB) 10 MG (21) TBPK tablet Take by mouth daily. Take 6 tabs by mouth daily  for 2 days, then 5 tabs for 2 days, then 4 tabs for  2 days, then 3 tabs for 2 days, 2 tabs for 2 days, then 1 tab by mouth daily for 2 days 12/10/16   Barnet Glasgow, NP   Meds Ordered and Administered this Visit   Medications  methylPREDNISolone acetate (DEPO-MEDROL) injection 80 mg (80 mg Intramuscular Given 12/10/16 2005)    BP 127/86 (BP Location: Left Arm)   Pulse 82   Temp 98.1 F (36.7 C) (Oral)   Resp 16   SpO2 100%  No data found.   Physical Exam  Constitutional: He is oriented to person, place, and time. He appears well-developed and well-nourished. No distress.  HENT:  Head: Normocephalic and atraumatic.    Right Ear: External ear normal.  Left Ear: External ear normal.  Eyes: Conjunctivae are normal.  Neck: Normal range of motion.  Neurological: He is alert and oriented to person, place, and time.  Skin: Skin is warm and dry. Capillary refill takes less than 2 seconds. He is not diaphoretic.     Right forearm swelling, and erythema, multiple small pustular lesions consistent with insect bites, general diffuse erythema consistent with a cellulitis.  Psychiatric: He has a normal mood and affect. His behavior is normal.  Nursing note and vitals reviewed.   Urgent Care Course     Procedures (including critical care time)  Labs Review Labs Reviewed - No data to display  Imaging Review No results found.    MDM   1. Insect bite, initial encounter   2. Cellulitis of right upper extremity    Imaging injection Depo-Medrol in clinic, started on Benadryl, prednisone, and doxycycline. Area of erythema marked with a skin marker, follow-up guidelines provided to the patient and encouraged to go to the ER if it spreads.    Barnet Glasgow, NP 12/10/16 2027

## 2016-12-10 NOTE — ED Triage Notes (Signed)
The patient presented to the [facility] with a complaint of pain and swelling to his right forearm due to possible insect bites 4 days ago.

## 2016-12-10 NOTE — Discharge Instructions (Signed)
For your insect bite, and celluliti., I prescribed doxycycline 1 tablet twice daily for 10 days, a 12 day taper prednisone take 6 tablets in the morning then decreased by one tablet every other day until finished, and Benadryl 2 tablets every 6 hours not to exceed 300 mg any 24-hour period. The edges have been marked, if your rash spreads outside of these margins, go to the emergency room.

## 2016-12-10 NOTE — Telephone Encounter (Signed)
Called in prescription for prednisone to patient's preferred pharmacy.

## 2017-03-20 ENCOUNTER — Ambulatory Visit (INDEPENDENT_AMBULATORY_CARE_PROVIDER_SITE_OTHER)
Admission: RE | Admit: 2017-03-20 | Discharge: 2017-03-20 | Disposition: A | Discharge status: Home / Self Care | Payer: BLUE CROSS/BLUE SHIELD | Source: Ambulatory Visit | Attending: Internal Medicine | Admitting: Internal Medicine

## 2017-03-20 ENCOUNTER — Other Ambulatory Visit (INDEPENDENT_AMBULATORY_CARE_PROVIDER_SITE_OTHER): Payer: BLUE CROSS/BLUE SHIELD

## 2017-03-20 ENCOUNTER — Ambulatory Visit (INDEPENDENT_AMBULATORY_CARE_PROVIDER_SITE_OTHER): Payer: BLUE CROSS/BLUE SHIELD | Admitting: Internal Medicine

## 2017-03-20 ENCOUNTER — Encounter: Payer: Self-pay | Admitting: Internal Medicine

## 2017-03-20 VITALS — BP 132/80 | HR 69 | Temp 98.5°F | Ht 69.0 in | Wt 187.0 lb

## 2017-03-20 DIAGNOSIS — R109 Unspecified abdominal pain: Secondary | ICD-10-CM | POA: Diagnosis not present

## 2017-03-20 DIAGNOSIS — S3991XA Unspecified injury of abdomen, initial encounter: Secondary | ICD-10-CM | POA: Diagnosis not present

## 2017-03-20 DIAGNOSIS — Z23 Encounter for immunization: Secondary | ICD-10-CM | POA: Diagnosis not present

## 2017-03-20 DIAGNOSIS — R310 Gross hematuria: Secondary | ICD-10-CM

## 2017-03-20 LAB — CBC WITH DIFFERENTIAL/PLATELET
Basophils Absolute: 0.1 10*3/uL (ref 0.0–0.1)
Basophils Relative: 0.8 % (ref 0.0–3.0)
EOS ABS: 0.1 10*3/uL (ref 0.0–0.7)
EOS PCT: 1 % (ref 0.0–5.0)
HEMATOCRIT: 47.3 % (ref 39.0–52.0)
HEMOGLOBIN: 15.9 g/dL (ref 13.0–17.0)
LYMPHS PCT: 24.6 % (ref 12.0–46.0)
Lymphs Abs: 1.9 10*3/uL (ref 0.7–4.0)
MCHC: 33.6 g/dL (ref 30.0–36.0)
MCV: 97.2 fl (ref 78.0–100.0)
MONO ABS: 0.6 10*3/uL (ref 0.1–1.0)
Monocytes Relative: 7.9 % (ref 3.0–12.0)
Neutro Abs: 5 10*3/uL (ref 1.4–7.7)
Neutrophils Relative %: 65.7 % (ref 43.0–77.0)
Platelets: 299 10*3/uL (ref 150.0–400.0)
RBC: 4.87 Mil/uL (ref 4.22–5.81)
RDW: 12.8 % (ref 11.5–15.5)
WBC: 7.7 10*3/uL (ref 4.0–10.5)

## 2017-03-20 LAB — COMPREHENSIVE METABOLIC PANEL
ALBUMIN: 4.4 g/dL (ref 3.5–5.2)
ALK PHOS: 98 U/L (ref 39–117)
ALT: 20 U/L (ref 0–53)
AST: 17 U/L (ref 0–37)
BUN: 12 mg/dL (ref 6–23)
CALCIUM: 9.2 mg/dL (ref 8.4–10.5)
CO2: 29 mEq/L (ref 19–32)
CREATININE: 1.16 mg/dL (ref 0.40–1.50)
Chloride: 99 mEq/L (ref 96–112)
GFR: 67.38 mL/min (ref >60.00)
Glucose, Bld: 101 mg/dL — ABNORMAL HIGH (ref 70–99)
Potassium: 3.6 mEq/L (ref 3.5–5.1)
SODIUM: 136 meq/L (ref 135–145)
TOTAL PROTEIN: 7.2 g/dL (ref 6.0–8.3)
Total Bilirubin: 0.5 mg/dL (ref 0.2–1.2)

## 2017-03-20 LAB — URINALYSIS, ROUTINE W REFLEX MICROSCOPIC
Bilirubin Urine: NEGATIVE
Hgb urine dipstick: NEGATIVE
Ketones, ur: NEGATIVE
Leukocytes, UA: NEGATIVE
Nitrite: NEGATIVE
PH: 6 (ref 5.0–8.0)
SPECIFIC GRAVITY, URINE: 1.015 (ref 1.000–1.030)
TOTAL PROTEIN, URINE-UPE24: NEGATIVE
URINE GLUCOSE: NEGATIVE
Urobilinogen, UA: 0.2 (ref 0.0–1.0)
WBC, UA: NONE SEEN (ref 0–?)

## 2017-03-20 LAB — PROTIME-INR
INR: 1.1 ratio — ABNORMAL HIGH (ref 0.8–1.0)
PROTHROMBIN TIME: 11.9 s (ref 9.6–13.1)

## 2017-03-20 LAB — APTT: APTT: 28.8 s (ref 23.4–32.7)

## 2017-03-20 NOTE — Progress Notes (Signed)
Subjective:  Patient ID: [name], male    DOB: 09-11-1950  Age: 66 y.o. MRN: [id number]  CC: Hematuria and Flank Pain  NEW TO ME  HPI [name] presents for the complaint of a two-week history of intermittent left flank and left upper quadrant pain and an episode of gross hematuria. He brings his underwear with them and he shows a stain of blood at the site where the tip of his penis was.  Outpatient Medications Prior to Visit  Medication Sig Dispense Refill  . diphenhydrAMINE (BENADRYL) 25 mg capsule Take 2 capsules (50 mg total) by mouth every 6 (six) hours as needed. 30 capsule 0  . pantoprazole (PROTONIX) 40 MG tablet TAKE 1 TABLET TWICE A DAY BEFORE MEALS 180 tablet 3  . ranitidine (ZANTAC) 300 MG tablet Take 1 tablet (300 mg total) by mouth at bedtime. 90 tablet 3  . doxycycline (VIBRAMYCIN) 100 MG capsule Take 1 capsule (100 mg total) by mouth 2 (two) times daily. 20 capsule 0  . predniSONE (STERAPRED UNI-PAK 21 TAB) 10 MG (21) TBPK tablet Take by mouth daily. Take 6 tabs by mouth daily  for 2 days, then 5 tabs for 2 days, then 4 tabs for 2 days, then 3 tabs for 2 days, 2 tabs for 2 days, then 1 tab by mouth daily for 2 days 42 tablet 0   No facility-administered medications prior to visit.     ROS Review of Systems  Constitutional: Negative.  Negative for appetite change, chills, diaphoresis, fatigue, fever and unexpected weight change.  HENT: Negative.  Negative for nosebleeds.   Eyes: Negative.  Negative for visual disturbance.  Respiratory: Negative for cough, chest tightness, shortness of breath and wheezing.   Cardiovascular: Negative.  Negative for chest pain, palpitations and leg swelling.  Gastrointestinal: Positive for abdominal pain. Negative for anal bleeding, blood in stool, constipation, diarrhea, nausea and vomiting.  Endocrine: Negative.   Genitourinary: Positive for flank pain and hematuria. Negative for decreased urine volume, difficulty  urinating, discharge, dysuria, frequency, penile pain, penile swelling, scrotal swelling, testicular pain and urgency.  Musculoskeletal: Negative for back pain.  Skin: Negative.  Negative for rash.  Allergic/Immunologic: Negative.   Neurological: Negative.  Negative for dizziness.  Hematological: Negative for adenopathy. Does not bruise/bleed easily.  Psychiatric/Behavioral: Negative.     Objective:  BP 132/80 (BP Location: Left Arm, Patient Position: Sitting, Cuff Size: Normal)   Pulse 69   Temp 98.5 F (36.9 C) (Oral)   Ht 5\' 9"  (1.753 m)   Wt 187 lb (84.8 kg)   SpO2 98%   BMI 27.62 kg/m   BP Readings from Last 3 Encounters:  03/20/17 132/80  12/10/16 127/86  06/13/16 120/80    Wt Readings from Last 3 Encounters:  03/20/17 187 lb (84.8 kg)  06/13/16 185 lb 8 oz (84.1 kg)  05/13/16 183 lb (83 kg)    Physical Exam  Constitutional: He is oriented to person, place, and time. No distress.  HENT:  Mouth/Throat: Oropharynx is clear and moist. No oropharyngeal exudate.  Eyes: Conjunctivae are normal. Right eye exhibits no discharge. Left eye exhibits no discharge. No scleral icterus.  Neck: Normal range of motion. Neck supple. No JVD present. No thyromegaly present.  Cardiovascular: Normal rate, regular rhythm and intact distal pulses.  Exam reveals no gallop and no friction rub.   No murmur heard. Pulmonary/Chest: Effort normal and breath sounds normal. No respiratory distress. He has no wheezes. He has no rales. He  exhibits no tenderness.  Abdominal: Soft. Bowel sounds are normal. He exhibits no distension and no mass. There is no tenderness. There is no rebound and no guarding. Hernia confirmed negative in the right inguinal area and confirmed negative in the left inguinal area.  Genitourinary: Rectum normal, prostate normal and penis normal. Rectal exam shows no external hemorrhoid, no internal hemorrhoid, no fissure, no mass, no tenderness, anal tone normal and guaiac  negative stool. Prostate is not enlarged and not tender. Right testis shows mass. Right testis shows no swelling and no tenderness. Right testis is descended. Left testis shows no mass, no swelling and no tenderness. Left testis is descended. Circumcised. No penile erythema or penile tenderness. No discharge found.  Genitourinary Comments: There is a spermatocele on the right side  Musculoskeletal: Normal range of motion. He exhibits no edema or tenderness.  Lymphadenopathy:    He has no cervical adenopathy.       Right: No inguinal adenopathy present.       Left: No inguinal adenopathy present.  Neurological: He is alert and oriented to person, place, and time.  Skin: He is not diaphoretic.  Vitals reviewed.   Lab Results  Component Value Date   WBC 7.7 03/20/2017   HGB 15.9 03/20/2017   HCT 47.3 03/20/2017   PLT 299.0 03/20/2017   GLUCOSE 101 (H) 03/20/2017   CHOL 162 06/13/2016   TRIG 124.0 06/13/2016   HDL 48.60 06/13/2016   LDLCALC 89 06/13/2016   ALT 20 03/20/2017   AST 17 03/20/2017   NA 136 03/20/2017   K 3.6 03/20/2017   CL 99 03/20/2017   CREATININE 1.16 03/20/2017   BUN 12 03/20/2017   CO2 29 03/20/2017   TSH 1.17 06/13/2016   PSA 0.49 06/13/2016   INR 1.1 (H) 03/20/2017   HGBA1C 4.9 10/08/2011    No results found.  Assessment & Plan:   [name] was seen today for hematuria and flank pain.  Diagnoses and all orders for this visit:  Acute left flank pain- as below -     CBC with Differential/Platelet; Future -     Comprehensive metabolic panel; Future -     DG Abd Acute W/Chest; Future -     CT RENAL ABD W/WO; Future  Hematuria, gross- his plain film is negative for stone or mass. His urinalysis is negative for red blood cells or white blood cells. His labs are negative for any concerns for renal insufficiency or coagulopathy. I've asked him to undergo a CT with and without contrast to see if there is a mass in his kidneys or bladder to explain the blood in  the urine. If the hematuria persists and this is unremarkable then will consider referring to urology for cystoscopy. -     Protime-INR; Future -     APTT; Future -     Urinalysis, Routine w reflex microscopic; Future -     CULTURE, URINE COMPREHENSIVE; Future -     DG Abd Acute W/Chest; Future -     CT RENAL ABD W/WO; Future  Need for influenza vaccination -     Flu Vaccine QUAD 36+ mos IM  Gross hematuria   I have discontinued Mr. Reinheimer predniSONE and doxycycline. I am also having him maintain his pantoprazole, ranitidine, and diphenhydrAMINE.  No orders of the defined types were placed in this encounter.    Follow-up: Return in about 1 week (around 03/27/2017).  Scarlette Calico, MD

## 2017-03-20 NOTE — Patient Instructions (Signed)

## 2017-03-22 LAB — CULTURE, URINE COMPREHENSIVE
MICRO NUMBER:: 81041627
RESULT: NO GROWTH
SPECIMEN QUALITY:: ADEQUATE

## 2017-03-24 ENCOUNTER — Telehealth: Payer: Self-pay

## 2017-03-24 NOTE — Telephone Encounter (Signed)
Patient has been added to shingrix waitlist 

## 2017-03-31 ENCOUNTER — Inpatient Hospital Stay: Admission: RE | Admit: 2017-03-31 | Payer: BLUE CROSS/BLUE SHIELD | Source: Ambulatory Visit

## 2017-04-04 ENCOUNTER — Ambulatory Visit (INDEPENDENT_AMBULATORY_CARE_PROVIDER_SITE_OTHER)
Admission: RE | Admit: 2017-04-04 | Discharge: 2017-04-04 | Disposition: A | Discharge status: Home / Self Care | Payer: BLUE CROSS/BLUE SHIELD | Source: Ambulatory Visit | Attending: Internal Medicine | Admitting: Internal Medicine

## 2017-04-04 DIAGNOSIS — R109 Unspecified abdominal pain: Secondary | ICD-10-CM

## 2017-04-04 DIAGNOSIS — R310 Gross hematuria: Secondary | ICD-10-CM

## 2017-04-04 DIAGNOSIS — K5730 Diverticulosis of large intestine without perforation or abscess without bleeding: Secondary | ICD-10-CM | POA: Diagnosis not present

## 2017-04-04 MED ORDER — IOPAMIDOL (ISOVUE-300) INJECTION 61%
100.0000 mL | Freq: Once | INTRAVENOUS | Status: AC | PRN
  Administered 2017-04-04: 100 mL via INTRAVENOUS

## 2017-04-07 ENCOUNTER — Other Ambulatory Visit: Payer: Self-pay | Admitting: Gastroenterology

## 2017-04-10 NOTE — Telephone Encounter (Signed)
Patient has scheduled appt for shingrix, both vaccines labeled and placed in [facility] refrigerator

## 2017-04-16 ENCOUNTER — Telehealth: Payer: Self-pay | Admitting: Internal Medicine

## 2017-04-16 NOTE — Telephone Encounter (Signed)
Patient called back.  Gave MD response on CT.  Patient had mentioned that he got the same results on the x ray done before.  Patient states he is not having issues right now but could you give him a call back with maybe what he can do to help prevent this from happening?

## 2017-04-18 ENCOUNTER — Ambulatory Visit: Payer: BLUE CROSS/BLUE SHIELD

## 2017-07-08 ENCOUNTER — Other Ambulatory Visit: Payer: Self-pay | Admitting: Gastroenterology

## 2017-07-11 DIAGNOSIS — H31003 Unspecified chorioretinal scars, bilateral: Secondary | ICD-10-CM | POA: Diagnosis not present

## 2017-07-11 DIAGNOSIS — H26491 Other secondary cataract, right eye: Secondary | ICD-10-CM | POA: Diagnosis not present

## 2017-07-11 DIAGNOSIS — H0100A Unspecified blepharitis right eye, upper and lower eyelids: Secondary | ICD-10-CM | POA: Diagnosis not present

## 2017-07-11 DIAGNOSIS — H43813 Vitreous degeneration, bilateral: Secondary | ICD-10-CM | POA: Diagnosis not present

## 2017-08-01 ENCOUNTER — Ambulatory Visit: Payer: BLUE CROSS/BLUE SHIELD | Admitting: Internal Medicine

## 2017-08-01 ENCOUNTER — Encounter: Payer: Self-pay | Admitting: Internal Medicine

## 2017-08-01 VITALS — BP 134/86 | HR 95 | Temp 97.6°F | Ht 69.0 in | Wt 198.0 lb

## 2017-08-01 DIAGNOSIS — J019 Acute sinusitis, unspecified: Secondary | ICD-10-CM

## 2017-08-01 MED ORDER — HYDROCODONE-HOMATROPINE 5-1.5 MG/5ML PO SYRP: 5.0000 mL | ORAL_SOLUTION | Freq: Four times a day (QID) | ORAL | 0 refills | Status: AC | PRN

## 2017-08-01 MED ORDER — AZITHROMYCIN 250 MG PO TABS: ORAL_TABLET | ORAL | 1 refills | Status: DC

## 2017-08-01 NOTE — Patient Instructions (Addendum)
Please take all new medication as prescribed - the antibiotic, and cough medicine as needed  You can also take Mucinex (or it's generic off brand) for congestion, and tylenol as needed for pain.  Please continue all other medications as before, and refills have been done if requested.  Please have the pharmacy call with any other refills you may need.  Please keep your appointments with your specialists as you may have planned   

## 2017-08-01 NOTE — Progress Notes (Signed)
   Subjective:    Patient ID: [name], male    DOB: Jan 05, 1951, 67 y.o.   MRN: [id number]  HPI   Here with 2-3 days acute onset fever, facial pain, pressure, headache, general weakness and malaise, and greenish d/c, with mild ST and cough, but pt denies chest pain, wheezing, increased sob or doe, orthopnea, PND, increased LE swelling, palpitations, dizziness or syncope.   Pt denies polydipsia, polyuria Past Medical History:  Diagnosis Date  . Barrett's esophagus 07/2012  . Diverticulosis   . Erosive esophagitis   . GERD (gastroesophageal reflux disease)   . GI bleed   . S/P laparoscopic cholecystectomy for gangrenous GB complicated by bile leak 06/07/2011  . Tubular adenoma of colon 07/2012   Past Surgical History:  Procedure Laterality Date  . CHOLECYSTECTOMY    . COLONOSCOPY    . ESOPHAGOGASTRODUODENOSCOPY  10/08/2011   Procedure: ESOPHAGOGASTRODUODENOSCOPY (EGD);  Surgeon: Ladene Artist, MD,FACG;  Location: [facility];  Service: Endoscopy;  Laterality: N/A;  . HERNIA REPAIR    . NISSEN FUNDOPLICATION      reports that  has never smoked. he has never used smokeless tobacco. He reports that he does not drink alcohol or use drugs. family history includes Alcohol abuse in his father; Breast cancer in his maternal aunt; Cancer in his mother; Drug abuse in his son; Early death (age of onset: 85) in his son; Esophageal cancer in his paternal uncle; Heart disease in his mother; Lung cancer in his mother; Pancreatitis in his father. No Known Allergies Current Outpatient Medications on File Prior to Visit  Medication Sig Dispense Refill  . diphenhydrAMINE (BENADRYL) 25 mg capsule Take 2 capsules (50 mg total) by mouth every 6 (six) hours as needed. 30 capsule 0  . pantoprazole (PROTONIX) 40 MG tablet TAKE 1 TABLET TWICE A DAY BEFORE MEALS 180 tablet 3  . ranitidine (ZANTAC) 300 MG tablet TAKE 1 TABLET AT BEDTIME (NEED APPOINTMENT FOR FURTHER REFILLS) 90 tablet 0   No current  facility-administered medications on file prior to visit.    Review of Systems All otherwise neg per pt    Objective:   Physical Exam BP 134/86   Pulse 95   Temp 97.6 F (36.4 C) (Oral)   Ht 5\' 9"  (1.753 m)   Wt 198 lb (89.8 kg)   SpO2 99%   BMI 29.24 kg/m  VS noted, mild ill Constitutional: Pt appears in NAD HENT: Head: NCAT.  Right Ear: External ear normal.  Left Ear: External ear normal.  Eyes: . Pupils are equal, round, and reactive to light. Conjunctivae and EOM are normal Bilat tm's with mild erythema.  Max sinus areas mild tender.  Pharynx with mild erythema, no exudate Nose: without d/c or deformity Neck: Neck supple. Gross normal ROM Cardiovascular: Normal rate and regular rhythm.   Pulmonary/Chest: Effort normal and breath sounds without rales or wheezing.  Neurological: Pt is alert. At baseline orientation, motor grossly intact Skin: Skin is warm. No rashes, other new lesions, no LE edema Psychiatric: Pt behavior is normal without agitation  No other exam findings    Assessment & Plan:

## 2017-08-02 DIAGNOSIS — J019 Acute sinusitis, unspecified: Secondary | ICD-10-CM | POA: Insufficient documentation

## 2017-08-02 NOTE — Assessment & Plan Note (Signed)
Mild to mod, for antibx course,  to f/u any worsening symptoms or concerns 

## 2017-10-07 ENCOUNTER — Other Ambulatory Visit: Payer: Self-pay | Admitting: Gastroenterology

## 2017-10-24 DIAGNOSIS — H6691 Otitis media, unspecified, right ear: Secondary | ICD-10-CM | POA: Diagnosis not present

## 2017-10-24 DIAGNOSIS — H6521 Chronic serous otitis media, right ear: Secondary | ICD-10-CM | POA: Diagnosis not present

## 2017-10-24 DIAGNOSIS — H6062 Unspecified chronic otitis externa, left ear: Secondary | ICD-10-CM | POA: Diagnosis not present

## 2017-10-24 DIAGNOSIS — H6122 Impacted cerumen, left ear: Secondary | ICD-10-CM | POA: Diagnosis not present

## 2017-11-07 DIAGNOSIS — J320 Chronic maxillary sinusitis: Secondary | ICD-10-CM | POA: Diagnosis not present

## 2017-11-07 DIAGNOSIS — H6523 Chronic serous otitis media, bilateral: Secondary | ICD-10-CM | POA: Diagnosis not present

## 2017-11-07 DIAGNOSIS — J322 Chronic ethmoidal sinusitis: Secondary | ICD-10-CM | POA: Diagnosis not present

## 2017-11-12 ENCOUNTER — Encounter: Payer: Self-pay | Admitting: Gastroenterology

## 2017-11-12 ENCOUNTER — Ambulatory Visit: Payer: BLUE CROSS/BLUE SHIELD | Admitting: Gastroenterology

## 2017-11-12 VITALS — BP 120/88 | HR 84 | Ht 69.0 in | Wt 198.8 lb

## 2017-11-12 DIAGNOSIS — K2270 Barrett's esophagus without dysplasia: Secondary | ICD-10-CM

## 2017-11-12 DIAGNOSIS — Z86010 Personal history of colonic polyps: Secondary | ICD-10-CM

## 2017-11-12 MED ORDER — RANITIDINE HCL 300 MG PO TABS: ORAL_TABLET | ORAL | 3 refills | Status: DC

## 2017-11-12 MED ORDER — NA SULFATE-K SULFATE-MG SULF 17.5-3.13-1.6 GM/177ML PO SOLN: 1.0000 | Freq: Once | ORAL | 0 refills | Status: AC

## 2017-11-12 MED ORDER — PANTOPRAZOLE SODIUM 40 MG PO TBEC: DELAYED_RELEASE_TABLET | ORAL | 3 refills | Status: DC

## 2017-11-12 NOTE — Patient Instructions (Signed)
We have sent the following prescriptions to your mail in pharmacy:pantoprazole and ranitidine.  If you have not heard from your mail in pharmacy within 1 week or if you have not received your medication in the mail, please contact us at [phone] so we may find out why.   You have been scheduled for a colonoscopy. Please follow written instructions given to you at your visit today.  Please pick up your prep supplies at the pharmacy within the next 1-3 days. If you use inhalers (even only as needed), please bring them with you on the day of your procedure. Your physician has requested that you go to www.startemmi.com and enter the access code given to you at your visit today. This web site gives a general overview about your procedure. However, you should still follow specific instructions given to you by our office regarding your preparation for the procedure.  Normal BMI (Body Mass Index- based on height and weight) is between 19 and 25. Your BMI today is Body mass index is 29.36 kg/m. Marland Kitchen Please consider follow up  regarding your BMI with your Primary Care Provider.  Thank you for choosing me and [facility].  Pricilla Riffle. Dagoberto Ligas., MD., Marval Regal

## 2017-11-12 NOTE — Progress Notes (Signed)
    History of Present Illness: This is a 67 year old male with Barrett's esophagus without dysplasia.  His reflux symptoms remain under very good control on his current regimen.  He is due for surveillance colonoscopy for a personal history of adenomatous colon polyps. Denies weight loss, abdominal pain, constipation, diarrhea, change in stool caliber, melena, hematochezia, nausea, vomiting, dysphagia, chest pain.  Current Medications, Allergies, Past Medical History, Past Surgical History, Family History and Social History were reviewed in [employer] record.  Physical Exam: General: Well developed, well nourished, no acute distress Head: Normocephalic and atraumatic Eyes:  sclerae anicteric, EOMI Ears: Normal auditory acuity Mouth: No deformity or lesions Lungs: Clear throughout to auscultation Heart: Regular rate and rhythm; no murmurs, rubs or bruits Abdomen: Soft, non tender and non distended. No masses, hepatosplenomegaly or hernias noted. Normal Bowel sounds Rectal: Deferred to colonoscopy Musculoskeletal: Symmetrical with no gross deformities  Pulses:  Normal pulses noted Extremities: No clubbing, cyanosis, edema or deformities noted Neurological: Alert oriented x 4, grossly nonfocal Psychological:  Alert and cooperative. Normal mood and affect  Assessment and Recommendations:  1.  Barrett's esophagus without dysplasia.  A surveillance EGD is recommended in November 2020.  Continue pantoprazole 40 mg twice daily ac and ranitidine 300 mg hs. Follow standard antireflux measures.  2.  Personal history of adenomatous colon polyps.  A 5-year interval surveillance colonoscopy is due. Schedule colonoscopy. The risks (including bleeding, perforation, infection, missed lesions, medication reactions and possible hospitalization or surgery if complications occur), benefits, and alternatives to colonoscopy with possible biopsy and possible polypectomy were discussed with  the patient and they consent to proceed.

## 2018-01-06 ENCOUNTER — Ambulatory Visit (AMBULATORY_SURGERY_CENTER): Payer: BLUE CROSS/BLUE SHIELD | Admitting: Gastroenterology

## 2018-01-06 ENCOUNTER — Encounter: Payer: Self-pay | Admitting: Gastroenterology

## 2018-01-06 VITALS — BP 118/82 | HR 69 | Temp 98.9°F | Resp 17 | Ht 69.0 in | Wt 198.8 lb

## 2018-01-06 DIAGNOSIS — Z86010 Personal history of colonic polyps: Secondary | ICD-10-CM | POA: Diagnosis present

## 2018-01-06 DIAGNOSIS — D122 Benign neoplasm of ascending colon: Secondary | ICD-10-CM

## 2018-01-06 DIAGNOSIS — K621 Rectal polyp: Secondary | ICD-10-CM | POA: Diagnosis not present

## 2018-01-06 DIAGNOSIS — Z1211 Encounter for screening for malignant neoplasm of colon: Secondary | ICD-10-CM | POA: Diagnosis not present

## 2018-01-06 LAB — HM COLONOSCOPY

## 2018-01-06 MED ORDER — SODIUM CHLORIDE 0.9 % IV SOLN: 500.0000 mL | Freq: Once | INTRAVENOUS | Status: DC

## 2018-01-06 NOTE — Progress Notes (Signed)
Called to room to assist during endoscopic procedure.  Patient ID and intended procedure confirmed with present staff. Received instructions for my participation in the procedure from the performing physician.  

## 2018-01-06 NOTE — Progress Notes (Signed)
Reviewed patient history.

## 2018-01-06 NOTE — Progress Notes (Signed)
A and O x3. Report to RN. Tolerated MAC anesthesia well.

## 2018-01-06 NOTE — Patient Instructions (Signed)
Handouts given:  Diverticulosis and Polyps  YOU HAD AN ENDOSCOPIC PROCEDURE TODAY AT [facility]:   Refer to the procedure report that was given to you for any specific questions about what was found during the examination.  If the procedure report does not answer your questions, please call your gastroenterologist to clarify.  If you requested that your care partner not be given the details of your procedure findings, then the procedure report has been included in a sealed envelope for you to review at your convenience later.  YOU SHOULD EXPECT: Some feelings of bloating in the abdomen. Passage of more gas than usual.  Walking can help get rid of the air that was put into your GI tract during the procedure and reduce the bloating. If you had a lower endoscopy (such as a colonoscopy or flexible sigmoidoscopy) you may notice spotting of blood in your stool or on the toilet paper. If you underwent a bowel prep for your procedure, you may not have a normal bowel movement for a few days.  Please Note:  You might notice some irritation and congestion in your nose or some drainage.  This is from the oxygen used during your procedure.  There is no need for concern and it should clear up in a day or so.  SYMPTOMS TO REPORT IMMEDIATELY:   Following lower endoscopy (colonoscopy or flexible sigmoidoscopy):  Excessive amounts of blood in the stool  Significant tenderness or worsening of abdominal pains  Swelling of the abdomen that is new, acute  Fever of 100F or higher   For urgent or emergent issues, a gastroenterologist can be reached at any hour by calling [phone].   DIET:  We do recommend a small meal at first, but then you may proceed to your regular diet.  Drink plenty of fluids but you should avoid alcoholic beverages for 24 hours.  ACTIVITY:  You should plan to take it easy for the rest of today and you should NOT DRIVE or use heavy machinery until tomorrow (because of  the sedation medicines used during the test).    FOLLOW UP: Our staff will call the number listed on your records the next business day following your procedure to check on you and address any questions or concerns that you may have regarding the information given to you following your procedure. If we do not reach you, we will leave a message.  However, if you are feeling well and you are not experiencing any problems, there is no need to return our call.  We will assume that you have returned to your regular daily activities without incident.  If any biopsies were taken you will be contacted by phone or by letter within the next 1-3 weeks.  Please call us at [phone] if you have not heard about the biopsies in 3 weeks.    SIGNATURES/CONFIDENTIALITY: You and/or your care partner have signed paperwork which will be entered into your electronic medical record.  These signatures attest to the fact that that the information above on your After Visit Summary has been reviewed and is understood.  Full responsibility of the confidentiality of this discharge information lies with you and/or your care-partner.

## 2018-01-06 NOTE — Op Note (Signed)
[place] Patient Name: Fawzi Melman Procedure Date: 01/06/2018 2:19 PM MRN: [id number] Endoscopist: Ladene Artist , MD Age: 67 Referring MD:  Date of Birth: May 28, 1951 Gender: Male Account #: [id number] Procedure:                Colonoscopy Indications:              Surveillance: Personal history of adenomatous                            polyps on last colonoscopy 5 years ago Medicines:                Monitored Anesthesia Care Procedure:                Pre-Anesthesia Assessment:                           - Prior to the procedure, a History and Physical                            was performed, and patient medications and                            allergies were reviewed. The patient's tolerance of                            previous anesthesia was also reviewed. The risks                            and benefits of the procedure and the sedation                            options and risks were discussed with the patient.                            All questions were answered, and informed consent                            was obtained. Prior Anticoagulants: The patient has                            taken no previous anticoagulant or antiplatelet                            agents. ASA Grade Assessment: II - A patient with                            mild systemic disease. After reviewing the risks                            and benefits, the patient was deemed in                            satisfactory condition to undergo the procedure.  After obtaining informed consent, the colonoscope                            was passed under direct vision. Throughout the                            procedure, the patient's blood pressure, pulse, and                            oxygen saturations were monitored continuously. The                            Colonoscope was introduced through the anus and                            advanced to the the cecum,  identified by                            appendiceal orifice and ileocecal valve. The                            ileocecal valve, appendiceal orifice, and rectum                            were photographed. The quality of the bowel                            preparation was adequate. The colonoscopy was                            performed without difficulty. The patient tolerated                            the procedure well. Scope In: 2:27:45 PM Scope Out: 2:43:03 PM Scope Withdrawal Time: 0 hours 11 minutes 11 seconds  Total Procedure Duration: 0 hours 15 minutes 18 seconds  Findings:                 The perianal and digital rectal examinations were                            normal.                           A 6 mm polyp was found in the rectum. The polyp was                            sessile. The polyp was removed with a cold snare.                            Resection and retrieval were complete.                           Multiple medium-mouthed diverticula were found in  the right colon. There was no evidence of                            diverticular bleeding.                           Multiple medium-mouthed diverticula were found in                            the left colon. There was evidence of diverticular                            spasm. Peri-diverticular erythema was seen. There                            was evidence of an impacted diverticulum. There was                            no evidence of diverticular bleeding.                           The exam was otherwise without abnormality on                            direct and retroflexion views. Complications:            No immediate complications. Estimated blood loss:                            None. Estimated Blood Loss:     Estimated blood loss: none. Impression:               - One 6 mm polyp in the rectum, removed with a cold                            snare. Resected and retrieved.                            - Moderate diverticulosis in the right colon. There                            was no evidence of diverticular bleeding.                           - Severe diverticulosis in the left colon. There                            was evidence of diverticular spasm.                            Peri-diverticular erythema was seen. There was                            evidence of an impacted diverticulum. There was no  evidence of diverticular bleeding.                           - The examination was otherwise normal on direct                            and retroflexion views. Recommendation:           - Repeat colonoscopy in 5 years for surveillance                            with a more extensive bowel prep.                           - Patient has a contact number available for                            emergencies. The signs and symptoms of potential                            delayed complications were discussed with the                            patient. Return to normal activities tomorrow.                            Written discharge instructions were provided to the                            patient.                           - High fiber diet.                           - Continue present medications.                           - Await pathology results. Ladene Artist, MD 01/06/2018 2:46:45 PM This report has been signed electronically.

## 2018-01-07 ENCOUNTER — Telehealth: Payer: Self-pay

## 2018-01-07 NOTE — Telephone Encounter (Signed)
Left message

## 2018-01-13 ENCOUNTER — Encounter: Payer: Self-pay | Admitting: Gastroenterology

## 2018-01-14 LAB — HM COLONOSCOPY

## 2018-03-13 DIAGNOSIS — H6502 Acute serous otitis media, left ear: Secondary | ICD-10-CM | POA: Diagnosis not present

## 2018-03-13 DIAGNOSIS — J320 Chronic maxillary sinusitis: Secondary | ICD-10-CM | POA: Diagnosis not present

## 2018-03-13 DIAGNOSIS — H6062 Unspecified chronic otitis externa, left ear: Secondary | ICD-10-CM | POA: Diagnosis not present

## 2018-03-13 DIAGNOSIS — H6122 Impacted cerumen, left ear: Secondary | ICD-10-CM | POA: Diagnosis not present

## 2018-04-17 ENCOUNTER — Ambulatory Visit (INDEPENDENT_AMBULATORY_CARE_PROVIDER_SITE_OTHER): Payer: BLUE CROSS/BLUE SHIELD

## 2018-04-17 DIAGNOSIS — Z23 Encounter for immunization: Secondary | ICD-10-CM

## 2018-05-07 ENCOUNTER — Encounter: Payer: Self-pay | Admitting: Internal Medicine

## 2018-05-07 ENCOUNTER — Other Ambulatory Visit (INDEPENDENT_AMBULATORY_CARE_PROVIDER_SITE_OTHER): Payer: BLUE CROSS/BLUE SHIELD

## 2018-05-07 ENCOUNTER — Ambulatory Visit (INDEPENDENT_AMBULATORY_CARE_PROVIDER_SITE_OTHER): Payer: BLUE CROSS/BLUE SHIELD | Admitting: Internal Medicine

## 2018-05-07 VITALS — BP 116/82 | HR 90 | Temp 98.5°F | Resp 16 | Ht 69.0 in | Wt 201.0 lb

## 2018-05-07 DIAGNOSIS — E785 Hyperlipidemia, unspecified: Secondary | ICD-10-CM | POA: Diagnosis not present

## 2018-05-07 DIAGNOSIS — Z0000 Encounter for general adult medical examination without abnormal findings: Secondary | ICD-10-CM | POA: Diagnosis not present

## 2018-05-07 DIAGNOSIS — K219 Gastro-esophageal reflux disease without esophagitis: Secondary | ICD-10-CM

## 2018-05-07 DIAGNOSIS — R310 Gross hematuria: Secondary | ICD-10-CM

## 2018-05-07 DIAGNOSIS — Z23 Encounter for immunization: Secondary | ICD-10-CM | POA: Diagnosis not present

## 2018-05-07 LAB — CBC WITH DIFFERENTIAL/PLATELET
BASOS ABS: 0.1 10*3/uL (ref 0.0–0.1)
Basophils Relative: 0.7 % (ref 0.0–3.0)
Eosinophils Absolute: 0.1 10*3/uL (ref 0.0–0.7)
Eosinophils Relative: 1.1 % (ref 0.0–5.0)
HEMATOCRIT: 49.8 % (ref 39.0–52.0)
HEMOGLOBIN: 17.1 g/dL — AB (ref 13.0–17.0)
LYMPHS PCT: 24.7 % (ref 12.0–46.0)
Lymphs Abs: 2.1 10*3/uL (ref 0.7–4.0)
MCHC: 34.3 g/dL (ref 30.0–36.0)
MCV: 94.7 fl (ref 78.0–100.0)
MONOS PCT: 10.2 % (ref 3.0–12.0)
Monocytes Absolute: 0.9 10*3/uL (ref 0.1–1.0)
Neutro Abs: 5.4 10*3/uL (ref 1.4–7.7)
Neutrophils Relative %: 63.3 % (ref 43.0–77.0)
Platelets: 276 10*3/uL (ref 150.0–400.0)
RBC: 5.25 Mil/uL (ref 4.22–5.81)
RDW: 13 % (ref 11.5–15.5)
WBC: 8.6 10*3/uL (ref 4.0–10.5)

## 2018-05-07 LAB — URINALYSIS, ROUTINE W REFLEX MICROSCOPIC
Bilirubin Urine: NEGATIVE
Hgb urine dipstick: NEGATIVE
Ketones, ur: NEGATIVE
Leukocytes, UA: NEGATIVE
NITRITE: NEGATIVE
RBC / HPF: NONE SEEN (ref 0–?)
SPECIFIC GRAVITY, URINE: 1.01 (ref 1.000–1.030)
Total Protein, Urine: NEGATIVE
Urine Glucose: NEGATIVE
Urobilinogen, UA: 0.2 (ref 0.0–1.0)
pH: 6 (ref 5.0–8.0)

## 2018-05-07 LAB — LIPID PANEL
CHOL/HDL RATIO: 4
Cholesterol: 176 mg/dL (ref 0–200)
HDL: 43.1 mg/dL (ref >39.00)
LDL Cholesterol: 93 mg/dL (ref 0–99)
NONHDL: 132.48
TRIGLYCERIDES: 196 mg/dL — AB (ref 0.0–149.0)
VLDL: 39.2 mg/dL (ref 0.0–40.0)

## 2018-05-07 LAB — COMPREHENSIVE METABOLIC PANEL
ALT: 16 U/L (ref 0–53)
AST: 17 U/L (ref 0–37)
Albumin: 4.7 g/dL (ref 3.5–5.2)
Alkaline Phosphatase: 93 U/L (ref 39–117)
BUN: 18 mg/dL (ref 6–23)
CALCIUM: 9.5 mg/dL (ref 8.4–10.5)
CO2: 26 meq/L (ref 19–32)
CREATININE: 1.23 mg/dL (ref 0.40–1.50)
Chloride: 102 mEq/L (ref 96–112)
GFR: 62.75 mL/min (ref >60.00)
GLUCOSE: 91 mg/dL (ref 70–99)
Potassium: 4.3 mEq/L (ref 3.5–5.1)
Sodium: 137 mEq/L (ref 135–145)
Total Bilirubin: 0.5 mg/dL (ref 0.2–1.2)
Total Protein: 7.2 g/dL (ref 6.0–8.3)

## 2018-05-07 LAB — TSH: TSH: 1.61 u[IU]/mL (ref 0.35–4.50)

## 2018-05-07 LAB — PSA: PSA: 0.54 ng/mL (ref 0.10–4.00)

## 2018-05-07 MED ORDER — ZOSTER VAC RECOMB ADJUVANTED 50 MCG/0.5ML IM SUSR: 0.5000 mL | Freq: Once | INTRAMUSCULAR | 1 refills | Status: DC

## 2018-05-07 NOTE — Patient Instructions (Signed)

## 2018-05-07 NOTE — Progress Notes (Signed)
Subjective:  Patient ID: [name], male    DOB: [birth year]  Age: 67 y.o. MRN: [id number]  CC: Annual Exam   HPI [name] presents for a CPX.  Feels well and offers no complaints.  Outpatient Medications Prior to Visit  Medication Sig Dispense Refill  . pantoprazole (PROTONIX) 40 MG tablet Take 40 mg by mouth daily.    . ranitidine (ZANTAC) 300 MG tablet TAKE 1 TABLET AT BEDTIME 90 tablet 3  . pantoprazole (PROTONIX) 40 MG tablet TAKE 1 TABLET TWICE A DAY BEFORE MEALS 180 tablet 3  . 0.9 %  sodium chloride infusion      No facility-administered medications prior to visit.     ROS Review of Systems  All other systems reviewed and are negative.   Objective:  BP 116/82 (BP Location: Left Arm, Patient Position: Sitting, Cuff Size: Normal)   Pulse 90   Temp 98.5 F (36.9 C) (Oral)   Resp 16   Ht 5\' 9"  (1.753 m)   Wt 201 lb (91.2 kg)   SpO2 96%   BMI 29.68 kg/m   BP Readings from Last 3 Encounters:  05/07/18 116/82  01/06/18 118/82  11/12/17 120/88    Wt Readings from Last 3 Encounters:  05/07/18 201 lb (91.2 kg)  01/06/18 198 lb 12.8 oz (90.2 kg)  11/12/17 198 lb 12.8 oz (90.2 kg)    Physical Exam  Constitutional: He is oriented to person, place, and time. No distress.  HENT:  Mouth/Throat: Oropharynx is clear and moist. No oropharyngeal exudate.  Eyes: Conjunctivae are normal. No scleral icterus.  Neck: Normal range of motion. Neck supple. No JVD present. No thyromegaly present.  Cardiovascular: Normal rate, regular rhythm and normal heart sounds. Exam reveals no gallop.  No murmur heard. Pulmonary/Chest: Effort normal and breath sounds normal. No respiratory distress. He has no wheezes. He has no rales.  Abdominal: Soft. Bowel sounds are normal. He exhibits no mass. There is no hepatosplenomegaly. There is no tenderness. Hernia confirmed negative in the right inguinal area and confirmed negative in the left inguinal area.  Genitourinary:  Rectum normal, prostate normal, testes normal and penis normal. Rectal exam shows no external hemorrhoid, no internal hemorrhoid, no fissure, no mass, no tenderness, anal tone normal and guaiac negative stool. Prostate is not enlarged and not tender. Right testis shows no mass, no swelling and no tenderness. Left testis shows no mass, no swelling and no tenderness. No penile erythema or penile tenderness. No discharge found.  Musculoskeletal: Normal range of motion. He exhibits no edema, tenderness or deformity.  Lymphadenopathy:    He has no cervical adenopathy. No inguinal adenopathy noted on the right or left side.  Neurological: He is alert and oriented to person, place, and time.  Skin: Skin is warm and dry. No rash noted. He is not diaphoretic.  Psychiatric: He has a normal mood and affect. His behavior is normal. Judgment and thought content normal.  Vitals reviewed.   Lab Results  Component Value Date   WBC 8.6 05/07/2018   HGB 17.1 (H) 05/07/2018   HCT 49.8 05/07/2018   PLT 276.0 05/07/2018   GLUCOSE 91 05/07/2018   CHOL 176 05/07/2018   TRIG 196.0 (H) 05/07/2018   HDL 43.10 05/07/2018   LDLCALC 93 05/07/2018   ALT 16 05/07/2018   AST 17 05/07/2018   NA 137 05/07/2018   K 4.3 05/07/2018   CL 102 05/07/2018   CREATININE 1.23 05/07/2018   BUN 18 05/07/2018  CO2 26 05/07/2018   TSH 1.61 05/07/2018   PSA 0.54 05/07/2018   INR 1.1 (H) 03/20/2017   HGBA1C 4.9 10/08/2011    Ct Abdomen Pelvis W Wo Contrast  Result Date: 04/04/2017 CLINICAL DATA:  Two week history of intermittent left flank pain and left upper quadrant abdominal pain. Episode of gross hematuria. EXAM: CT ABDOMEN AND PELVIS WITHOUT AND WITH CONTRAST TECHNIQUE: Multidetector CT imaging of the abdomen and pelvis was performed following the standard protocol before and following the bolus administration of intravenous contrast. CONTRAST:  144mL ISOVUE-300 IOPAMIDOL (ISOVUE-300) INJECTION 61% COMPARISON:  CT scan  10/04/2011 FINDINGS: Lower chest: The lung bases are clear of acute process. No pleural effusion or pulmonary lesions. The heart is normal in size. No pericardial effusion. There is a slipped Nissen fundoplication noted. Hepatobiliary: No focal hepatic lesions or intrahepatic biliary dilatation. The gallbladder is surgically absent. No common bile duct dilatation. Pancreas: No mass, inflammation or ductal dilatation. Spleen: Normal size.  No focal lesions. Adrenals/Urinary Tract: The adrenal glands and kidneys are normal. No renal, ureteral or bladder calculi or mass. Both kidneys demonstrate normal perfusion/ enhancement. Tiny lower pole left renal cyst. The delayed images do not demonstrate any significant collecting system abnormalities. Both the both ureters are normal. The bladder is normal. Stomach/Bowel: The stomach, duodenum, small bowel and colon are grossly normal without oral contrast. No inflammatory changes, mass lesions or obstructive findings. The terminal ileum and appendix are normal. Moderate stool throughout the colon suggesting constipation. There is severe sigmoid diverticulosis without definite findings for acute diverticulitis. Vascular/Lymphatic: The aorta is normal in caliber. No dissection. The branch vessels are patent. The major venous structures are patent. No mesenteric or retroperitoneal mass or adenopathy. Small scattered lymph nodes are noted. Reproductive: The prostate gland and seminal vesicles are unremarkable. Other: Small right inguinal hernia containing fat. Small periumbilical abdominal wall hernia containing fat. Musculoskeletal: No significant bony findings. Moderate degenerative changes involving the lower thoracic spine. IMPRESSION: 1. Slipped Nissen fundoplication. 2. No CT findings to account for the patient's hematuria. No renal, ureteral or bladder calculi or mass. 3. Significant diverticulosis involving the sigmoid colon but no evidence of acute diverticulitis. 4.  Moderate stool throughout the colon suggesting constipation. 5. Status post cholecystectomy but no biliary dilatation. Electronically Signed   By: Marijo Sanes M.D.   On: 04/04/2017 15:11    Assessment & Plan:   [name] was seen today for annual exam.  Diagnoses and all orders for this visit:  Need for shingles vaccine -     Discontinue: Zoster Vaccine Adjuvanted [facility]) injection; Inject 0.5 mLs into the muscle once for 1 dose. -     Varicella-zoster vaccine IM (Shingrix)  Hematuria, gross- This has resolved -     Urinalysis, Routine w reflex microscopic; Future  Routine general medical examination at a health care facility- Exam completed, labs reviewed, vaccines reviewed and updated, screening for colon cancer is up-to-date, patient education material was given. -     Lipid panel; Future -     PSA; Future -     Cancel: Urinalysis, Routine w reflex microscopic; Future  Gastroesophageal reflux disease without esophagitis- His symptoms are well controlled.  His only complication is Barrett's esophagus. -     CBC with Differential/Platelet; Future  Hyperlipidemia LDL goal <130- His ASCVD risk score is less than 15% so I do not recommend a statin for CV risk reduction. -     Comprehensive metabolic panel; Future -  TSH; Future   I have discontinued [name]'s Zoster Vaccine Adjuvanted. I am also having him maintain his ranitidine and pantoprazole. We will stop administering sodium chloride.  Meds ordered this encounter  Medications  . DISCONTD: Zoster Vaccine Adjuvanted [facility]) injection    Sig: Inject 0.5 mLs into the muscle once for 1 dose.    Dispense:  0.5 mL    Refill:  1     Follow-up: Return if symptoms worsen or fail to improve.  Scarlette Calico, MD

## 2018-05-18 ENCOUNTER — Telehealth: Payer: Self-pay | Admitting: Gastroenterology

## 2018-05-18 MED ORDER — FAMOTIDINE 40 MG PO TABS: 40.0000 mg | ORAL_TABLET | Freq: Every day | ORAL | 1 refills | Status: DC

## 2018-05-18 NOTE — Telephone Encounter (Signed)
Left a message for patient to return my call. 

## 2018-05-18 NOTE — Telephone Encounter (Signed)
Informed patient Dr. Fuller Plan is switching all of his patients from ranitidine to famotidine. Patient states he would like a temporary supply sent to [facility] to see if it works the same as ranitidine. Prescription sent to patient's pharmacy.

## 2018-06-01 ENCOUNTER — Other Ambulatory Visit: Payer: Self-pay

## 2018-06-01 MED ORDER — FAMOTIDINE 40 MG PO TABS: 40.0000 mg | ORAL_TABLET | Freq: Every day | ORAL | 1 refills | Status: DC

## 2018-08-10 ENCOUNTER — Telehealth: Payer: Self-pay | Admitting: Gastroenterology

## 2018-08-10 NOTE — Telephone Encounter (Signed)
Deep chest pain since yesterday, he does not think that it is related to his heart. He would like a call back.

## 2018-08-10 NOTE — Telephone Encounter (Signed)
Left message for patient to call back  

## 2018-08-10 NOTE — Telephone Encounter (Signed)
Patient with intermittent chest pain over the last weeks.  " Similar to when I had gallbladder attack.  He will come in on 08/13/18 10:45 to see Dr. Fuller Plan  .

## 2018-08-13 ENCOUNTER — Encounter (INDEPENDENT_AMBULATORY_CARE_PROVIDER_SITE_OTHER): Payer: Self-pay

## 2018-08-13 ENCOUNTER — Encounter: Payer: Self-pay | Admitting: Gastroenterology

## 2018-08-13 ENCOUNTER — Ambulatory Visit: Payer: BLUE CROSS/BLUE SHIELD | Admitting: Gastroenterology

## 2018-08-13 ENCOUNTER — Other Ambulatory Visit (INDEPENDENT_AMBULATORY_CARE_PROVIDER_SITE_OTHER): Payer: BLUE CROSS/BLUE SHIELD

## 2018-08-13 VITALS — BP 126/84 | HR 72 | Ht 70.0 in | Wt 198.5 lb

## 2018-08-13 DIAGNOSIS — R6884 Jaw pain: Secondary | ICD-10-CM

## 2018-08-13 DIAGNOSIS — K2270 Barrett's esophagus without dysplasia: Secondary | ICD-10-CM

## 2018-08-13 DIAGNOSIS — R1013 Epigastric pain: Secondary | ICD-10-CM

## 2018-08-13 DIAGNOSIS — M546 Pain in thoracic spine: Secondary | ICD-10-CM | POA: Diagnosis not present

## 2018-08-13 LAB — HEPATIC FUNCTION PANEL
ALBUMIN: 4.3 g/dL (ref 3.5–5.2)
ALT: 19 U/L (ref 0–53)
AST: 16 U/L (ref 0–37)
Alkaline Phosphatase: 96 U/L (ref 39–117)
Bilirubin, Direct: 0.1 mg/dL (ref 0.0–0.3)
TOTAL PROTEIN: 7 g/dL (ref 6.0–8.3)
Total Bilirubin: 0.5 mg/dL (ref 0.2–1.2)

## 2018-08-13 LAB — AMYLASE: Amylase: 48 U/L (ref 27–131)

## 2018-08-13 LAB — LIPASE: LIPASE: 40 U/L (ref 11.0–59.0)

## 2018-08-13 NOTE — Progress Notes (Signed)
    History of Present Illness: This is a 68 year old male with intermittent back pain, epigastric pain and jaw pain.  He relates many years of episodic midthoracic back pain that radiates to the epigastrium and is associated with lower jaw pain.  The symptoms are mild but bothersome and he states they frequently resolve when he flosses his lower teeth.  The symptoms are not associated with meals or bowel movements.  The symptoms occur maybe 2-3 times per month and his last episode was 3 days ago.  LFTs were normal in November.  His reflux symptoms are under very good control although occasionally has some mild breakthrough.  He clearly distinguishes his reflux symptoms from his intermittent back pain, epigastric pain and jaw pain.    Review of image reports in Epic:  MRs of the thoracic spine and lumbar spine performed in May 2007.  Abdominal pelvic CT 03/2017 IMPRESSION: 1. Slipped Nissen fundoplication. 2. No CT findings to account for the patient's hematuria. No renal, ureteral or bladder calculi or mass. 3. Significant diverticulosis involving the sigmoid colon but no evidence of acute diverticulitis. 4. Moderate stool throughout the colon suggesting constipation. 5. Status post cholecystectomy but no biliary dilatation.  Current Medications, Allergies, Past Medical History, Past Surgical History, Family History and Social History were reviewed in [employer] record.  Physical Exam: General: Well developed, well nourished, no acute distress Head: Normocephalic and atraumatic Eyes:  sclerae anicteric, EOMI Ears: Normal auditory acuity Mouth: No deformity or lesions Lungs: Clear throughout to auscultation Heart: Regular rate and rhythm; no murmurs, rubs or bruits Abdomen: Soft, non tender and non distended. No masses, hepatosplenomegaly or hernias noted. Normal Bowel sounds Rectal: not done Musculoskeletal: Symmetrical with no gross deformities  Pulses:  Normal  pulses noted Extremities: No clubbing, cyanosis, edema or deformities noted Neurological: Alert oriented x 4, grossly nonfocal Psychological:  Alert and cooperative. Normal mood and affect   Assessment and Recommendations:  1.  Episodic thoracic back pain, epigastric pain and lower jaw pain.  His symptoms do not appear to be gastrointestinal in etiology.  No hepatobiliary abnormalities noted on CT scan in October 2018.  R/O remote possibility of biliary pain. LFTs, amylase and lipase were checked today and were normal.  No further GI evaluation recommended at this time.  Recommended to follow-up with PCP for further evaluation.  2. Barrett's esophagus without dysplasia.  Slipped Nissen fundoplication.  Continue pantoprazole 40 mg twice daily before meals and famotidine 40 mg at bedtime.  Continue to follow antireflux measures.  Surveillance endoscopy due in November 2020.  3. Status post cholecystectomy in 5366 complicated by a bile leak requiring ERCP with stent placement.

## 2018-08-13 NOTE — Patient Instructions (Addendum)
Your provider has requested that you go to the basement level for lab work before leaving today. Press "B" on the elevator. The lab is located at the first door on the left as you exit the elevator.  You will be due for a recall Upper Endoscopy in 05/2019. We will send you a reminder in the mail when it gets closer to that time.  Follow up with your primary care physician.   Thank you for choosing me and [facility].  Pricilla Riffle. Dagoberto Ligas., MD., Marval Regal

## 2018-09-03 ENCOUNTER — Ambulatory Visit (INDEPENDENT_AMBULATORY_CARE_PROVIDER_SITE_OTHER): Payer: BLUE CROSS/BLUE SHIELD

## 2018-09-03 DIAGNOSIS — Z299 Encounter for prophylactic measures, unspecified: Secondary | ICD-10-CM

## 2018-09-03 DIAGNOSIS — Z23 Encounter for immunization: Secondary | ICD-10-CM

## 2018-09-04 ENCOUNTER — Ambulatory Visit: Payer: Self-pay

## 2018-09-04 ENCOUNTER — Ambulatory Visit: Payer: BLUE CROSS/BLUE SHIELD

## 2018-09-04 NOTE — Telephone Encounter (Signed)
Incoming  Call from Patient stating  That  He received 2nd  Shingles injection yesterday .  Patient is experiencing flu like Sx.  Such as fatigue, chills , cough headache.  Had the same Sx with  First  Injection.  Patient is inquiring if he would be contagious with  His symptoms.  Informed patient that it certainly could be a possibility.  Patient  Voiced understanding.    Reason for Disposition . [1] Patient is NOT HIGH RISK AND [2] strongly requests antiviral medicine AND [3] flu symptoms present < 48 hours  Answer Assessment - Initial Assessment Questions 1. WORST SYMPTOM: "What is your worst symptom?" (e.g., cough, runny nose, muscle aches, headache, sore throat, fever)      Fatigue  Chills  headache 2. ONSET: "When did your flu symptoms start?"      last 3. COUGH: "How bad is the cough?"       A little  4. RESPIRATORY DISTRESS: "Describe your breathing."      normal 5. FEVER: "Do you have a fever?" If so, ask: "What is your temperature, how was it measured, and when did it start?"    A little  hasnt  measured 6. EXPOSURE: "Were you exposed to someone with influenza?"        Side  Effect ot  Shingle  vacination7. FLU VACCINE: "Did you get a flu shot this year?"      8. HIGH RISK DISEASE: "Do you any chronic medical problems?" (e.g., heart or lung disease, asthma, weak immune system, or other HIGH RISK conditions)     na 9. PREGNANCY: "Is there any chance you are pregnant?" "When was your last menstrual period?"  na     na10. OTHER SYMPTOMS: "Do you have any other symptoms?"  (e.g., runny nose, muscle aches, headache, sore throat)       Headache, muscle  aches  Protocols used: INFLUENZA - SEASONAL-A-AH

## 2018-11-07 ENCOUNTER — Other Ambulatory Visit: Payer: Self-pay | Admitting: Gastroenterology

## 2018-11-10 ENCOUNTER — Other Ambulatory Visit: Payer: Self-pay | Admitting: Gastroenterology

## 2018-11-17 DIAGNOSIS — H31003 Unspecified chorioretinal scars, bilateral: Secondary | ICD-10-CM | POA: Diagnosis not present

## 2018-11-17 DIAGNOSIS — H26491 Other secondary cataract, right eye: Secondary | ICD-10-CM | POA: Diagnosis not present

## 2018-12-31 ENCOUNTER — Other Ambulatory Visit: Payer: Self-pay

## 2018-12-31 ENCOUNTER — Ambulatory Visit (INDEPENDENT_AMBULATORY_CARE_PROVIDER_SITE_OTHER): Payer: BC Managed Care – PPO | Admitting: Internal Medicine

## 2018-12-31 ENCOUNTER — Encounter: Payer: Self-pay | Admitting: Internal Medicine

## 2018-12-31 VITALS — BP 116/82 | HR 76 | Temp 98.3°F | Ht 70.0 in | Wt 199.0 lb

## 2018-12-31 DIAGNOSIS — M25512 Pain in left shoulder: Secondary | ICD-10-CM | POA: Insufficient documentation

## 2018-12-31 MED ORDER — ETODOLAC ER 400 MG PO TB24: 400.0000 mg | ORAL_TABLET | Freq: Every day | ORAL | 0 refills | Status: DC

## 2018-12-31 NOTE — Progress Notes (Signed)
Subjective:  Patient ID: [name], male    DOB: 09/30/1950  Age: 68 y.o. MRN: [id number]  CC: Shoulder Pain   HPI [name] presents for a certain about his left shoulder.  He denies any recent trauma or injury.  He had a frozen shoulder on the left side many years ago.  He tells me for the last  3 weeks he has had worsening pain and decreased range of motion in the left shoulder.  There is no radicular component to the pain.  He is not getting much symptom relief with over-the-counter doses of Aleve.  Outpatient Medications Prior to Visit  Medication Sig Dispense Refill  . famotidine (PEPCID) 40 MG tablet TAKE 1 TABLET AT BEDTIME 90 tablet 3  . pantoprazole (PROTONIX) 40 MG tablet TAKE 1 TABLET TWICE A DAY BEFORE MEALS 180 tablet 3   No facility-administered medications prior to visit.     ROS Review of Systems  Musculoskeletal: Positive for arthralgias. Negative for myalgias and neck pain.  Neurological: Negative for weakness and numbness.  All other systems reviewed and are negative.   Objective:  BP 116/82 (BP Location: Left Arm, Patient Position: Sitting, Cuff Size: Large)   Pulse 76   Temp 98.3 F (36.8 C) (Oral)   Ht 5\' 10"  (1.778 m)   Wt 199 lb (90.3 kg)   SpO2 98%   BMI 28.55 kg/m   BP Readings from Last 3 Encounters:  12/31/18 116/82  08/13/18 126/84  05/07/18 116/82    Wt Readings from Last 3 Encounters:  12/31/18 199 lb (90.3 kg)  08/13/18 198 lb 8 oz (90 kg)  05/07/18 201 lb (91.2 kg)    Physical Exam Musculoskeletal:     Left shoulder: He exhibits decreased range of motion. He exhibits no bony tenderness, no swelling, no effusion, no deformity, no pain and no spasm.     Cervical back: Normal. He exhibits normal range of motion, no tenderness, no bony tenderness and no deformity.     Comments: Some discomfort is noted with rotator cuff testing.  The pouring test in his left hand did not elicit any discomfort.     Lab Results   Component Value Date   WBC 8.6 05/07/2018   HGB 17.1 (H) 05/07/2018   HCT 49.8 05/07/2018   PLT 276.0 05/07/2018   GLUCOSE 91 05/07/2018   CHOL 176 05/07/2018   TRIG 196.0 (H) 05/07/2018   HDL 43.10 05/07/2018   LDLCALC 93 05/07/2018   ALT 19 08/13/2018   AST 16 08/13/2018   NA 137 05/07/2018   K 4.3 05/07/2018   CL 102 05/07/2018   CREATININE 1.23 05/07/2018   BUN 18 05/07/2018   CO2 26 05/07/2018   TSH 1.61 05/07/2018   PSA 0.54 05/07/2018   INR 1.1 (H) 03/20/2017   HGBA1C 4.9 10/08/2011    Ct Abdomen Pelvis W Wo Contrast  Result Date: 04/04/2017 CLINICAL DATA:  Two week history of intermittent left flank pain and left upper quadrant abdominal pain. Episode of gross hematuria. EXAM: CT ABDOMEN AND PELVIS WITHOUT AND WITH CONTRAST TECHNIQUE: Multidetector CT imaging of the abdomen and pelvis was performed following the standard protocol before and following the bolus administration of intravenous contrast. CONTRAST:  156mL ISOVUE-300 IOPAMIDOL (ISOVUE-300) INJECTION 61% COMPARISON:  CT scan 10/04/2011 FINDINGS: Lower chest: The lung bases are clear of acute process. No pleural effusion or pulmonary lesions. The heart is normal in size. No pericardial effusion. There is a slipped Nissen fundoplication  noted. Hepatobiliary: No focal hepatic lesions or intrahepatic biliary dilatation. The gallbladder is surgically absent. No common bile duct dilatation. Pancreas: No mass, inflammation or ductal dilatation. Spleen: Normal size.  No focal lesions. Adrenals/Urinary Tract: The adrenal glands and kidneys are normal. No renal, ureteral or bladder calculi or mass. Both kidneys demonstrate normal perfusion/ enhancement. Tiny lower pole left renal cyst. The delayed images do not demonstrate any significant collecting system abnormalities. Both the both ureters are normal. The bladder is normal. Stomach/Bowel: The stomach, duodenum, small bowel and colon are grossly normal without oral contrast. No  inflammatory changes, mass lesions or obstructive findings. The terminal ileum and appendix are normal. Moderate stool throughout the colon suggesting constipation. There is severe sigmoid diverticulosis without definite findings for acute diverticulitis. Vascular/Lymphatic: The aorta is normal in caliber. No dissection. The branch vessels are patent. The major venous structures are patent. No mesenteric or retroperitoneal mass or adenopathy. Small scattered lymph nodes are noted. Reproductive: The prostate gland and seminal vesicles are unremarkable. Other: Small right inguinal hernia containing fat. Small periumbilical abdominal wall hernia containing fat. Musculoskeletal: No significant bony findings. Moderate degenerative changes involving the lower thoracic spine. IMPRESSION: 1. Slipped Nissen fundoplication. 2. No CT findings to account for the patient's hematuria. No renal, ureteral or bladder calculi or mass. 3. Significant diverticulosis involving the sigmoid colon but no evidence of acute diverticulitis. 4. Moderate stool throughout the colon suggesting constipation. 5. Status post cholecystectomy but no biliary dilatation. Electronically Signed   By: Marijo Sanes M.D.   On: 04/04/2017 15:11    Assessment & Plan:   [name] was seen today for shoulder pain.  Diagnoses and all orders for this visit:  Acute pain of left shoulder- He has a history of (L) frozen shoulder.  At this time I do not yet see evidence of a frozen shoulder.  I am worried that he may have osteoarthritis so I asked him to undergo a plain x-ray.  I am also worried he may have a rotator cuff disorder so I anticipate sending him for physical therapy if the x-ray is not remarkable for osteoarthritis or spurring.  For now I have asked him to continue to work on the range of motion and to upgrade to a prescription NSAID for symptom relief. -     etodolac (LODINE XL) 400 MG 24 hr tablet; Take 1 tablet (400 mg total) by mouth daily.  -     DG Shoulder Left; Future   I am having Donald Pugh. [name] start on etodolac. I am also having him maintain his pantoprazole and famotidine.  Meds ordered this encounter  Medications  . etodolac (LODINE XL) 400 MG 24 hr tablet    Sig: Take 1 tablet (400 mg total) by mouth daily.    Dispense:  90 tablet    Refill:  0     Follow-up: Return in about 3 weeks (around 01/21/2019).  Scarlette Calico, MD

## 2018-12-31 NOTE — Patient Instructions (Signed)
Shoulder Pain Many things can cause shoulder pain, including:  An injury to the shoulder.  Overuse of the shoulder.  Arthritis. The source of the pain can be:  Inflammation.  An injury to the shoulder joint.  An injury to a tendon, ligament, or bone. Follow these instructions at home: Pay attention to changes in your symptoms. Let your health care provider know about them. Follow these instructions to relieve your pain. If you have a sling:  Wear the sling as told by your health care provider. Remove it only as told by your health care provider.  Loosen the sling if your fingers tingle, become numb, or turn cold and blue.  Keep the sling clean.  If the sling is not waterproof: ? Do not let it get wet. Remove it to shower or bathe.  Move your arm as little as possible, but keep your hand moving to prevent swelling. Managing pain, stiffness, and swelling   If directed, put ice on the painful area: ? Put ice in a plastic bag. ? Place a towel between your skin and the bag. ? Leave the ice on for 20 minutes, 2-3 times per day. Stop applying ice if it does not help with the pain.  Squeeze a soft ball or a foam pad as much as possible. This helps to keep the shoulder from swelling. It also helps to strengthen the arm. General instructions  Take over-the-counter and prescription medicines only as told by your health care provider.  Keep all follow-up visits as told by your health care provider. This is important. Contact a health care provider if:  Your pain gets worse.  Your pain is not relieved with medicines.  New pain develops in your arm, hand, or fingers. Get help right away if:  Your arm, hand, or fingers: ? Tingle. ? Become numb. ? Become swollen. ? Become painful. ? Turn white or blue. Summary  Shoulder pain can be caused by an injury, overuse, or arthritis.  Pay attention to changes in your symptoms. Let your health care provider know about them.   This condition may be treated with a sling, ice, and pain medicines.  Contact your health care provider if the pain gets worse or new pain develops. Get help right away if your arm, hand, or fingers tingle or become numb, swollen, or painful.  Keep all follow-up visits as told by your health care provider. This is important. This information is not intended to replace advice given to you by your health care provider. Make sure you discuss any questions you have with your health care provider. Document Released: 03/27/2005 Document Revised: 12/30/2017 Document Reviewed: 12/30/2017 Elsevier Patient Education  2020 Elsevier Inc.  

## 2019-01-06 ENCOUNTER — Ambulatory Visit (INDEPENDENT_AMBULATORY_CARE_PROVIDER_SITE_OTHER)
Admission: RE | Admit: 2019-01-06 | Discharge: 2019-01-06 | Disposition: A | Discharge status: Home / Self Care | Payer: BC Managed Care – PPO | Source: Ambulatory Visit | Attending: Internal Medicine | Admitting: Internal Medicine

## 2019-01-06 ENCOUNTER — Other Ambulatory Visit: Payer: Self-pay | Admitting: Internal Medicine

## 2019-01-06 ENCOUNTER — Other Ambulatory Visit: Payer: Self-pay

## 2019-01-06 DIAGNOSIS — M67912 Unspecified disorder of synovium and tendon, left shoulder: Secondary | ICD-10-CM | POA: Insufficient documentation

## 2019-01-06 DIAGNOSIS — M25512 Pain in left shoulder: Secondary | ICD-10-CM | POA: Diagnosis not present

## 2019-01-22 ENCOUNTER — Ambulatory Visit: Payer: BC Managed Care – PPO | Admitting: Physical Therapy

## 2019-01-22 ENCOUNTER — Other Ambulatory Visit: Payer: Self-pay

## 2019-02-09 ENCOUNTER — Telehealth: Payer: Self-pay | Admitting: Internal Medicine

## 2019-02-09 NOTE — Telephone Encounter (Signed)
Relation to pt: self  Call back number: [phone]    Reason for call:  Patient would like to pick up 01/06/2019 imaging results, patient would like to pick up first thing tomorrow morning please call to confirm best # [phone]

## 2019-02-10 NOTE — Telephone Encounter (Signed)
Results are printed and placed up front to pt to pick up. Pt contacted and informed of same.

## 2019-02-18 DIAGNOSIS — M7552 Bursitis of left shoulder: Secondary | ICD-10-CM | POA: Diagnosis not present

## 2019-03-16 ENCOUNTER — Other Ambulatory Visit: Payer: Self-pay | Admitting: Orthopedic Surgery

## 2019-03-16 DIAGNOSIS — M7552 Bursitis of left shoulder: Secondary | ICD-10-CM

## 2019-03-27 ENCOUNTER — Ambulatory Visit (INDEPENDENT_AMBULATORY_CARE_PROVIDER_SITE_OTHER): Payer: BC Managed Care – PPO

## 2019-03-27 DIAGNOSIS — Z23 Encounter for immunization: Secondary | ICD-10-CM

## 2019-04-09 DIAGNOSIS — M25612 Stiffness of left shoulder, not elsewhere classified: Secondary | ICD-10-CM | POA: Diagnosis not present

## 2019-04-09 DIAGNOSIS — M25512 Pain in left shoulder: Secondary | ICD-10-CM | POA: Diagnosis not present

## 2019-04-09 DIAGNOSIS — M7502 Adhesive capsulitis of left shoulder: Secondary | ICD-10-CM | POA: Diagnosis not present

## 2019-04-09 DIAGNOSIS — R6889 Other general symptoms and signs: Secondary | ICD-10-CM | POA: Diagnosis not present

## 2019-04-16 ENCOUNTER — Telehealth: Payer: Self-pay

## 2019-04-16 DIAGNOSIS — M25512 Pain in left shoulder: Secondary | ICD-10-CM | POA: Diagnosis not present

## 2019-04-16 DIAGNOSIS — R6889 Other general symptoms and signs: Secondary | ICD-10-CM | POA: Diagnosis not present

## 2019-04-16 DIAGNOSIS — M25612 Stiffness of left shoulder, not elsewhere classified: Secondary | ICD-10-CM | POA: Diagnosis not present

## 2019-04-16 DIAGNOSIS — M7502 Adhesive capsulitis of left shoulder: Secondary | ICD-10-CM | POA: Diagnosis not present

## 2019-04-16 NOTE — Telephone Encounter (Signed)
Copied from [place] [id number]. Topic: General - Inquiry >> Apr 16, 2019 10:28 AM Alease Frame wrote: Reason for CRM: Patient called in to speak with Dr Judi Cong nurse . Hes wanting to know when he had his physical therapy appt and the location . Please advise  Call back number [phone]

## 2019-04-20 ENCOUNTER — Ambulatory Visit: Payer: BLUE CROSS/BLUE SHIELD

## 2019-04-21 ENCOUNTER — Other Ambulatory Visit: Payer: Self-pay

## 2019-04-21 ENCOUNTER — Ambulatory Visit (INDEPENDENT_AMBULATORY_CARE_PROVIDER_SITE_OTHER): Payer: BC Managed Care – PPO

## 2019-04-21 DIAGNOSIS — Z23 Encounter for immunization: Secondary | ICD-10-CM

## 2019-04-23 DIAGNOSIS — M25612 Stiffness of left shoulder, not elsewhere classified: Secondary | ICD-10-CM | POA: Diagnosis not present

## 2019-04-23 DIAGNOSIS — R6889 Other general symptoms and signs: Secondary | ICD-10-CM | POA: Diagnosis not present

## 2019-04-23 DIAGNOSIS — M7502 Adhesive capsulitis of left shoulder: Secondary | ICD-10-CM | POA: Diagnosis not present

## 2019-04-23 DIAGNOSIS — M25512 Pain in left shoulder: Secondary | ICD-10-CM | POA: Diagnosis not present

## 2019-04-25 ENCOUNTER — Other Ambulatory Visit: Payer: Self-pay

## 2019-04-25 ENCOUNTER — Ambulatory Visit
Admission: RE | Admit: 2019-04-25 | Discharge: 2019-04-25 | Disposition: A | Discharge status: Home / Self Care | Payer: BC Managed Care – PPO | Source: Ambulatory Visit | Attending: Orthopedic Surgery | Admitting: Orthopedic Surgery

## 2019-04-25 DIAGNOSIS — M25412 Effusion, left shoulder: Secondary | ICD-10-CM | POA: Diagnosis not present

## 2019-04-25 DIAGNOSIS — M19012 Primary osteoarthritis, left shoulder: Secondary | ICD-10-CM | POA: Diagnosis not present

## 2019-04-25 DIAGNOSIS — M7552 Bursitis of left shoulder: Secondary | ICD-10-CM

## 2019-04-30 DIAGNOSIS — M25612 Stiffness of left shoulder, not elsewhere classified: Secondary | ICD-10-CM | POA: Diagnosis not present

## 2019-04-30 DIAGNOSIS — R6889 Other general symptoms and signs: Secondary | ICD-10-CM | POA: Diagnosis not present

## 2019-04-30 DIAGNOSIS — M7502 Adhesive capsulitis of left shoulder: Secondary | ICD-10-CM | POA: Diagnosis not present

## 2019-04-30 DIAGNOSIS — M25512 Pain in left shoulder: Secondary | ICD-10-CM | POA: Diagnosis not present

## 2019-05-04 DIAGNOSIS — M25612 Stiffness of left shoulder, not elsewhere classified: Secondary | ICD-10-CM | POA: Diagnosis not present

## 2019-05-04 DIAGNOSIS — M25512 Pain in left shoulder: Secondary | ICD-10-CM | POA: Diagnosis not present

## 2019-05-04 DIAGNOSIS — R6889 Other general symptoms and signs: Secondary | ICD-10-CM | POA: Diagnosis not present

## 2019-05-04 DIAGNOSIS — M7502 Adhesive capsulitis of left shoulder: Secondary | ICD-10-CM | POA: Diagnosis not present

## 2019-05-12 ENCOUNTER — Encounter: Payer: Self-pay | Admitting: Gastroenterology

## 2019-05-13 DIAGNOSIS — M25612 Stiffness of left shoulder, not elsewhere classified: Secondary | ICD-10-CM | POA: Diagnosis not present

## 2019-05-13 DIAGNOSIS — M7502 Adhesive capsulitis of left shoulder: Secondary | ICD-10-CM | POA: Diagnosis not present

## 2019-05-13 DIAGNOSIS — R6889 Other general symptoms and signs: Secondary | ICD-10-CM | POA: Diagnosis not present

## 2019-05-13 DIAGNOSIS — M25512 Pain in left shoulder: Secondary | ICD-10-CM | POA: Diagnosis not present

## 2019-05-18 DIAGNOSIS — M25512 Pain in left shoulder: Secondary | ICD-10-CM | POA: Diagnosis not present

## 2019-05-18 DIAGNOSIS — R6889 Other general symptoms and signs: Secondary | ICD-10-CM | POA: Diagnosis not present

## 2019-05-18 DIAGNOSIS — M25612 Stiffness of left shoulder, not elsewhere classified: Secondary | ICD-10-CM | POA: Diagnosis not present

## 2019-05-18 DIAGNOSIS — M7502 Adhesive capsulitis of left shoulder: Secondary | ICD-10-CM | POA: Diagnosis not present

## 2019-05-25 DIAGNOSIS — R6889 Other general symptoms and signs: Secondary | ICD-10-CM | POA: Diagnosis not present

## 2019-05-25 DIAGNOSIS — M25512 Pain in left shoulder: Secondary | ICD-10-CM | POA: Diagnosis not present

## 2019-05-25 DIAGNOSIS — M25612 Stiffness of left shoulder, not elsewhere classified: Secondary | ICD-10-CM | POA: Diagnosis not present

## 2019-05-25 DIAGNOSIS — M7502 Adhesive capsulitis of left shoulder: Secondary | ICD-10-CM | POA: Diagnosis not present

## 2019-06-11 DIAGNOSIS — M25512 Pain in left shoulder: Secondary | ICD-10-CM | POA: Diagnosis not present

## 2019-06-11 DIAGNOSIS — M7502 Adhesive capsulitis of left shoulder: Secondary | ICD-10-CM | POA: Diagnosis not present

## 2019-06-11 DIAGNOSIS — R6889 Other general symptoms and signs: Secondary | ICD-10-CM | POA: Diagnosis not present

## 2019-06-11 DIAGNOSIS — M25612 Stiffness of left shoulder, not elsewhere classified: Secondary | ICD-10-CM | POA: Diagnosis not present

## 2019-06-22 DIAGNOSIS — G8929 Other chronic pain: Secondary | ICD-10-CM | POA: Diagnosis not present

## 2019-06-22 DIAGNOSIS — M7502 Adhesive capsulitis of left shoulder: Secondary | ICD-10-CM | POA: Diagnosis not present

## 2019-06-22 DIAGNOSIS — M25512 Pain in left shoulder: Secondary | ICD-10-CM | POA: Diagnosis not present

## 2019-07-20 ENCOUNTER — Ambulatory Visit: Payer: BC Managed Care – PPO | Attending: Internal Medicine

## 2019-07-20 DIAGNOSIS — Z23 Encounter for immunization: Secondary | ICD-10-CM | POA: Insufficient documentation

## 2019-07-20 NOTE — Progress Notes (Signed)
   Covid-19 Vaccination Clinic  Name:  [name]    MRN: [id number] DOB: Dec 07, 1950  07/20/2019  [name] was observed post Covid-19 immunization for 15 minutes without incidence. He was provided with Vaccine Information Sheet and instruction to access the V-Safe system.   [name] was instructed to call 911 with any severe reactions post vaccine: Marland Kitchen Difficulty breathing  . Swelling of your face and throat  . A fast heartbeat  . A bad rash all over your body  . Dizziness and weakness    Immunizations Administered    Name Date Dose VIS Date Route   Pfizer COVID-19 Vaccine 07/20/2019  2:50 PM 0.3 mL 06/11/2019 Intramuscular   Manufacturer: [place]   Lot: [id number]   [place]: [place] COVID-19 Vaccine 07/20/2019  2:52 PM 0.3 mL 06/11/2019 Intramuscular   Manufacturer: [place]   Lot: [id number]   [place]: [id number]

## 2019-07-23 DIAGNOSIS — M25512 Pain in left shoulder: Secondary | ICD-10-CM | POA: Diagnosis not present

## 2019-07-23 DIAGNOSIS — M25612 Stiffness of left shoulder, not elsewhere classified: Secondary | ICD-10-CM | POA: Diagnosis not present

## 2019-07-23 DIAGNOSIS — M7502 Adhesive capsulitis of left shoulder: Secondary | ICD-10-CM | POA: Diagnosis not present

## 2019-07-23 DIAGNOSIS — R6889 Other general symptoms and signs: Secondary | ICD-10-CM | POA: Diagnosis not present

## 2019-08-01 ENCOUNTER — Ambulatory Visit: Payer: BC Managed Care – PPO

## 2019-08-03 ENCOUNTER — Ambulatory Visit: Payer: BC Managed Care – PPO

## 2019-08-11 ENCOUNTER — Ambulatory Visit: Payer: BC Managed Care – PPO | Attending: Internal Medicine

## 2019-08-11 DIAGNOSIS — Z23 Encounter for immunization: Secondary | ICD-10-CM | POA: Insufficient documentation

## 2019-08-11 NOTE — Progress Notes (Signed)
   Covid-19 Vaccination Clinic  Name:  [name]    MRN: [id number] DOB: 1951/01/16  08/11/2019  Mr. [name] was observed post Covid-19 immunization for 15 minutes without incidence. He was provided with Vaccine Information Sheet and instruction to access the V-Safe system.   Mr. [name] was instructed to call 911 with any severe reactions post vaccine: Marland Kitchen Difficulty breathing  . Swelling of your face and throat  . A fast heartbeat  . A bad rash all over your body  . Dizziness and weakness    Immunizations Administered    Name Date Dose VIS Date Route   Pfizer COVID-19 Vaccine 08/11/2019  8:16 AM 0.3 mL 06/11/2019 Intramuscular   Manufacturer: [place]   Lot: [id number]   [place]: [id number]

## 2019-09-02 ENCOUNTER — Other Ambulatory Visit: Payer: Self-pay

## 2019-09-02 MED ORDER — FAMOTIDINE 40 MG PO TABS: 40.0000 mg | ORAL_TABLET | Freq: Every day | ORAL | 0 refills | Status: DC

## 2019-09-02 MED ORDER — PANTOPRAZOLE SODIUM 40 MG PO TBEC: DELAYED_RELEASE_TABLET | ORAL | 0 refills | Status: DC

## 2019-11-03 ENCOUNTER — Other Ambulatory Visit: Payer: Self-pay | Admitting: Gastroenterology

## 2019-11-23 DIAGNOSIS — H524 Presbyopia: Secondary | ICD-10-CM | POA: Diagnosis not present

## 2019-11-23 DIAGNOSIS — H31003 Unspecified chorioretinal scars, bilateral: Secondary | ICD-10-CM | POA: Diagnosis not present

## 2019-11-23 DIAGNOSIS — H26491 Other secondary cataract, right eye: Secondary | ICD-10-CM | POA: Diagnosis not present

## 2019-11-23 DIAGNOSIS — H43813 Vitreous degeneration, bilateral: Secondary | ICD-10-CM | POA: Diagnosis not present

## 2019-12-04 ENCOUNTER — Other Ambulatory Visit: Payer: Self-pay | Admitting: Gastroenterology

## 2019-12-06 ENCOUNTER — Telehealth: Payer: Self-pay | Admitting: Gastroenterology

## 2019-12-06 MED ORDER — FAMOTIDINE 40 MG PO TABS: 40.0000 mg | ORAL_TABLET | Freq: Every day | ORAL | 0 refills | Status: DC

## 2019-12-06 NOTE — Telephone Encounter (Signed)
Informed patient he is due for follow up visit. Patient scheduled appt for 01/20/20 at 9:10am. Prescription sent to patients pharmacy until scheduled appt.

## 2019-12-06 NOTE — Telephone Encounter (Signed)
Patient calling for refill on Pepcid

## 2020-01-18 ENCOUNTER — Other Ambulatory Visit: Payer: Self-pay | Admitting: Gastroenterology

## 2020-01-20 ENCOUNTER — Ambulatory Visit: Payer: Medicare Other | Admitting: Gastroenterology

## 2020-01-20 ENCOUNTER — Encounter: Payer: Self-pay | Admitting: Gastroenterology

## 2020-01-20 VITALS — BP 110/74 | HR 84 | Ht 70.0 in | Wt 195.0 lb

## 2020-01-20 DIAGNOSIS — K2270 Barrett's esophagus without dysplasia: Secondary | ICD-10-CM

## 2020-01-20 MED ORDER — PANTOPRAZOLE SODIUM 40 MG PO TBEC: DELAYED_RELEASE_TABLET | ORAL | 3 refills | Status: DC

## 2020-01-20 MED ORDER — FAMOTIDINE 40 MG PO TABS: 40.0000 mg | ORAL_TABLET | Freq: Every day | ORAL | 3 refills | Status: DC

## 2020-01-20 NOTE — Progress Notes (Signed)
    History of Present Illness: This is a 69 year old male with Barrett's esophagus.  He relates his reflux symptoms are well controlled on his current regimen.  He feels that famotidine causes dyspeptic symptoms however when he takes it with a small amount of milk he does not experience dyspepsia.  No other gastrointestinal complaints.  Current Medications, Allergies, Past Medical History, Past Surgical History, Family History and Social History were reviewed in [employer] record.   Physical Exam: General: Well developed, well nourished, no acute distress Head: Normocephalic and atraumatic Eyes:  sclerae anicteric, EOMI Ears: Normal auditory acuity Mouth: Not examined, mask on during Covid-19 pandemic Lungs: Clear throughout to auscultation Heart: Regular rate and rhythm; no murmurs, rubs or bruits Abdomen: Soft, non tender and non distended. No masses, hepatosplenomegaly or hernias noted. Normal Bowel sounds Rectal: Not done Musculoskeletal: Symmetrical with no gross deformities  Pulses:  Normal pulses noted Extremities: No clubbing, cyanosis, edema or deformities noted Neurological: Alert oriented x 4, grossly nonfocal Psychological:  Alert and cooperative. Normal mood and affect   Assessment and Recommendations:  1. Barrett's short segment without dysplasia.  GERD.  Continue pantoprazole 40 mg po bid and famotidine 40 mg po hs. Follow antireflux measures. Schedule surveillance EGD. The risks (including bleeding, perforation, infection, missed lesions, medication reactions and possible hospitalization or surgery if complications occur), benefits, and alternatives to endoscopy with possible biopsy and possible dilation were discussed with the patient and they consent to proceed.   2.  Personal history of adenomatous colon polyps.  Surveillance colonoscopy is recommended in July 2024.

## 2020-01-20 NOTE — Patient Instructions (Signed)
We have sent the following prescriptions to your mail in pharmacy: pantoprazole and famotidine.   If you have not heard from your mail in pharmacy within 1 week or if you have not received your medication in the mail, please contact us at [phone] so we may find out why.  You have been scheduled for an endoscopy. Please follow written instructions given to you at your visit today. If you use inhalers (even only as needed), please bring them with you on the day of your procedure.   Thank you for choosing me and [facility].  Pricilla Riffle. Dagoberto Ligas., MD., Marval Regal

## 2020-01-21 ENCOUNTER — Other Ambulatory Visit: Payer: Self-pay

## 2020-01-21 ENCOUNTER — Ambulatory Visit (AMBULATORY_SURGERY_CENTER): Payer: Medicare Other | Admitting: Gastroenterology

## 2020-01-21 ENCOUNTER — Encounter: Payer: Self-pay | Admitting: Gastroenterology

## 2020-01-21 VITALS — BP 129/87 | HR 69 | Temp 97.3°F | Resp 13 | Ht 70.0 in | Wt 195.0 lb

## 2020-01-21 DIAGNOSIS — K317 Polyp of stomach and duodenum: Secondary | ICD-10-CM | POA: Diagnosis not present

## 2020-01-21 DIAGNOSIS — K449 Diaphragmatic hernia without obstruction or gangrene: Secondary | ICD-10-CM | POA: Diagnosis not present

## 2020-01-21 DIAGNOSIS — K2270 Barrett's esophagus without dysplasia: Secondary | ICD-10-CM

## 2020-01-21 MED ORDER — SODIUM CHLORIDE 0.9 % IV SOLN: 500.0000 mL | Freq: Once | INTRAVENOUS | Status: DC

## 2020-01-21 NOTE — Progress Notes (Signed)
829 Robinul 0.1 mg IV given due large amount of secretions upon assessment.  MD made aware, vss

## 2020-01-21 NOTE — Progress Notes (Signed)
Report given to PACU, vss 

## 2020-01-21 NOTE — Patient Instructions (Signed)
Handout given: Hiatal Hernia Resume previous diet  continue present medications  await pathology results Please repeat upper endoscopy in 3 years for surveillance of Barretts   YOU HAD AN ENDOSCOPIC PROCEDURE TODAY AT [facility]:   Refer to the procedure report that was given to you for any specific questions about what was found during the examination.  If the procedure report does not answer your questions, please call your gastroenterologist to clarify.  If you requested that your care partner not be given the details of your procedure findings, then the procedure report has been included in a sealed envelope for you to review at your convenience later.  YOU SHOULD EXPECT: Some feelings of bloating in the abdomen. Passage of more gas than usual.  Walking can help get rid of the air that was put into your GI tract during the procedure and reduce the bloating. If you had a lower endoscopy (such as a colonoscopy or flexible sigmoidoscopy) you may notice spotting of blood in your stool or on the toilet paper. If you underwent a bowel prep for your procedure, you may not have a normal bowel movement for a few days.  Please Note:  You might notice some irritation and congestion in your nose or some drainage.  This is from the oxygen used during your procedure.  There is no need for concern and it should clear up in a day or so.  SYMPTOMS TO REPORT IMMEDIATELY:    Following upper endoscopy (EGD)  Vomiting of blood or coffee ground material  New chest pain or pain under the shoulder blades  Painful or persistently difficult swallowing  New shortness of breath  Fever of 100F or higher  Black, tarry-looking stools  For urgent or emergent issues, a gastroenterologist can be reached at any hour by calling [phone]. Do not use MyChart messaging for urgent concerns.    DIET:  We do recommend a small meal at first, but then you may proceed to your regular diet.  Drink plenty  of fluids but you should avoid alcoholic beverages for 24 hours.  ACTIVITY:  You should plan to take it easy for the rest of today and you should NOT DRIVE or use heavy machinery until tomorrow (because of the sedation medicines used during the test).    FOLLOW UP: Our staff will call the number listed on your records 48-72 hours following your procedure to check on you and address any questions or concerns that you may have regarding the information given to you following your procedure. If we do not reach you, we will leave a message.  We will attempt to reach you two times.  During this call, we will ask if you have developed any symptoms of COVID 19. If you develop any symptoms (ie: fever, flu-like symptoms, shortness of breath, cough etc.) before then, please call [phone].  If you test positive for Covid 19 in the 2 weeks post procedure, please call and report this information to [place].    If any biopsies were taken you will be contacted by phone or by letter within the next 1-3 weeks.  Please call us at [phone] if you have not heard about the biopsies in 3 weeks.    SIGNATURES/CONFIDENTIALITY: You and/or your care partner have signed paperwork which will be entered into your electronic medical record.  These signatures attest to the fact that that the information above on your After Visit Summary has been reviewed and is understood.  Full responsibility of the confidentiality of this discharge information lies with you and/or your care-partner.

## 2020-01-21 NOTE — Progress Notes (Signed)
Pt's states no medical or surgical changes since previsit or office visit. 

## 2020-01-21 NOTE — Op Note (Signed)
[name]: [name] Procedure Date: 01/21/2020 8:25 AM MRN: [id number] Endoscopist: Ladene Artist , MD Age: 69 Referring MD:  Date of Birth: Aug 22, 1950 Gender: Male Account #: [id number] Procedure:                Upper GI endoscopy Indications:              Surveillance for malignancy due to personal history                            of Barrett's esophagus Medicines:                Monitored Anesthesia Care Procedure:                Pre-Anesthesia Assessment:                           - Prior to the procedure, a History and Physical                            was performed, and patient medications and                            allergies were reviewed. The patient's tolerance of                            previous anesthesia was also reviewed. The risks                            and benefits of the procedure and the sedation                            options and risks were discussed with the patient.                            All questions were answered, and informed consent                            was obtained. Prior Anticoagulants: The patient has                            taken no previous anticoagulant or antiplatelet                            agents. ASA Grade Assessment: II - A patient with                            mild systemic disease. After reviewing the risks                            and benefits, the patient was deemed in                            satisfactory condition to undergo the procedure.  After obtaining informed consent, the endoscope was                            passed under direct vision. Throughout the                            procedure, the patient's blood pressure, pulse, and                            oxygen saturations were monitored continuously. The                            Endoscope was introduced through the mouth, and                            advanced to the second part of  duodenum. The upper                            GI endoscopy was accomplished without difficulty.                            The patient tolerated the procedure well. Scope In: Scope Out: Findings:                 There were esophageal mucosal changes secondary to                            established short-segment Barrett's disease present                            in the distal esophagus. The maximum longitudinal                            extent of these mucosal changes was 3 cm in length.                            Mucosa was biopsied with a cold forceps for                            histology in 4 quadrants at intervals of 1 cm in                            the lower third of the esophagus. One specimen                            bottle was sent to pathology.                           The exam of the esophagus was otherwise normal.                           Seven 5 to 7 mm sessile polyps with no bleeding and  no stigmata of recent bleeding were found in the                            gastric fundus and in the gastric body. Sampling                            biopsies were taken with a cold forceps for                            histology.                           A medium-sized hiatal hernia was present.                           Evidence of a fundoplication was found in the                            cardia and in the gastric fundus. The wrap appeared                            loose. This was traversed.                           Localized mildly erythematous mucosa without                            bleeding was found in the gastric body.                           The exam of the stomach was otherwise normal.                           The duodenal bulb and second portion of the                            duodenum were normal. Complications:            No immediate complications. Estimated Blood Loss:     Estimated blood loss was minimal. Impression:                - Esophageal mucosal changes secondary to                            established short-segment Barrett's disease.                            Biopsied.                           - Seven gastric polyps. Biopsied.                           - Medium-sized hiatal hernia.                           - A  fundoplication was found. The wrap appears                            loose.                           - Erythematous mucosa in the gastric body.                           - Normal duodenal bulb and second portion of the                            duodenum. Recommendation:           - Patient has a contact number available for                            emergencies. The signs and symptoms of potential                            delayed complications were discussed with the                            patient. Return to normal activities tomorrow.                            Written discharge instructions were provided to the                            patient.                           - Resume previous diet.                           - Continue present medications.                           - Await pathology results.                           - Repeat upper endoscopy in 3 years for                            surveillance of Barrett's esophagus if no dysplasia. Ladene Artist, MD 01/21/2020 8:52:25 AM This report has been signed electronically.

## 2020-01-25 ENCOUNTER — Telehealth: Payer: Self-pay

## 2020-01-25 NOTE — Telephone Encounter (Signed)
  Follow up Call-  Call back number 01/21/2020 01/06/2018  Post procedure Call Back phone  # [phone]  Permission to leave phone message Yes Yes  Some recent data might be hidden     Patient questions:  Do you have a fever, pain , or abdominal swelling? No. Pain Score  0 *  Have you tolerated food without any problems? Yes.    Have you been able to return to your normal activities? Yes.    Do you have any questions about your discharge instructions: Diet   no Medications  No. Follow up visit  No.  Do you have questions or concerns about your Care? No.  Actions: * If pain score is 4 or above: No action needed, pain <4. 1. Have you developed a fever since your procedure? no  2.   Have you had an respiratory symptoms (SOB or cough) since your procedure? no  3.   Have you tested positive for COVID 19 since your procedure no  4.   Have you had any family members/close contacts diagnosed with the COVID 19 since your procedure?  no   If yes to any of these questions please route to Joylene John, RN and Erenest Rasher, RN

## 2020-02-06 ENCOUNTER — Encounter: Payer: Self-pay | Admitting: Gastroenterology

## 2020-04-03 ENCOUNTER — Other Ambulatory Visit: Payer: Medicare Other

## 2020-04-04 ENCOUNTER — Ambulatory Visit: Payer: Medicare Other | Attending: Internal Medicine

## 2020-04-04 DIAGNOSIS — Z23 Encounter for immunization: Secondary | ICD-10-CM

## 2020-04-04 NOTE — Progress Notes (Signed)
   Covid-19 Vaccination Clinic  Name:  [name]    MRN: [id number] DOB: 19-Feb-1951  04/04/2020  Mr. [name] was observed post Covid-19 immunization for 15 minutes without incident. He was provided with Vaccine Information Sheet and instruction to access the V-Safe system.   Mr. [name] was instructed to call 911 with any severe reactions post vaccine: Marland Kitchen Difficulty breathing  . Swelling of face and throat  . A fast heartbeat  . A bad rash all over body  . Dizziness and weakness

## 2020-05-19 ENCOUNTER — Other Ambulatory Visit: Payer: Self-pay

## 2020-05-19 ENCOUNTER — Ambulatory Visit (INDEPENDENT_AMBULATORY_CARE_PROVIDER_SITE_OTHER): Payer: Medicare Other | Admitting: *Deleted

## 2020-05-19 DIAGNOSIS — Z23 Encounter for immunization: Secondary | ICD-10-CM

## 2020-06-14 DIAGNOSIS — L821 Other seborrheic keratosis: Secondary | ICD-10-CM | POA: Diagnosis not present

## 2020-09-05 ENCOUNTER — Ambulatory Visit (INDEPENDENT_AMBULATORY_CARE_PROVIDER_SITE_OTHER): Payer: Medicare Other | Admitting: Internal Medicine

## 2020-09-05 ENCOUNTER — Other Ambulatory Visit: Payer: Self-pay

## 2020-09-05 ENCOUNTER — Encounter: Payer: Self-pay | Admitting: Internal Medicine

## 2020-09-05 VITALS — BP 122/82 | HR 91 | Temp 98.5°F | Resp 16 | Ht 70.0 in | Wt 188.0 lb

## 2020-09-05 DIAGNOSIS — K219 Gastro-esophageal reflux disease without esophagitis: Secondary | ICD-10-CM

## 2020-09-05 DIAGNOSIS — E785 Hyperlipidemia, unspecified: Secondary | ICD-10-CM

## 2020-09-05 DIAGNOSIS — Z0000 Encounter for general adult medical examination without abnormal findings: Secondary | ICD-10-CM | POA: Diagnosis not present

## 2020-09-05 DIAGNOSIS — N400 Enlarged prostate without lower urinary tract symptoms: Secondary | ICD-10-CM | POA: Diagnosis not present

## 2020-09-05 LAB — BASIC METABOLIC PANEL
BUN: 15 mg/dL (ref 6–23)
CO2: 28 mEq/L (ref 19–32)
Calcium: 9.4 mg/dL (ref 8.4–10.5)
Chloride: 102 mEq/L (ref 96–112)
Creatinine, Ser: 1.21 mg/dL (ref 0.40–1.50)
GFR: 61.99 mL/min (ref >60.00)
Glucose, Bld: 59 mg/dL — ABNORMAL LOW (ref 70–99)
Potassium: 4.3 mEq/L (ref 3.5–5.1)
Sodium: 138 mEq/L (ref 135–145)

## 2020-09-05 LAB — LIPID PANEL
Cholesterol: 165 mg/dL (ref 0–200)
HDL: 49.1 mg/dL (ref >39.00)
LDL Cholesterol: 87 mg/dL (ref 0–99)
NonHDL: 115.91
Total CHOL/HDL Ratio: 3
Triglycerides: 144 mg/dL (ref 0.0–149.0)
VLDL: 28.8 mg/dL (ref 0.0–40.0)

## 2020-09-05 LAB — URINALYSIS, ROUTINE W REFLEX MICROSCOPIC
Bilirubin Urine: NEGATIVE
Hgb urine dipstick: NEGATIVE
Ketones, ur: NEGATIVE
Leukocytes,Ua: NEGATIVE
Nitrite: NEGATIVE
Specific Gravity, Urine: 1.025 (ref 1.000–1.030)
Urine Glucose: NEGATIVE
Urobilinogen, UA: 0.2 (ref 0.0–1.0)
pH: 6 (ref 5.0–8.0)

## 2020-09-05 LAB — CBC WITH DIFFERENTIAL/PLATELET
Basophils Absolute: 0.1 10*3/uL (ref 0.0–0.1)
Basophils Relative: 1.1 % (ref 0.0–3.0)
Eosinophils Absolute: 0.1 10*3/uL (ref 0.0–0.7)
Eosinophils Relative: 2.2 % (ref 0.0–5.0)
HCT: 48.3 % (ref 39.0–52.0)
Hemoglobin: 16.6 g/dL (ref 13.0–17.0)
Lymphocytes Relative: 31.9 % (ref 12.0–46.0)
Lymphs Abs: 2 10*3/uL (ref 0.7–4.0)
MCHC: 34.3 g/dL (ref 30.0–36.0)
MCV: 93.8 fl (ref 78.0–100.0)
Monocytes Absolute: 0.7 10*3/uL (ref 0.1–1.0)
Monocytes Relative: 11 % (ref 3.0–12.0)
Neutro Abs: 3.4 10*3/uL (ref 1.4–7.7)
Neutrophils Relative %: 53.8 % (ref 43.0–77.0)
Platelets: 265 10*3/uL (ref 150.0–400.0)
RBC: 5.15 Mil/uL (ref 4.22–5.81)
RDW: 13 % (ref 11.5–15.5)
WBC: 6.3 10*3/uL (ref 4.0–10.5)

## 2020-09-05 LAB — HEPATIC FUNCTION PANEL
ALT: 13 U/L (ref 0–53)
AST: 15 U/L (ref 0–37)
Albumin: 4.3 g/dL (ref 3.5–5.2)
Alkaline Phosphatase: 78 U/L (ref 39–117)
Bilirubin, Direct: 0.1 mg/dL (ref 0.0–0.3)
Total Bilirubin: 0.7 mg/dL (ref 0.2–1.2)
Total Protein: 6.8 g/dL (ref 6.0–8.3)

## 2020-09-05 LAB — TSH: TSH: 2.08 u[IU]/mL (ref 0.35–4.50)

## 2020-09-05 LAB — PSA: PSA: 0.57 ng/mL (ref 0.10–4.00)

## 2020-09-05 NOTE — Progress Notes (Signed)
Subjective:  Patient ID: [name], male    DOB: 02-02-1951  Age: 70 y.o. MRN: [id number]  CC: Annual Exam and Gastroesophageal Reflux  This visit occurred during the SARS-CoV-2 public health emergency.  Safety protocols were in place, including screening questions prior to the visit, additional usage of staff PPE, and extensive cleaning of exam room while observing appropriate contact time as indicated for disinfecting solutions.    HPI [name] presents for a CPX.  He is active and denies any recent episodes of chest pain, shortness of breath, palpitations, edema, fatigue, dizziness, or lightheadedness.  Outpatient Medications Prior to Visit  Medication Sig Dispense Refill   famotidine (PEPCID) 40 MG tablet Take 1 tablet (40 mg total) by mouth at bedtime. 90 tablet 3   pantoprazole (PROTONIX) 40 MG tablet TAKE 1 TABLET BY MOUTH  TWICE DAILY BEFORE MEALS 180 tablet 3   No facility-administered medications prior to visit.    ROS Review of Systems  Constitutional: Negative for appetite change, diaphoresis, fatigue and unexpected weight change.  HENT: Negative.  Negative for trouble swallowing.   Eyes: Negative.   Respiratory: Negative for cough, chest tightness, shortness of breath and wheezing.   Cardiovascular: Negative for chest pain, palpitations and leg swelling.  Gastrointestinal: Negative for abdominal pain, constipation, diarrhea, nausea and vomiting.  Endocrine: Negative.   Genitourinary: Negative.  Negative for difficulty urinating, penile discharge, scrotal swelling, testicular pain and urgency.  Musculoskeletal: Negative.  Negative for arthralgias, joint swelling and myalgias.  Skin: Negative.  Negative for color change.  Neurological: Negative.  Negative for dizziness, weakness and light-headedness.  Hematological: Negative for adenopathy. Does not bruise/bleed easily.  Psychiatric/Behavioral: Negative.     Objective:  BP 122/82    Pulse 91     Temp 98.5 F (36.9 C) (Oral)    Resp 16    Ht 5\' 10"  (1.778 m)    Wt 188 lb (85.3 kg)    SpO2 98%    BMI 26.98 kg/m   BP Readings from Last 3 Encounters:  09/05/20 122/82  01/21/20 (!) 129/87  01/20/20 110/74    Wt Readings from Last 3 Encounters:  09/05/20 188 lb (85.3 kg)  01/21/20 195 lb (88.5 kg)  01/20/20 195 lb (88.5 kg)    Physical Exam Vitals reviewed.  Constitutional:      Appearance: Normal appearance.  HENT:     Nose: Nose normal.     Mouth/Throat:     Mouth: Mucous membranes are moist.  Eyes:     General: No scleral icterus.    Conjunctiva/sclera: Conjunctivae normal.  Cardiovascular:     Rate and Rhythm: Normal rate and regular rhythm.     Heart sounds: No murmur heard.   Pulmonary:     Effort: Pulmonary effort is normal.     Breath sounds: No stridor. No wheezing, rhonchi or rales.  Abdominal:     General: Abdomen is flat. Bowel sounds are normal. There is no distension.     Palpations: Abdomen is soft. There is no fluid wave, hepatomegaly, splenomegaly or mass.     Tenderness: There is no abdominal tenderness.     Hernia: No hernia is present. There is no hernia in the left inguinal area or right inguinal area.  Genitourinary:    Pubic Area: No rash.      Penis: Normal.      Testes: Normal.        Right: Mass not present.  Left: Mass not present.     Epididymis:     Right: Normal. Not inflamed or enlarged.     Left: Normal. Not inflamed or enlarged.  Musculoskeletal:     Cervical back: Neck supple.  Lymphadenopathy:     Cervical: No cervical adenopathy.     Lower Body: No right inguinal adenopathy. No left inguinal adenopathy.  Neurological:     Mental Status: He is alert.     Lab Results  Component Value Date   WBC 6.3 09/05/2020   HGB 16.6 09/05/2020   HCT 48.3 09/05/2020   PLT 265.0 09/05/2020   GLUCOSE 59 (L) 09/05/2020   CHOL 165 09/05/2020   TRIG 144.0 09/05/2020   HDL 49.10 09/05/2020   LDLCALC 87 09/05/2020   ALT 13  09/05/2020   AST 15 09/05/2020   NA 138 09/05/2020   K 4.3 09/05/2020   CL 102 09/05/2020   CREATININE 1.21 09/05/2020   BUN 15 09/05/2020   CO2 28 09/05/2020   TSH 2.08 09/05/2020   PSA 0.57 09/05/2020   INR 1.1 (H) 03/20/2017   HGBA1C 4.9 10/08/2011    MR SHOULDER LEFT WO CONTRAST  Result Date: 04/26/2019 CLINICAL DATA:  Shoulder pain for 2 months. EXAM: MRI OF THE LEFT SHOULDER WITHOUT CONTRAST TECHNIQUE: Multiplanar, multisequence MR imaging of the shoulder was performed. No intravenous contrast was administered. COMPARISON:  Radiographs dated 01/06/2019. FINDINGS: Rotator cuff: There is slight degenerative changes of the distal supraspinatus tendon. The remainder of the rotator cuff appears normal. Muscles: No atrophy or abnormal signal of the muscles of the rotator cuff. Biceps long head:  Properly located and intact. Acromioclavicular Joint: Minimal AC joint arthropathy. Type 2 acromion. Minimal amount of fluid in the subacromial bursa, not felt to be significant. Glenohumeral Joint: Small glenohumeral joint effusion. No chondral defect. Labrum:  Normal. Bones:  Normal. Other: None IMPRESSION: 1. Slight degenerative changes of the distal supraspinatus tendon. 2. Small glenohumeral joint effusion which could represent synovitis. 3. Otherwise, normal exam. Electronically Signed   By: [name] M.D.   On: 04/26/2019 08:42    Assessment & Plan:   [name] was seen today for annual exam and gastroesophageal reflux.  Diagnoses and all orders for this visit:  Gastroesophageal reflux disease without esophagitis- His sx's are well controlled. -     CBC with Differential/Platelet; Future -     Basic metabolic panel; Future -     Basic metabolic panel -     CBC with Differential/Platelet  Hyperlipidemia LDL goal <130- His ASCVD risk score is <15% so I did not recommend a statin for CV risk reduction. -     Lipid panel; Future -     TSH; Future -     Hepatic function panel;  Future -     Basic metabolic panel; Future -     Basic metabolic panel -     Hepatic function panel -     TSH -     Lipid panel  Routine general medical examination at a health care facility- Exam completed, labs reviewed, vaccines are UTD, cancer screenings are UTD, pt ed material was given.  Benign prostatic hyperplasia without lower urinary tract symptoms- His PSA is low and he has no sx's that need to be treated. -     PSA; Future -     Urinalysis, Routine w reflex microscopic; Future -     Basic metabolic panel; Future -     Basic metabolic panel -  Urinalysis, Routine w reflex microscopic -     PSA   I am having [name] maintain his famotidine and pantoprazole.  No orders of the defined types were placed in this encounter.    Follow-up: Return in about 6 months (around 03/08/2021).  Scarlette Calico, MD

## 2020-09-05 NOTE — Patient Instructions (Signed)

## 2020-09-06 ENCOUNTER — Encounter: Payer: Medicare Other | Admitting: Internal Medicine

## 2020-10-05 ENCOUNTER — Other Ambulatory Visit: Payer: Self-pay

## 2020-10-05 ENCOUNTER — Ambulatory Visit: Payer: Medicare Other | Attending: Internal Medicine

## 2020-10-05 DIAGNOSIS — Z23 Encounter for immunization: Secondary | ICD-10-CM

## 2020-10-05 NOTE — Progress Notes (Signed)
   Covid-19 Vaccination Clinic  Name:  ARDIAN HABERLAND    MRN: [id number] DOB: 10-10-1950  10/05/2020  Mr. Koors was observed post Covid-19 immunization for 15 minutes without incident. He was provided with Vaccine Information Sheet and instruction to access the V-Safe system.   Mr. Kleve was instructed to call 911 with any severe reactions post vaccine: Marland Kitchen Difficulty breathing  . Swelling of face and throat  . A fast heartbeat  . A bad rash all over body  . Dizziness and weakness   Immunizations Administered    Name Date Dose VIS Date Route   PFIZER Comrnaty(Gray TOP) Covid-19 Vaccine 10/05/2020  2:30 PM 0.3 mL 06/08/2020 Intramuscular   Manufacturer: [employer]   Lot: [id number]   Van: [phone]

## 2020-10-12 ENCOUNTER — Other Ambulatory Visit (HOSPITAL_BASED_OUTPATIENT_CLINIC_OR_DEPARTMENT_OTHER): Payer: Self-pay

## 2020-10-12 MED ORDER — COVID-19 MRNA VACCINE (PFIZER) 30 MCG/0.3ML IM SUSP
INTRAMUSCULAR | 0 refills | Status: DC
  Filled 2020-10-12: qty 0.3, 1d supply, fill #0

## 2020-12-25 ENCOUNTER — Other Ambulatory Visit: Payer: Self-pay | Admitting: Gastroenterology

## 2021-02-26 DIAGNOSIS — H00015 Hordeolum externum left lower eyelid: Secondary | ICD-10-CM | POA: Diagnosis not present

## 2021-02-26 DIAGNOSIS — H10412 Chronic giant papillary conjunctivitis, left eye: Secondary | ICD-10-CM | POA: Diagnosis not present

## 2021-02-27 ENCOUNTER — Other Ambulatory Visit: Payer: Self-pay | Admitting: Gastroenterology

## 2021-03-07 ENCOUNTER — Telehealth: Payer: Self-pay | Admitting: Internal Medicine

## 2021-03-07 NOTE — Telephone Encounter (Signed)
Left message for patient to call me back at [phone] to schedule Medicare Annual Wellness Visit   No hx of AWV eligible as of 03/01/20  Please schedule at anytime with [facility] Advisor if patient calls the office back.    40 Minutes appointment   Any questions, please call me at [phone]

## 2021-03-21 ENCOUNTER — Other Ambulatory Visit: Payer: Self-pay | Admitting: Gastroenterology

## 2021-03-23 ENCOUNTER — Other Ambulatory Visit: Payer: Self-pay | Admitting: Gastroenterology

## 2021-03-29 ENCOUNTER — Telehealth (INDEPENDENT_AMBULATORY_CARE_PROVIDER_SITE_OTHER): Payer: Medicare Other | Admitting: Family Medicine

## 2021-03-29 ENCOUNTER — Encounter: Payer: Self-pay | Admitting: Family Medicine

## 2021-03-29 DIAGNOSIS — U071 COVID-19: Secondary | ICD-10-CM | POA: Diagnosis not present

## 2021-03-29 MED ORDER — BENZONATATE 200 MG PO CAPS: 200.0000 mg | ORAL_CAPSULE | Freq: Two times a day (BID) | ORAL | 0 refills | Status: DC | PRN

## 2021-03-29 MED ORDER — MOLNUPIRAVIR EUA 200MG CAPSULE: 4.0000 | ORAL_CAPSULE | Freq: Two times a day (BID) | ORAL | 0 refills | Status: AC

## 2021-03-29 NOTE — Progress Notes (Signed)
Virtual Visit via Video Note  I connected with [name]  on 03/29/21 at  5:40 PM EDT by a video enabled telemedicine application and verified that I am speaking with the correct person using two identifiers.  Location patient: home, [place] Location provider:work or home office Persons participating in the virtual visit: patient, provider  I discussed the limitations of evaluation and management by telemedicine and the availability of in person appointments. The patient expressed understanding and agreed to proceed.   HPI:  Acute telemedicine visit for Covid19: -Onset: x1 days, positive home test for covid -Symptoms include: feels tired, temp <100, sore throat, sinus congestion, cough -Denies: CP, SOB, NVD, inability to eat/drink/get out bed -Pertinent past medical history: see below -Pertinent medication allergies:No Known Allergies -COVID-19 vaccine status: 2 doses and 2 boosters -no labs recently; GFR dropping over time and 61 on last labs > 90 days ago, he does not wish to go get labs  ROS: See pertinent positives and negatives per HPI.  Past Medical History:  Diagnosis Date   Barrett's esophagus 07/2012   Blood transfusion without reported diagnosis 2012   Cataract    Diverticulosis    Erosive esophagitis    GERD (gastroesophageal reflux disease)    GI bleed    S/P laparoscopic cholecystectomy for gangrenous GB complicated by bile leak 06/07/2011   Tubular adenoma of colon 07/2012    Past Surgical History:  Procedure Laterality Date   CHOLECYSTECTOMY     COLONOSCOPY     ESOPHAGOGASTRODUODENOSCOPY  10/08/2011   Procedure: ESOPHAGOGASTRODUODENOSCOPY (EGD);  Surgeon: Ladene Artist, MD,FACG;  Location: [facility];  Service: Endoscopy;  Laterality: N/A;   HERNIA REPAIR     NISSEN FUNDOPLICATION       Current Outpatient Medications:    benzonatate (TESSALON) 200 MG capsule, Take 1 capsule (200 mg total) by mouth 2 (two) times daily as needed for cough., Disp: 20 capsule, Rfl:  0   COVID-19 mRNA vaccine, Pfizer, 30 MCG/0.3ML injection, Inject into the muscle., Disp: 0.3 mL, Rfl: 0   famotidine (PEPCID) 40 MG tablet, TAKE 1 TABLET BY MOUTH AT  BEDTIME, Disp: 90 tablet, Rfl: 0   molnupiravir EUA (LAGEVRIO) 200 mg CAPS capsule, Take 4 capsules (800 mg total) by mouth 2 (two) times daily for 5 days., Disp: 40 capsule, Rfl: 0   pantoprazole (PROTONIX) 40 MG tablet, TAKE 1 TABLET BY MOUTH  TWICE DAILY BEFORE MEALS, Disp: 180 tablet, Rfl: 3  EXAM:  VITALS per patient if applicable:  GENERAL: alert, oriented, appears well and in no acute distress  HEENT: atraumatic, conjunttiva clear, no obvious abnormalities on inspection of external nose and ears  NECK: normal movements of the head and neck  LUNGS: on inspection no signs of respiratory distress, breathing rate appears normal, no obvious gross SOB, gasping or wheezing  CV: no obvious cyanosis  MS: moves all visible extremities without noticeable abnormality  PSYCH/NEURO: pleasant and cooperative, no obvious depression or anxiety, speech and thought processing grossly intact  ASSESSMENT AND PLAN:  Discussed the following assessment and plan:  COVID-19   Discussed treatment options (infusions and oral options and risk of drug interactions), ideal treatment window, potential complications, isolation and precautions for COVID-19.  Discussed possibility of rebound with antivirals and the need to reisolate if it should occur for 5 days. Checked for/reviewed any labs done in the last 90 days with GFR listed in HPI if available. After lengthy discussion, the patient opted for treatment with molnupiravir due to being higher risk  for complications of covid or severe disease and other factors. Discussed EUA status of this drug and the fact that there is preliminary limited knowledge of risks/interactions/side effects per EUA document vs possible benefits and precautions. This information was shared with patient during the visit  and also was provided in patient instructions. The patient did want a prescription for cough, Tessalon Rx sent.  Other symptomatic care measures summarized in patient instructions. Advised to seek prompt in person care if worsening, new symptoms arise, or if is not improving with treatment. Discussed options for inperson care if PCP office not available. Did let this patient know that I only do telemedicine on Tuesdays and Thursdays for [place]. Advised to schedule follow up visit with PCP or [facility] if any further questions or concerns to avoid delays in care.   I discussed the assessment and treatment plan with the patient. The patient was provided an opportunity to ask questions and all were answered. The patient agreed with the plan and demonstrated an understanding of the instructions.     Lucretia Kern, DO

## 2021-03-29 NOTE — Patient Instructions (Signed)
HOME CARE TIPS:    -I sent the medication(s) we discussed to your pharmacy: Meds ordered this encounter  Medications   molnupiravir EUA (LAGEVRIO) 200 mg CAPS capsule    Sig: Take 4 capsules (800 mg total) by mouth 2 (two) times daily for 5 days.    Dispense:  40 capsule    Refill:  0   benzonatate (TESSALON) 200 MG capsule    Sig: Take 1 capsule (200 mg total) by mouth 2 (two) times daily as needed for cough.    Dispense:  20 capsule    Refill:  0     -I sent in the [facility] treatment or referral you requested per our discussion. Please see the information provided below and discuss further with the pharmacist/treatment team.   -there is a chance of rebound illness after finishing your treatment. If you become sick again please isolate for an additional 5 days.    -can use tylenol if needed for fevers, aches and pains per instructions  -can use nasal saline a few times per day if you have nasal congestion  -stay hydrated, drink plenty of fluids and eat small healthy meals - avoid dairy  -can take 1000 IU (66mg) Vit D3 and 100-500 mg of Vit C daily per instructions  -If the Covid test is positive, check out the [facility] for more information on home care, transmission and treatment for COVID19  -follow up with your doctor in 2-3 days unless improving and feeling better  -stay home while sick, except to seek medical care. If you have COVID19, ideally it would be best to stay home for a full 10 days since the onset of symptoms PLUS one day of no fever and feeling better. Wear a good mask that fits snugly (such as N95 or KN95) if around others to reduce the risk of transmission.  It was nice to meet you today, and I really hope you are feeling better soon. I help [facility] out with telemedicine visits on Tuesdays and Thursdays and am available for visits on those days. If you have any concerns or questions following this visit please schedule a follow up visit with your Primary  Care doctor or seek care at a local urgent care clinic to avoid delays in care.    Seek in person care or schedule a follow up video visit promptly if your symptoms worsen, new concerns arise or you are not improving with treatment. Call 911 and/or seek emergency care if your symptoms are severe or life threatening.    Fact Sheet for Patients And Caregivers Emergency Use Authorization (EUA) Of LAGEVRIOT (molnupiravir) capsules For Coronavirus Disease 2019 (COVID-19)  What is the most important information I should know about LAGEVRIO? LAGEVRIO may cause serious side effects, including: ? LAGEVRIO may cause harm to your unborn baby. It is not known if LAGEVRIO will harm your baby if you take LAGEVRIO during pregnancy. o LAGEVRIO is not recommended for use in pregnancy. o LAGEVRIO has not been studied in pregnancy. LAGEVRIO was studied in pregnant animals only. When LAGEVRIO was given to pregnant animals, LAGEVRIO caused harm to their unborn babies. o You and your healthcare provider may decide that you should take LAGEVRIO during pregnancy if there are no other COVID-19 treatment options approved or authorized by the FDA that are accessible or clinically appropriate for you. o If you and your healthcare provider decide that you should take LAGEVRIO during pregnancy, you and your healthcare provider should discuss the known and potential  benefits and the potential risks of taking LAGEVRIO during pregnancy. For individuals who are able to become pregnant: ? You should use a reliable method of birth control (contraception) consistently and correctly during treatment with LAGEVRIO and for 4 days after the last dose of LAGEVRIO. Talk to your healthcare provider about reliable birth control methods. ? Before starting treatment with [facility] your healthcare provider may do a pregnancy test to see if you are pregnant before starting treatment with LAGEVRIO. ? Tell your healthcare provider right  away if you become pregnant or think you may be pregnant during treatment with LAGEVRIO. Pregnancy Surveillance Program: ? There is a pregnancy surveillance program for individuals who take LAGEVRIO during pregnancy. The purpose of this program is to collect information about the health of you and your baby. Talk to your healthcare provider about how to take part in this program. ? If you take LAGEVRIO during pregnancy and you agree to participate in the pregnancy surveillance program and allow your healthcare provider to share your information with [place], then your healthcare provider will report your use of Lena during pregnancy to [place]. by calling [phone] or [email or web address]. For individuals who are sexually active with partners who are able to become pregnant: ? It is not known if LAGEVRIO can affect sperm. While the risk is regarded as low, animal studies to fully assess the potential for LAGEVRIO to affect the babies of males treated with LAGEVRIO have not been completed. A reliable method of birth control (contraception) should be used consistently and correctly during treatment with LAGEVRIO and for at least 3 months after the last dose. The risk to sperm beyond 3 months is not known. Studies to understand the risk to sperm beyond 3 months are ongoing. Talk to your healthcare provider about reliable birth control methods. Talk to your healthcare provider if you have questions or concerns about how LAGEVRIO may affect sperm. You are being given this fact sheet because your healthcare provider believes it is necessary to provide you with LAGEVRIO for the treatment of adults with mild-to-moderate coronavirus disease 2019 (COVID-19) with positive results of direct SARS-CoV-2 viral testing, and who are at high risk for progression to severe COVID-19 including hospitalization or death, and for whom other COVID-19 treatment options  approved or authorized by the FDA are not accessible or clinically appropriate. The U.S. Food and Drug Administration (FDA) has issued an Emergency Use Authorization (EUA) to make LAGEVRIO available during the COVID-19 pandemic (for more details about an EUA please see "What is an Emergency Use Authorization?" at the end of this document). LAGEVRIO is not an FDA-approved medicine in the [place]. Read this Fact Sheet for information about LAGEVRIO. Talk to your healthcare provider about your options if you have any questions. It is your choice to take LAGEVRIO.  What is COVID-19? COVID-19 is caused by a virus called a coronavirus. You can get COVID-19 through close contact with another person who has the virus. COVID-19 illnesses have ranged from very mild-to-severe, including illness resulting in death. While information so far suggests that most COVID-19 illness is mild, serious illness can happen and may cause some of your other medical conditions to become worse. Older people and people of all ages with severe, long lasting (chronic) medical conditions like heart disease, lung disease and diabetes, for example seem to be at higher risk of being hospitalized for COVID-19.  What is LAGEVRIO? LAGEVRIO is an investigational medicine used to treat mild-to-moderate  COVID-19 in adults: ? with positive results of direct SARS-CoV-2 viral testing, and ? who are at high risk for progression to severe COVID-19 including hospitalization or death, and for whom other COVID-19 treatment options approved or authorized by the FDA are not accessible or clinically appropriate. The FDA has authorized the emergency use of LAGEVRIO for the treatment of mild-tomoderate COVID-19 in adults under an EUA. For more information on EUA, see the "What is an Emergency Use Authorization (EUA)?" section at the end of this Fact Sheet. LAGEVRIO is not authorized: ? for use in people less than 76 years of age. ? for  prevention of COVID-19. ? for people needing hospitalization for COVID-19. ? for use for longer than 5 consecutive days.  What should I tell my healthcare provider before I take LAGEVRIO? Tell your healthcare provider if you: ? Have any allergies ? Are breastfeeding or plan to breastfeed ? Have any serious illnesses ? Are taking any medicines (prescription, over-the-counter, vitamins, or herbal products).  How do I take LAGEVRIO? ? Take LAGEVRIO exactly as your healthcare provider tells you to take it. ? Take 4 capsules of LAGEVRIO every 12 hours (for example, at 8 am and at 8 pm) ? Take LAGEVRIO for 5 days. It is important that you complete the full 5 days of treatment with LAGEVRIO. Do not stop taking LAGEVRIO before you complete the full 5 days of treatment, even if you feel better. ? Take LAGEVRIO with or without food. ? You should stay in isolation for as long as your healthcare provider tells you to. Talk to your healthcare provider if you are not sure about how to properly isolate while you have COVID-19. ? Swallow LAGEVRIO capsules whole. Do not open, break, or crush the capsules. If you cannot swallow capsules whole, tell your healthcare provider. ? What to do if you miss a dose: o If it has been less than 10 hours since the missed dose, take it as soon as you remember o If it has been more than 10 hours since the missed dose, skip the missed dose and take your dose at the next scheduled time. ? Do not double the dose of LAGEVRIO to make up for a missed dose.  What are the important possible side effects of LAGEVRIO? ? See, "What is the most important information I should know about LAGEVRIO?" ? Allergic Reactions. Allergic reactions can happen in people taking LAGEVRIO, even after only 1 dose. Stop taking LAGEVRIO and call your healthcare provider right away if you get any of the following symptoms of an allergic reaction: o hives o rapid heartbeat o trouble swallowing  or breathing o swelling of the mouth, lips, or face o throat tightness o hoarseness o skin rash The most common side effects of LAGEVRIO are: ? diarrhea ? nausea ? dizziness These are not all the possible side effects of LAGEVRIO. Not many people have taken LAGEVRIO. Serious and unexpected side effects may happen. This medicine is still being studied, so it is possible that all of the risks are not known at this time.  What other treatment choices are there?  Veklury (remdesivir) is FDA-approved as an intravenous (IV) infusion for the treatment of mildto-moderate URKYH-06 in certain adults and children. Talk with your doctor to see if Marijean Heath is appropriate for you. Like LAGEVRIO, FDA may also allow for the emergency use of other medicines to treat people with COVID-19. Go to LacrosseProperties.si for more information. It is your choice to be treated or  not to be treated with LAGEVRIO. Should you decide not to take it, it will not change your standard medical care.  What if I am breastfeeding? Breastfeeding is not recommended during treatment with LAGEVRIO and for 4 days after the last dose of LAGEVRIO. If you are breastfeeding or plan to breastfeed, talk to your healthcare provider about your options and specific situation before taking LAGEVRIO.  How do I report side effects with LAGEVRIO? Contact your healthcare provider if you have any side effects that bother you or do not go away. Report side effects to FDA MedWatch at SmoothHits.hu or call 1-800-FDA-1088 (1- [phone] 390-1969).  How should I store [place]? ? Store LAGEVRIO capsules at room temperature between 2F to 65F (20C to 25C). ? Keep LAGEVRIO and all medicines out of the reach of children and pets. How can I learn more about COVID-19? ? Ask your healthcare provider. ? Visit SeekRooms.co.uk ? Contact your  local or state public health department. ? Call [place] at [phone] (toll free in the [place] ? Visit www.molnupiravir.com  What Is an Emergency Use Authorization (EUA)? The [place] FDA has made [place] available under an emergency access mechanism called an Emergency Use Authorization (EUA) The EUA is supported by a [place], broadcasting Health and Human Service (HHS) declaration that circumstances exist to justify emergency use of drugs and biological products during the COVID-19 pandemic. LAGEVRIO for the treatment of mild-to-moderate COVID-19 in adults with positive results of direct SARS-CoV-2 viral testing, who are at high risk for progression to severe COVID-19, including hospitalization or death, and for whom alternative COVID-19 treatment options approved or authorized by FDA are not accessible or clinically appropriate, has not undergone the same type of review as an FDA-approved product. In issuing an EUA under the [facility]-37 public health emergency, the FDA has determined, among other things, that based on the total amount of scientific evidence available including data from adequate and well-controlled clinical trials, if available, it is reasonable to believe that the product may be effective for diagnosing, treating, or preventing COVID-19, or a serious or life-threatening disease or condition caused by COVID-19; that the known and potential benefits of the product, when used to diagnose, treat, or prevent such disease or condition, outweigh the known and potential risks of such product; and that there are no adequate, approved, and available alternatives.  All of these criteria must be met to allow for the product to be used in the treatment of patients during the COVID-19 pandemic. The EUA for LAGEVRIO is in effect for the duration of the COVID-19 declaration justifying emergency use of LAGEVRIO, unless terminated or revoked (after which LAGEVRIO may no longer be  used under the EUA). For patent information: http://rogers.info/ Copyright  2021-2022 [place], [place] and its affiliates. All rights reserved. [id number] Revised: March 2022

## 2021-04-04 ENCOUNTER — Ambulatory Visit: Payer: Medicare Other | Admitting: Gastroenterology

## 2021-04-06 ENCOUNTER — Telehealth: Payer: Self-pay | Admitting: Internal Medicine

## 2021-04-06 NOTE — Telephone Encounter (Signed)
Patient called office back for an appt today. Recommendations given from Dr. Jenny Reichmann was for the patient to go to Urgent care and be seen today. Patient agreed and understood.

## 2021-04-06 NOTE — Telephone Encounter (Signed)
Patient tested positive for covid last week, saw Colin Benton virtually  Prescribed LAGEVRIOT (molnupiravir) capsules For  Now he has a rash on both legs, small dots all up both legs and needs to know what to do  Routed the call to team health for further triage

## 2021-04-07 DIAGNOSIS — Z20822 Contact with and (suspected) exposure to covid-19: Secondary | ICD-10-CM | POA: Diagnosis not present

## 2021-04-10 ENCOUNTER — Other Ambulatory Visit: Payer: Self-pay | Admitting: Gastroenterology

## 2021-04-12 ENCOUNTER — Other Ambulatory Visit: Payer: Self-pay

## 2021-04-12 MED ORDER — FAMOTIDINE 40 MG PO TABS: 40.0000 mg | ORAL_TABLET | Freq: Every day | ORAL | 0 refills | Status: DC

## 2021-04-13 DIAGNOSIS — H524 Presbyopia: Secondary | ICD-10-CM | POA: Diagnosis not present

## 2021-04-13 DIAGNOSIS — H52203 Unspecified astigmatism, bilateral: Secondary | ICD-10-CM | POA: Diagnosis not present

## 2021-04-13 DIAGNOSIS — H43813 Vitreous degeneration, bilateral: Secondary | ICD-10-CM | POA: Diagnosis not present

## 2021-04-13 DIAGNOSIS — H26491 Other secondary cataract, right eye: Secondary | ICD-10-CM | POA: Diagnosis not present

## 2021-04-24 ENCOUNTER — Ambulatory Visit: Payer: Medicare Other | Attending: Internal Medicine

## 2021-04-24 ENCOUNTER — Other Ambulatory Visit (HOSPITAL_BASED_OUTPATIENT_CLINIC_OR_DEPARTMENT_OTHER): Payer: Self-pay

## 2021-04-24 DIAGNOSIS — Z23 Encounter for immunization: Secondary | ICD-10-CM

## 2021-04-24 MED ORDER — INFLUENZA VAC A&B SA ADJ QUAD 0.5 ML IM PRSY
PREFILLED_SYRINGE | INTRAMUSCULAR | 0 refills | Status: DC
  Filled 2021-04-24: qty 0.5, 1d supply, fill #0

## 2021-04-24 MED ORDER — PFIZER COVID-19 VAC BIVALENT 30 MCG/0.3ML IM SUSP
INTRAMUSCULAR | 0 refills | Status: DC
  Filled 2021-04-24: qty 0.3, 1d supply, fill #0

## 2021-05-02 ENCOUNTER — Ambulatory Visit: Payer: Medicare Other | Admitting: Gastroenterology

## 2021-05-28 DIAGNOSIS — H26491 Other secondary cataract, right eye: Secondary | ICD-10-CM | POA: Diagnosis not present

## 2021-06-07 ENCOUNTER — Ambulatory Visit: Payer: Medicare Other | Admitting: Gastroenterology

## 2021-06-12 ENCOUNTER — Other Ambulatory Visit: Payer: Self-pay | Admitting: Gastroenterology

## 2021-06-13 ENCOUNTER — Other Ambulatory Visit: Payer: Self-pay | Admitting: Gastroenterology

## 2021-07-02 IMAGING — DX LEFT SHOULDER - 2+ VIEW
3 series · 3 of 3 positions shown · non-contrast
Comparison: None.

CLINICAL DATA: Limited range of motion.  Pain without injury.

EXAM:
LEFT SHOULDER - 2+ VIEW

[grashey]
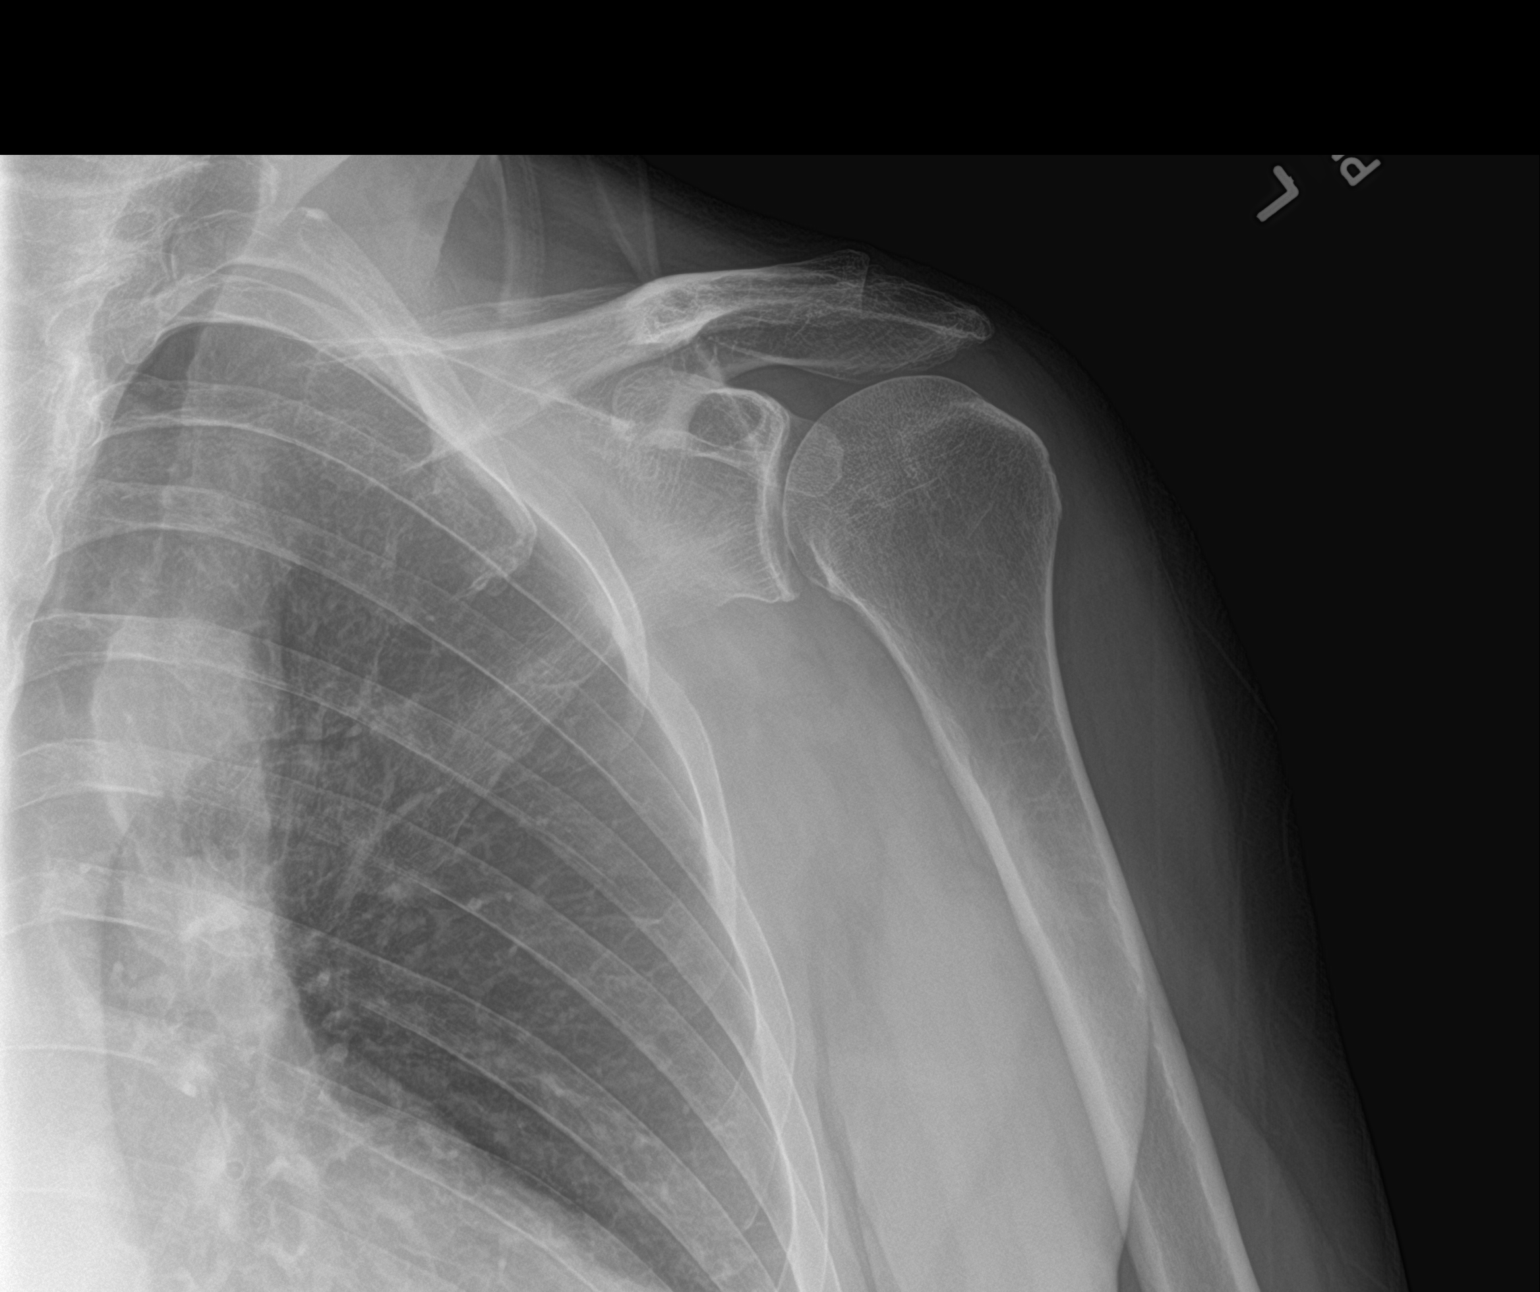

[y view]
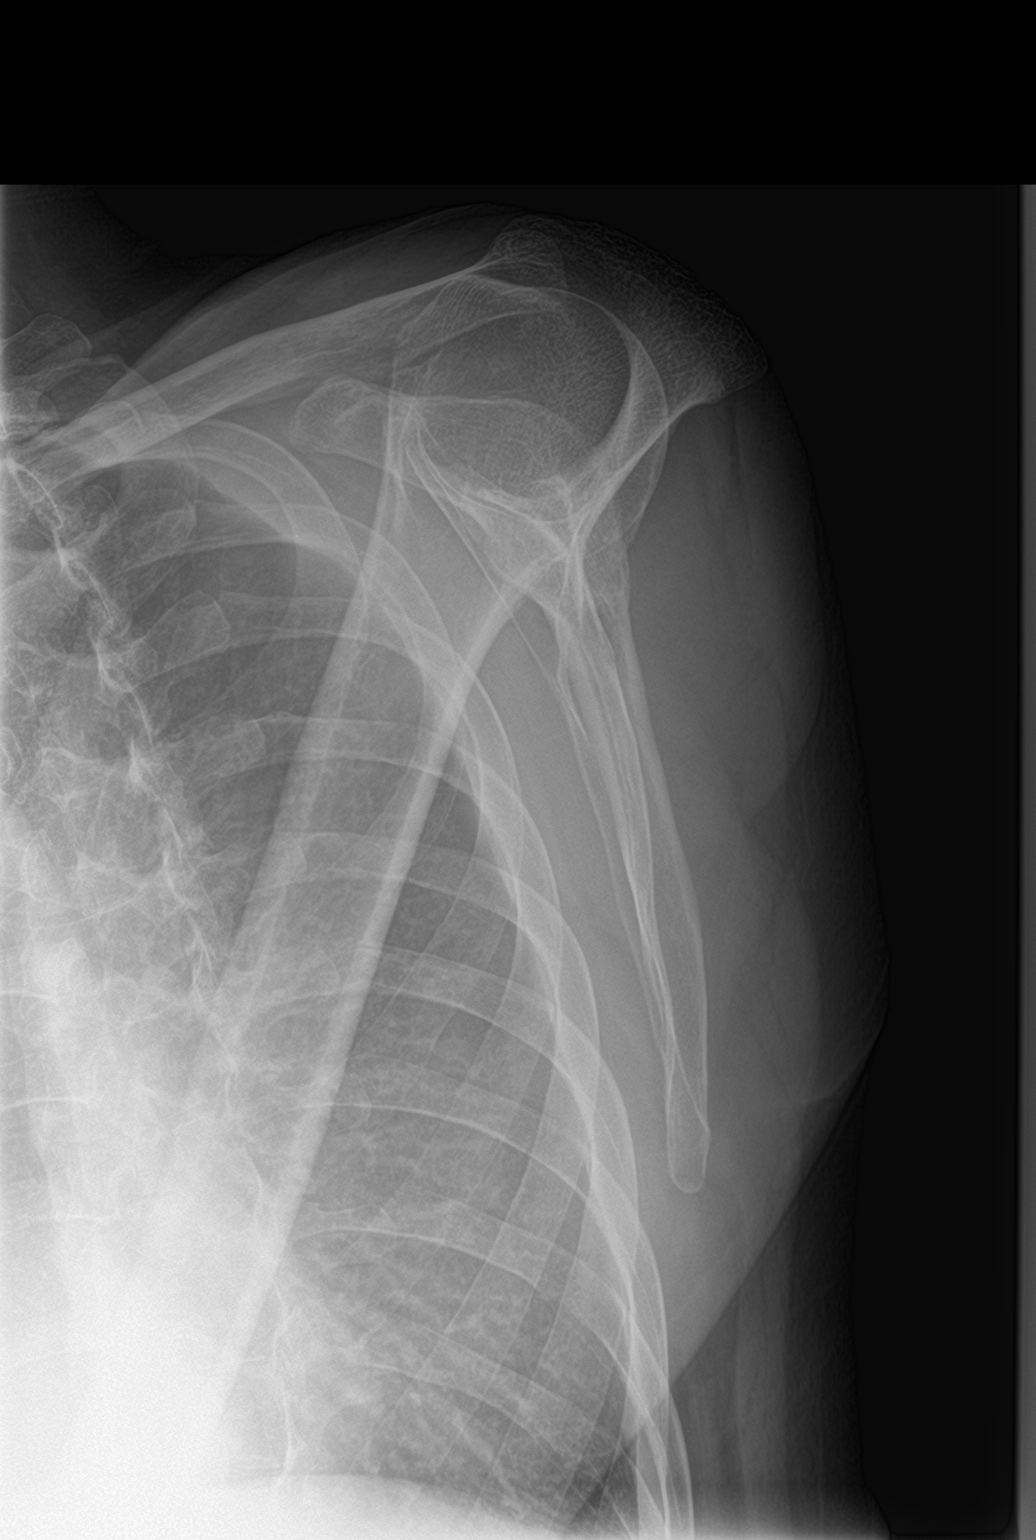

[shoulder axial]
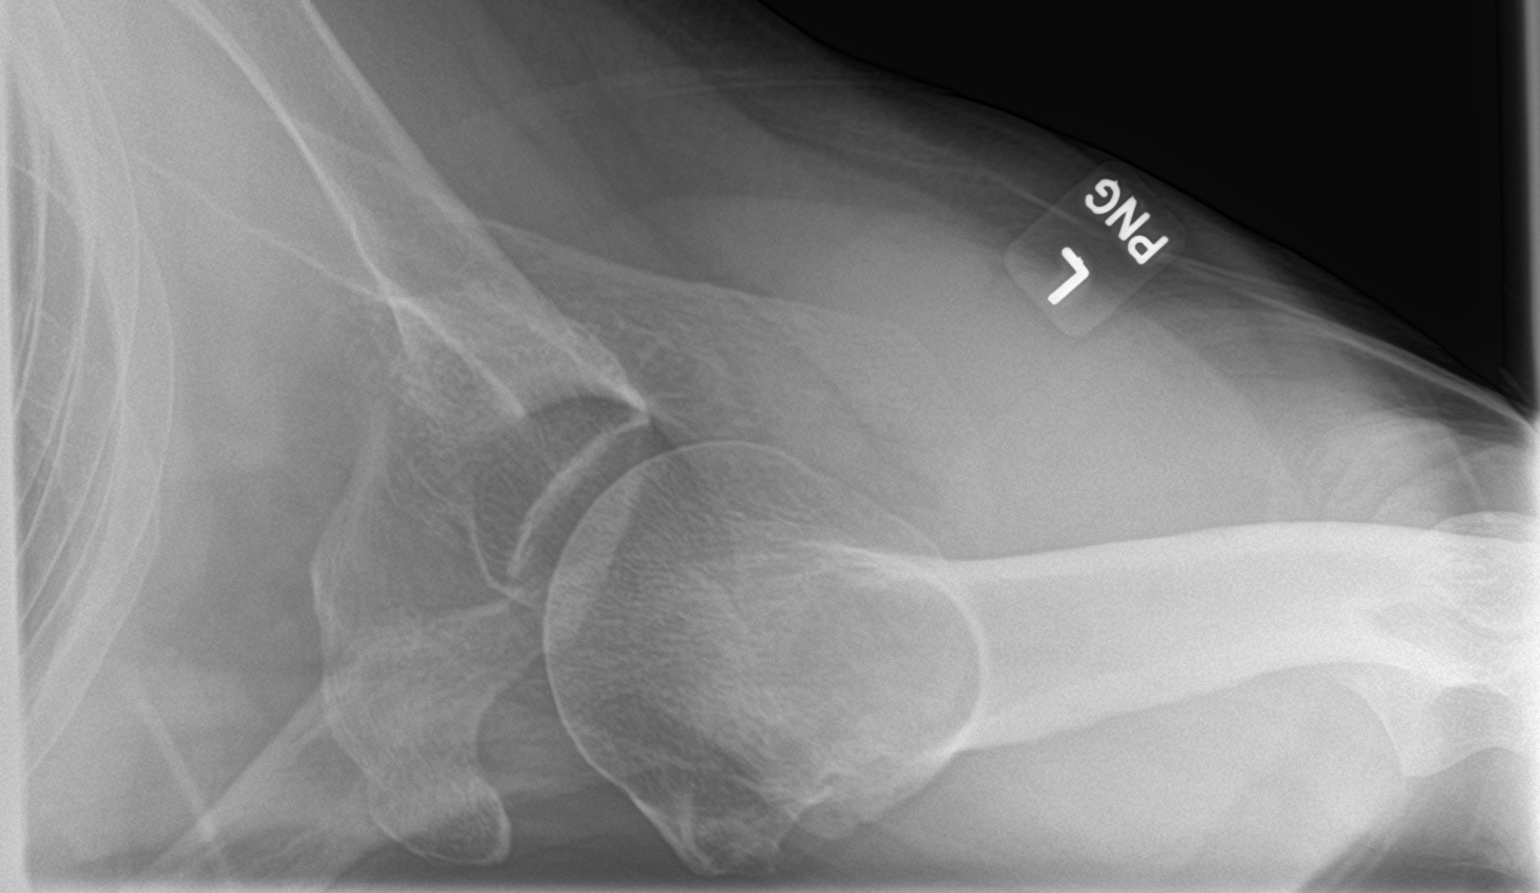

[3 of 3 positions shown; findings below may reference images not displayed]

FINDINGS: There is no evidence of fracture or dislocation. There is no
evidence of arthropathy or other focal bone abnormality. Soft
tissues are unremarkable.
IMPRESSION: Negative.

## 2021-07-18 ENCOUNTER — Ambulatory Visit: Payer: Medicare Other | Admitting: Gastroenterology

## 2021-07-18 ENCOUNTER — Encounter: Payer: Self-pay | Admitting: Gastroenterology

## 2021-07-18 VITALS — BP 110/60 | HR 72 | Ht 70.0 in | Wt 196.0 lb

## 2021-07-18 DIAGNOSIS — K219 Gastro-esophageal reflux disease without esophagitis: Secondary | ICD-10-CM | POA: Diagnosis not present

## 2021-07-18 DIAGNOSIS — Z86010 Personal history of colonic polyps: Secondary | ICD-10-CM | POA: Diagnosis not present

## 2021-07-18 DIAGNOSIS — K2270 Barrett's esophagus without dysplasia: Secondary | ICD-10-CM

## 2021-07-18 MED ORDER — FAMOTIDINE 40 MG PO TABS: 40.0000 mg | ORAL_TABLET | Freq: Every day | ORAL | 3 refills | Status: DC

## 2021-07-18 MED ORDER — PANTOPRAZOLE SODIUM 40 MG PO TBEC: 40.0000 mg | DELAYED_RELEASE_TABLET | Freq: Two times a day (BID) | ORAL | 3 refills | Status: DC

## 2021-07-18 NOTE — Progress Notes (Signed)
° ° °  History of Present Illness: This is a 71 year old male with short segment Barrett's esophagus without dysplasia.  He relates his reflux symptoms are under good control on his current regimen. Surveillance colonoscopy and EGD are due in July 2024.   Current Medications, Allergies, Past Medical History, Past Surgical History, Family History and Social History were reviewed in [employer] record.   Physical Exam: General: Well developed, well nourished, no acute distress Head: Normocephalic and atraumatic Eyes: Sclerae anicteric, EOMI Ears: Normal auditory acuity Mouth: Not examined, mask on during Covid-19 pandemic Lungs: Clear throughout to auscultation Heart: Regular rate and rhythm; no murmurs, rubs or bruits Abdomen: Soft, non tender and non distended. No masses, hepatosplenomegaly or hernias noted. Normal Bowel sounds Rectal: Not done Musculoskeletal: Symmetrical with no gross deformities  Pulses:  Normal pulses noted Extremities: No clubbing, cyanosis, edema or deformities noted Neurological: Alert oriented x 4, grossly nonfocal Psychological:  Alert and cooperative. Normal mood and affect   Assessment and Recommendations:  1. Barrett's short segment without dysplasia.  GERD.  S/P Nissen fundoplication. Continue pantoprazole 40 mg po bid and famotidine 40 mg po hs. Follow antireflux measures. Surveillance EGD in July 2024.  We discussed the long-term management of Barrett's and management options if dysplasia is found on future endoscopies.   2.  Personal history of adenomatous colon polyps.  Surveillance colonoscopy is recommended in July 2024.  I spent 15 minutes of face-to-face time with the patient. Greater than 50% of the time was spent counseling and coordinating care.

## 2021-07-18 NOTE — Patient Instructions (Signed)
We have sent the following prescriptions to your mail in pharmacy: pantoprazole and famotidine.    If you have not heard from your mail in pharmacy within 1 week or if you have not received your medication in the mail, please contact us at [phone] so we may find out why.  The [facility] providers would like to encourage you to use [facility] to communicate with providers for non-urgent requests or questions.  Due to long hold times on the telephone, sending your provider a message by [facility] may be a faster and more efficient way to get a response.  Please allow 48 business hours for a response.  Please remember that this is for non-urgent requests.   Thank you for choosing me and [facility].  Pricilla Riffle. Dagoberto Ligas., MD., Marval Regal

## 2021-08-02 DIAGNOSIS — L820 Inflamed seborrheic keratosis: Secondary | ICD-10-CM | POA: Diagnosis not present

## 2021-08-02 DIAGNOSIS — L918 Other hypertrophic disorders of the skin: Secondary | ICD-10-CM | POA: Diagnosis not present

## 2021-09-06 ENCOUNTER — Encounter: Payer: Self-pay | Admitting: Internal Medicine

## 2021-09-06 ENCOUNTER — Encounter: Payer: Medicare Other | Admitting: Internal Medicine

## 2021-09-06 ENCOUNTER — Ambulatory Visit (INDEPENDENT_AMBULATORY_CARE_PROVIDER_SITE_OTHER): Payer: Medicare Other | Admitting: Internal Medicine

## 2021-09-06 ENCOUNTER — Other Ambulatory Visit: Payer: Self-pay

## 2021-09-06 VITALS — BP 126/82 | HR 96 | Temp 98.0°F | Resp 16 | Ht 70.0 in | Wt 197.0 lb

## 2021-09-06 DIAGNOSIS — N400 Enlarged prostate without lower urinary tract symptoms: Secondary | ICD-10-CM | POA: Diagnosis not present

## 2021-09-06 DIAGNOSIS — E785 Hyperlipidemia, unspecified: Secondary | ICD-10-CM

## 2021-09-06 DIAGNOSIS — N138 Other obstructive and reflux uropathy: Secondary | ICD-10-CM | POA: Diagnosis not present

## 2021-09-06 DIAGNOSIS — Z0000 Encounter for general adult medical examination without abnormal findings: Secondary | ICD-10-CM | POA: Diagnosis not present

## 2021-09-06 DIAGNOSIS — N5201 Erectile dysfunction due to arterial insufficiency: Secondary | ICD-10-CM | POA: Diagnosis not present

## 2021-09-06 DIAGNOSIS — K219 Gastro-esophageal reflux disease without esophagitis: Secondary | ICD-10-CM

## 2021-09-06 DIAGNOSIS — N401 Enlarged prostate with lower urinary tract symptoms: Secondary | ICD-10-CM

## 2021-09-06 LAB — LIPID PANEL
Cholesterol: 179 mg/dL (ref 0–200)
HDL: 54.6 mg/dL (ref >39.00)
LDL Cholesterol: 97 mg/dL (ref 0–99)
NonHDL: 124.73
Total CHOL/HDL Ratio: 3
Triglycerides: 140 mg/dL (ref 0.0–149.0)
VLDL: 28 mg/dL (ref 0.0–40.0)

## 2021-09-06 LAB — CBC WITH DIFFERENTIAL/PLATELET
Basophils Absolute: 0.1 10*3/uL (ref 0.0–0.1)
Basophils Relative: 1 % (ref 0.0–3.0)
Eosinophils Absolute: 0.2 10*3/uL (ref 0.0–0.7)
Eosinophils Relative: 2.4 % (ref 0.0–5.0)
HCT: 48.4 % (ref 39.0–52.0)
Hemoglobin: 16.5 g/dL (ref 13.0–17.0)
Lymphocytes Relative: 28.1 % (ref 12.0–46.0)
Lymphs Abs: 1.9 10*3/uL (ref 0.7–4.0)
MCHC: 34.1 g/dL (ref 30.0–36.0)
MCV: 95.4 fl (ref 78.0–100.0)
Monocytes Absolute: 0.8 10*3/uL (ref 0.1–1.0)
Monocytes Relative: 12.6 % — ABNORMAL HIGH (ref 3.0–12.0)
Neutro Abs: 3.7 10*3/uL (ref 1.4–7.7)
Neutrophils Relative %: 55.9 % (ref 43.0–77.0)
Platelets: 277 10*3/uL (ref 150.0–400.0)
RBC: 5.07 Mil/uL (ref 4.22–5.81)
RDW: 13 % (ref 11.5–15.5)
WBC: 6.6 10*3/uL (ref 4.0–10.5)

## 2021-09-06 LAB — HEPATIC FUNCTION PANEL
ALT: 15 U/L (ref 0–53)
AST: 16 U/L (ref 0–37)
Albumin: 4.6 g/dL (ref 3.5–5.2)
Alkaline Phosphatase: 90 U/L (ref 39–117)
Bilirubin, Direct: 0.1 mg/dL (ref 0.0–0.3)
Total Bilirubin: 0.8 mg/dL (ref 0.2–1.2)
Total Protein: 7 g/dL (ref 6.0–8.3)

## 2021-09-06 LAB — PSA: PSA: 0.48 ng/mL (ref 0.10–4.00)

## 2021-09-06 MED ORDER — SILDENAFIL CITRATE 20 MG PO TABS: 60.0000 mg | ORAL_TABLET | Freq: Every day | ORAL | 5 refills | Status: DC | PRN

## 2021-09-06 MED ORDER — SILDENAFIL CITRATE 20 MG PO TABS: 80.0000 mg | ORAL_TABLET | Freq: Every day | ORAL | 5 refills | Status: DC | PRN

## 2021-09-06 NOTE — Patient Instructions (Signed)

## 2021-09-06 NOTE — Progress Notes (Signed)
Subjective:  Patient ID: [name], male    DOB: August 08, 1950  Age: 71 y.o. MRN: [id number]  CC: Annual Exam and Hyperlipidemia  This visit occurred during the SARS-CoV-2 public health emergency.  Safety protocols were in place, including screening questions prior to the visit, additional usage of staff PPE, and extensive cleaning of exam room while observing appropriate contact time as indicated for disinfecting solutions.    HPI [name] presents for a CPX and f/up -   He is active and works out at [employer].  He can do 10 minutes on the elliptical and 15 minutes on the bike without experiencing chest pain, shortness of breath, diaphoresis, or fatigue.  He complains of erectile dysfunction.  Outpatient Medications Prior to Visit  Medication Sig Dispense Refill   famotidine (PEPCID) 40 MG tablet Take 1 tablet (40 mg total) by mouth at bedtime. 90 tablet 3   pantoprazole (PROTONIX) 40 MG tablet Take 1 tablet (40 mg total) by mouth 2 (two) times daily before a meal. 180 tablet 3   COVID-19 mRNA bivalent vaccine, Pfizer, (PFIZER COVID-19 VAC BIVALENT) injection Inject into the muscle. 0.3 mL 0   COVID-19 mRNA vaccine, Pfizer, 30 MCG/0.3ML injection Inject into the muscle. 0.3 mL 0   benzonatate (TESSALON) 200 MG capsule Take 1 capsule (200 mg total) by mouth 2 (two) times daily as needed for cough. 20 capsule 0   No facility-administered medications prior to visit.    ROS Review of Systems  Constitutional:  Positive for unexpected weight change (wt gain). Negative for diaphoresis and fatigue.  HENT: Negative.    Eyes: Negative.   Respiratory: Negative.  Negative for cough, chest tightness, shortness of breath and wheezing.   Cardiovascular:  Negative for chest pain, palpitations and leg swelling.  Gastrointestinal:  Negative for abdominal pain, constipation, diarrhea, nausea and vomiting.  Endocrine: Negative.   Genitourinary: Negative.  Negative for difficulty  urinating, penile swelling and scrotal swelling.  Musculoskeletal: Negative.  Negative for arthralgias and myalgias.  Skin: Negative.   Neurological: Negative.   Hematological: Negative.   Psychiatric/Behavioral: Negative.     Objective:  BP 126/82 (BP Location: Right Arm, Patient Position: Sitting, Cuff Size: Large)    Pulse 96    Temp 98 F (36.7 C) (Oral)    Resp 16    Ht '5\' 10"'$  (1.778 m)    Wt 197 lb (89.4 kg)    SpO2 98%    BMI 28.27 kg/m   BP Readings from Last 3 Encounters:  09/06/21 126/82  07/18/21 110/60  09/05/20 122/82    Wt Readings from Last 3 Encounters:  09/06/21 197 lb (89.4 kg)  07/18/21 196 lb (88.9 kg)  09/05/20 188 lb (85.3 kg)    Physical Exam Vitals reviewed.  HENT:     Nose: Nose normal.     Mouth/Throat:     Mouth: Mucous membranes are moist.  Eyes:     General: No scleral icterus.    Conjunctiva/sclera: Conjunctivae normal.  Cardiovascular:     Rate and Rhythm: Normal rate and regular rhythm.     Heart sounds: No murmur heard. Pulmonary:     Effort: Pulmonary effort is normal.     Breath sounds: No stridor. No wheezing, rhonchi or rales.  Abdominal:     General: Abdomen is flat.     Palpations: There is no mass.     Tenderness: There is no abdominal tenderness. There is no guarding.     Hernia:  No hernia is present. There is no hernia in the left inguinal area or right inguinal area.  Genitourinary:    Pubic Area: No rash.      Penis: Normal.      Testes: Normal.     Epididymis:     Right: Normal.     Left: Normal.     Prostate: Enlarged. Not tender and no nodules present.     Rectum: Normal. Guaiac result negative. No mass, tenderness, anal fissure, external hemorrhoid or internal hemorrhoid. Normal anal tone.  Musculoskeletal:        General: Normal range of motion.     Cervical back: Neck supple.     Right lower leg: No edema.  Lymphadenopathy:     Cervical: No cervical adenopathy.     Lower Body: No right inguinal adenopathy. No  left inguinal adenopathy.  Skin:    General: Skin is warm and dry.     Coloration: Skin is not pale.  Neurological:     General: No focal deficit present.     Mental Status: He is alert. Mental status is at baseline.  Psychiatric:        Mood and Affect: Mood normal.        Behavior: Behavior normal.    Lab Results  Component Value Date   WBC 6.6 09/06/2021   HGB 16.5 09/06/2021   HCT 48.4 09/06/2021   PLT 277.0 09/06/2021   GLUCOSE 59 (L) 09/05/2020   CHOL 179 09/06/2021   TRIG 140.0 09/06/2021   HDL 54.60 09/06/2021   LDLCALC 97 09/06/2021   ALT 15 09/06/2021   AST 16 09/06/2021   NA 138 09/05/2020   K 4.3 09/05/2020   CL 102 09/05/2020   CREATININE 1.21 09/05/2020   BUN 15 09/05/2020   CO2 28 09/05/2020   TSH 2.08 09/05/2020   PSA 0.48 09/06/2021   INR 1.1 (H) 03/20/2017   HGBA1C 4.9 10/08/2011    MR SHOULDER LEFT WO CONTRAST  Result Date: 04/26/2019 CLINICAL DATA:  Shoulder pain for 2 months. EXAM: MRI OF THE LEFT SHOULDER WITHOUT CONTRAST TECHNIQUE: Multiplanar, multisequence MR imaging of the shoulder was performed. No intravenous contrast was administered. COMPARISON:  Radiographs dated 01/06/2019. FINDINGS: Rotator cuff: There is slight degenerative changes of the distal supraspinatus tendon. The remainder of the rotator cuff appears normal. Muscles: No atrophy or abnormal signal of the muscles of the rotator cuff. Biceps long head:  Properly located and intact. Acromioclavicular Joint: Minimal AC joint arthropathy. Type 2 acromion. Minimal amount of fluid in the subacromial bursa, not felt to be significant. Glenohumeral Joint: Small glenohumeral joint effusion. No chondral defect. Labrum:  Normal. Bones:  Normal. Other: None IMPRESSION: 1. Slight degenerative changes of the distal supraspinatus tendon. 2. Small glenohumeral joint effusion which could represent synovitis. 3. Otherwise, normal exam. Electronically Signed   By: Lorriane Shire M.D.   On: 04/26/2019 08:42     Assessment & Plan:   Kaimen was seen today for annual exam and hyperlipidemia.  Diagnoses and all orders for this visit:  Gastroesophageal reflux disease without esophagitis- His symptoms are well controlled. -     CBC with Differential/Platelet; Future -     CBC with Differential/Platelet  Benign prostatic hyperplasia without lower urinary tract symptoms- His PSA is reassuring. -     PSA; Future -     PSA  Hyperlipidemia LDL goal <130- His ASCVD risk score is less than 15%.  Statin therapy is not indicated. -  Lipid panel; Future -     Hepatic function panel; Future -     Hepatic function panel -     Lipid panel  Routine general medical examination at a health care facility- Exam completed, labs reviewed, vaccines reviewed and updated, cancer screenings are up-to-date, patient education was given.  Erectile dysfunction due to arterial insufficiency- His labs are negative for secondary causes.  Will treat with a PDE 5 inhibitor. -     Testosterone Total,Free,Bio, Males; Future -     Discontinue: sildenafil (REVATIO) 20 MG tablet; Take 4 tablets (80 mg total) by mouth daily as needed. -     Testosterone Total,Free,Bio, Males -     sildenafil (REVATIO) 20 MG tablet; Take 3 tablets (60 mg total) by mouth daily as needed.   I have discontinued [facility]'s COVID-19 mRNA vaccine Therapist, music), benzonatate, [employer]. I have also changed his sildenafil. Additionally, I am having him maintain his famotidine and pantoprazole.  Meds ordered this encounter  Medications   DISCONTD: sildenafil (REVATIO) 20 MG tablet    Sig: Take 4 tablets (80 mg total) by mouth daily as needed.    Dispense:  60 tablet    Refill:  5   sildenafil (REVATIO) 20 MG tablet    Sig: Take 3 tablets (60 mg total) by mouth daily as needed.    Dispense:  50 tablet    Refill:  5     Follow-up: Return in about 6 months (around 03/09/2022).  Scarlette Calico, MD

## 2021-09-06 NOTE — Telephone Encounter (Signed)
Pt is requesting a PA. Pt has sent a Mychart message with all details ?

## 2021-09-07 LAB — TESTOSTERONE TOTAL,FREE,BIO, MALES
Albumin: 4.3 g/dL (ref 3.6–5.1)
Sex Hormone Binding: 44 nmol/L (ref 22–77)
Testosterone, Bioavailable: 97.4 ng/dL — ABNORMAL LOW (ref 110.0–575.0)
Testosterone, Free: 49.5 pg/mL (ref 46.0–224.0)
Testosterone: 469 ng/dL (ref 250–827)

## 2021-09-11 ENCOUNTER — Telehealth: Payer: Self-pay

## 2021-09-11 NOTE — Telephone Encounter (Signed)
Per CoverMyMeds PA is denied.  ?

## 2021-09-11 NOTE — Telephone Encounter (Signed)
Key: [id number] ?

## 2021-09-12 ENCOUNTER — Other Ambulatory Visit: Payer: Self-pay | Admitting: Internal Medicine

## 2021-09-12 DIAGNOSIS — N138 Other obstructive and reflux uropathy: Secondary | ICD-10-CM | POA: Insufficient documentation

## 2021-09-12 MED ORDER — TADALAFIL 5 MG PO TABS: 5.0000 mg | ORAL_TABLET | Freq: Every day | ORAL | 1 refills | Status: DC

## 2021-09-12 NOTE — Addendum Note (Signed)
Addended by: Janith Lima on: 09/12/2021 10:19 AM ? ? Modules accepted: Orders ? ?

## 2021-09-13 NOTE — Telephone Encounter (Signed)
LVM to discuss.  ? ?Inform pt PA was denied for sildenafil but Cialis has been rx'd in place of it by PCP ?

## 2022-03-01 DIAGNOSIS — H43813 Vitreous degeneration, bilateral: Secondary | ICD-10-CM | POA: Diagnosis not present

## 2022-03-01 DIAGNOSIS — H52203 Unspecified astigmatism, bilateral: Secondary | ICD-10-CM | POA: Diagnosis not present

## 2022-03-01 DIAGNOSIS — H524 Presbyopia: Secondary | ICD-10-CM | POA: Diagnosis not present

## 2022-03-01 DIAGNOSIS — H26491 Other secondary cataract, right eye: Secondary | ICD-10-CM | POA: Diagnosis not present

## 2022-03-26 DIAGNOSIS — H26491 Other secondary cataract, right eye: Secondary | ICD-10-CM | POA: Diagnosis not present

## 2022-04-24 ENCOUNTER — Other Ambulatory Visit (HOSPITAL_BASED_OUTPATIENT_CLINIC_OR_DEPARTMENT_OTHER): Payer: Self-pay

## 2022-04-24 MED ORDER — COMIRNATY 30 MCG/0.3ML IM SUSY
PREFILLED_SYRINGE | INTRAMUSCULAR | 0 refills | Status: DC
  Filled 2022-04-24: qty 0.3, 1d supply, fill #0

## 2022-04-24 MED ORDER — INFLUENZA VAC A&B SA ADJ QUAD 0.5 ML IM PRSY
PREFILLED_SYRINGE | INTRAMUSCULAR | 0 refills | Status: DC
  Filled 2022-04-24: qty 0.5, 1d supply, fill #0

## 2022-08-04 ENCOUNTER — Other Ambulatory Visit: Payer: Self-pay | Admitting: Gastroenterology

## 2022-10-16 ENCOUNTER — Other Ambulatory Visit: Payer: Self-pay | Admitting: Gastroenterology

## 2022-11-22 ENCOUNTER — Encounter: Payer: Self-pay | Admitting: Gastroenterology

## 2022-12-05 ENCOUNTER — Other Ambulatory Visit: Payer: Self-pay | Admitting: Gastroenterology

## 2022-12-11 ENCOUNTER — Encounter: Payer: Self-pay | Admitting: Gastroenterology

## 2023-01-08 ENCOUNTER — Ambulatory Visit (AMBULATORY_SURGERY_CENTER): Payer: Medicare Other

## 2023-01-08 ENCOUNTER — Encounter: Payer: Self-pay | Admitting: Gastroenterology

## 2023-01-08 VITALS — Ht 70.0 in | Wt 190.0 lb

## 2023-01-08 DIAGNOSIS — Z86010 Personal history of colonic polyps: Secondary | ICD-10-CM

## 2023-01-08 DIAGNOSIS — K2270 Barrett's esophagus without dysplasia: Secondary | ICD-10-CM

## 2023-01-08 MED ORDER — NA SULFATE-K SULFATE-MG SULF 17.5-3.13-1.6 GM/177ML PO SOLN: 1.0000 | Freq: Once | ORAL | 0 refills | Status: AC

## 2023-01-08 NOTE — Progress Notes (Signed)

## 2023-01-14 ENCOUNTER — Encounter: Payer: Medicare Other | Admitting: Gastroenterology

## 2023-01-28 ENCOUNTER — Ambulatory Visit (AMBULATORY_SURGERY_CENTER): Payer: Medicare Other | Admitting: Gastroenterology

## 2023-01-28 ENCOUNTER — Encounter: Payer: Self-pay | Admitting: Gastroenterology

## 2023-01-28 VITALS — BP 104/74 | HR 61 | Temp 97.5°F | Resp 9 | Ht 70.0 in | Wt 190.0 lb

## 2023-01-28 DIAGNOSIS — K2270 Barrett's esophagus without dysplasia: Secondary | ICD-10-CM

## 2023-01-28 DIAGNOSIS — D123 Benign neoplasm of transverse colon: Secondary | ICD-10-CM

## 2023-01-28 DIAGNOSIS — K2100 Gastro-esophageal reflux disease with esophagitis, without bleeding: Secondary | ICD-10-CM

## 2023-01-28 DIAGNOSIS — Z09 Encounter for follow-up examination after completed treatment for conditions other than malignant neoplasm: Secondary | ICD-10-CM

## 2023-01-28 DIAGNOSIS — Z86010 Personal history of colonic polyps: Secondary | ICD-10-CM

## 2023-01-28 DIAGNOSIS — D120 Benign neoplasm of cecum: Secondary | ICD-10-CM

## 2023-01-28 DIAGNOSIS — K317 Polyp of stomach and duodenum: Secondary | ICD-10-CM

## 2023-01-28 MED ORDER — SODIUM CHLORIDE 0.9 % IV SOLN: 500.0000 mL | Freq: Once | INTRAVENOUS | Status: DC

## 2023-01-28 NOTE — Op Note (Signed)
[facility] Patient Name: [name] Procedure Date: 01/28/2023 2:31 PM MRN: [id number] Endoscopist: Meryl Dare , MD, [phone] Age: 72 Referring MD:  Date of Birth: [birth year] Gender: Male Account #: [id number] Procedure:                Colonoscopy Indications:              Surveillance: Personal history of adenomatous                            polyps on last colonoscopy 5 years ago Medicines:                Monitored Anesthesia Care Procedure:                Pre-Anesthesia Assessment:                           - Prior to the procedure, a History and Physical                            was performed, and patient medications and                            allergies were reviewed. The patient's tolerance of                            previous anesthesia was also reviewed. The risks                            and benefits of the procedure and the sedation                            options and risks were discussed with the patient.                            All questions were answered, and informed consent                            was obtained. Prior Anticoagulants: The patient has                            taken no anticoagulant or antiplatelet agents. ASA                            Grade Assessment: II - A patient with mild systemic                            disease. After reviewing the risks and benefits,                            the patient was deemed in satisfactory condition to                            undergo the procedure.  After obtaining informed consent, the colonoscope                            was passed under direct vision. Throughout the                            procedure, the patient's blood pressure, pulse, and                            oxygen saturations were monitored continuously. The                            CF [id number] #[id number] was introduced through the anus                            and advanced to the  the cecum, identified by                            appendiceal orifice and ileocecal valve. The                            ileocecal valve, appendiceal orifice, and rectum                            were photographed. The quality of the bowel                            preparation was adequate after extensive lavage,                            suction. The colonoscopy was performed without                            difficulty. The patient tolerated the procedure                            well. Scope In: 2:39:30 PM Scope Out: 2:59:44 PM Scope Withdrawal Time: 0 hours 16 minutes 14 seconds  Total Procedure Duration: 0 hours 20 minutes 14 seconds  Findings:                 The perianal and digital rectal examinations were                            normal.                           A 3 mm polyp was found in the transverse colon. The                            polyp was sessile. The polyp was removed with a                            cold biopsy forceps. Resection and retrieval were  complete.                           Four sessile polyps were found in the hepatic                            flexure and cecum. The polyps were 6 to 8 mm in                            size. These polyps were removed with a cold snare.                            Resection and retrieval were complete. Persistent                            oozing at one cecal polypectomy site. For                            hemostasis, one hemostatic clip was successfully                            placed (MR conditional). Clip manufacturer: [employer].                            There was no bleeding at the end of the maneuver.                           Multiple medium-mouthed and small-mouthed                            diverticula were found in the right colon. There                            was no evidence of diverticular bleeding.                           Multiple medium-mouthed and small-mouthed                             diverticula were found in the left colon. There was                            narrowing of the colon in association with the                            diverticular opening. There was evidence of                            diverticular spasm. Peri-diverticular erythema was                            seen. There was no evidence of diverticular  bleeding.                           The exam was otherwise without abnormality on                            direct and retroflexion views. Complications:            No immediate complications. Estimated blood loss:                            None. Estimated Blood Loss:     Estimated blood loss: none. Impression:               - One 3 mm polyp in the transverse colon, removed                            with a cold biopsy forceps. Resected and retrieved.                           - Four 6 to 8 mm polyps at the hepatic flexure and                            in the cecum, removed with a cold snare. Resected                            and retrieved. Clip (MR conditional) was placed.                            Clip manufacturer: [employer].                           - Moderate diverticulosis in the right colon.                           - Severe diverticulosis in the left colon.                           - The examination was otherwise normal on direct                            and retroflexion views. Recommendation:           - Repeat colonoscopy, likely 3 years, after studies                            are complete for surveillance based on pathology                            results with a more extensive bowel prep.                           - Patient has a contact number available for                            emergencies. The  signs and symptoms of potential                            delayed complications were discussed with the                            patient. Return to normal activities  tomorrow.                            Written discharge instructions were provided to the                            patient.                           - High fiber diet.                           - Continue present medications.                           - Await pathology results.                           - No aspirin, ibuprofen, naproxen, or other                            non-steroidal anti-inflammatory drugs for 2 weeks                            after polyp removal. Meryl Dare, MD 01/28/2023 3:17:02 PM This report has been signed electronically.

## 2023-01-28 NOTE — Progress Notes (Signed)
History & Physical  Primary Care Physician:  Etta Grandchild, MD Primary Gastroenterologist: Claudette Head, MD  Impression / Plan:  Personal history of adenomatous colon polyps and Barrett's esophagus for colonoscopy and EGD.  CHIEF COMPLAINT: Barrett's esophagus, Personal history of colon polyps   HPI: [name] is a 72 y.o. male with a personal history of adenomatous colon polyps and Barrett's esophagus for colonoscopy and EGD.    Past Medical History:  Diagnosis Date   Barrett's esophagus 07/2012   Blood transfusion without reported diagnosis 2012   Cataract    Diverticulosis    Erosive esophagitis    GERD (gastroesophageal reflux disease)    GI bleed    S/P laparoscopic cholecystectomy for gangrenous GB complicated by bile leak 06/07/2011   Tubular adenoma of colon 07/2012    Past Surgical History:  Procedure Laterality Date   CHOLECYSTECTOMY     COLONOSCOPY     ESOPHAGOGASTRODUODENOSCOPY  10/08/2011   Procedure: ESOPHAGOGASTRODUODENOSCOPY (EGD);  Surgeon: Meryl Dare, MD,FACG;  Location: [facility];  Service: Endoscopy;  Laterality: N/A;   HERNIA REPAIR     NISSEN FUNDOPLICATION      Prior to Admission medications   Medication Sig Start Date End Date Taking? Authorizing Provider  famotidine (PEPCID) 40 MG tablet Take 1 tablet (40 mg total) by mouth at bedtime. Please call [phone] to schedule an office visit for more refills 12/06/22  Yes Meryl Dare, MD  pantoprazole (PROTONIX) 40 MG tablet Take 1 tablet (40 mg total) by mouth 2 (two) times daily before a meal. Please call [phone] to schedule an office visit for more refills 12/06/22  Yes Meryl Dare, MD  tadalafil (CIALIS) 5 MG tablet Take 1 tablet (5 mg total) by mouth daily. Patient not taking: Reported on 01/08/2023 09/12/21   Etta Grandchild, MD  COVID-19 mRNA vaccine [phone] (COMIRNATY) syringe Inject into the muscle. Patient not taking: Reported on 01/08/2023 04/24/22   Judyann Munson, MD  influenza vaccine adjuvanted (FLUAD) 0.5 ML injection Inject into the muscle. Patient not taking: Reported on 01/08/2023 04/24/22   Judyann Munson, MD  sildenafil (REVATIO) 20 MG tablet Take 3 tablets (60 mg total) by mouth daily as needed. Patient not taking: Reported on 01/08/2023 09/06/21   Etta Grandchild, MD    Current Outpatient Medications  Medication Sig Dispense Refill   famotidine (PEPCID) 40 MG tablet Take 1 tablet (40 mg total) by mouth at bedtime. Please call [phone] to schedule an office visit for more refills 100 tablet 0   pantoprazole (PROTONIX) 40 MG tablet Take 1 tablet (40 mg total) by mouth 2 (two) times daily before a meal. Please call [phone] to schedule an office visit for more refills 200 tablet 0   tadalafil (CIALIS) 5 MG tablet Take 1 tablet (5 mg total) by mouth daily. (Patient not taking: Reported on 01/08/2023) 90 tablet 1   COVID-19 mRNA vaccine 2023-2024 (COMIRNATY) syringe Inject into the muscle. (Patient not taking: Reported on 01/08/2023) 0.3 mL 0   influenza vaccine adjuvanted (FLUAD) 0.5 ML injection Inject into the muscle. (Patient not taking: Reported on 01/08/2023) 0.5 mL 0   sildenafil (REVATIO) 20 MG tablet Take 3 tablets (60 mg total) by mouth daily as needed. (Patient not taking: Reported on 01/08/2023) 50 tablet 5   Current Facility-Administered Medications  Medication Dose Route Frequency Provider Last Rate Last Admin   0.9 %  sodium chloride infusion  500 mL Intravenous Once Meryl Dare, MD  Allergies as of 01/28/2023   (No Known Allergies)    Family History  Problem Relation Age of Onset   Lung cancer Mother    Heart disease Mother    Pancreatitis Father    Breast cancer Maternal Aunt    Stomach cancer Paternal Uncle    Esophageal cancer Paternal Uncle    Drug abuse Son    Early death Son 68       cocaine OD   Colon cancer Neg Hx    Rectal cancer Neg Hx    Colon polyps Neg Hx     Social History    Socioeconomic History   Marital status: Married    Spouse name: Not on file   Number of children: Not on file   Years of education: Not on file   Highest education level: Not on file  Occupational History   Not on file  Tobacco Use   Smoking status: Never   Smokeless tobacco: Never  Vaping Use   Vaping status: Never Used  Substance and Sexual Activity   Alcohol use: Yes    Comment: Occasional   Drug use: No   Sexual activity: Yes  Other Topics Concern   Not on file  Social History Narrative   Not on file   Social Determinants of Health   Financial Resource Strain: Not on file  Food Insecurity: Not on file  Transportation Needs: Not on file  Physical Activity: Not on file  Stress: Not on file  Social Connections: Not on file  Intimate Partner Violence: Not on file    Review of Systems:  All systems reviewed were negative except where noted in HPI.   Physical Exam:  General:  Alert, well-developed, in NAD Head:  Normocephalic and atraumatic. Eyes:  Sclera clear, no icterus.   Conjunctiva pink. Ears:  Normal auditory acuity. Mouth:  No deformity or lesions.  Neck:  Supple; no masses. Lungs:  Clear throughout to auscultation.   No wheezes, crackles, or rhonchi.  Heart:  Regular rate and rhythm; no murmurs. Abdomen:  Soft, nondistended, nontender. No masses, hepatomegaly. No palpable masses.  Normal bowel sounds.    Rectal:  Deferred   Msk:  Symmetrical without gross deformities. Extremities:  Without edema. Neurologic:  Alert and  oriented x 4; grossly normal neurologically. Skin:  Intact without significant lesions or rashes. Psych:  Alert and cooperative. Normal mood and affect.   Venita Lick. Russella Dar  01/28/2023, 2:31 PM See Loretha Stapler, [facility], to contact our on call provider

## 2023-01-28 NOTE — Progress Notes (Signed)
Report to PACU, RN, vss, BBS= Clear.  

## 2023-01-28 NOTE — Progress Notes (Signed)
VS completed by EP   Pt's states no medical or surgical changes since previsit or office visit.

## 2023-01-28 NOTE — Op Note (Signed)
[facility] Patient Name: [name] Procedure Date: 01/28/2023 2:27 PM MRN: [id number] Endoscopist: Meryl Dare , MD, [phone] Age: 72 Referring MD:  Date of Birth: 03-15-1951 Gender: Male Account #: [id number] Procedure:                Upper GI endoscopy Indications:              Surveillance for malignancy due to personal history                            of Barrett's esophagus Medicines:                Monitored Anesthesia Care Procedure:                Pre-Anesthesia Assessment:                           - Prior to the procedure, a History and Physical                            was performed, and patient medications and                            allergies were reviewed. The patient's tolerance of                            previous anesthesia was also reviewed. The risks                            and benefits of the procedure and the sedation                            options and risks were discussed with the patient.                            All questions were answered, and informed consent                            was obtained. Prior Anticoagulants: The patient has                            taken no anticoagulant or antiplatelet agents. ASA                            Grade Assessment: II - A patient with mild systemic                            disease. After reviewing the risks and benefits,                            the patient was deemed in satisfactory condition to                            undergo the procedure.  After obtaining informed consent, the endoscope was                            passed under direct vision. Throughout the                            procedure, the patient's blood pressure, pulse, and                            oxygen saturations were monitored continuously. The                            Olympus Scope [id number] was introduced through the                            mouth, and advanced to  the second part of duodenum.                            The upper GI endoscopy was accomplished without                            difficulty. The patient tolerated the procedure                            well. Scope In: Scope Out: Findings:                 There were esophageal mucosal changes secondary to                            established short-segment Barrett's disease present                            in the distal esophagus. The maximum longitudinal                            extent of these mucosal changes was 3 cm in length.                            Mucosa was biopsied with a cold forceps for                            histology. One specimen bottle was sent to                            pathology.                           LA Grade B (one or more mucosal breaks greater than                            5 mm, not extending between the tops of two mucosal  folds) esophagitis with no bleeding was found in                            the distal esophagus.                           The exam of the esophagus was otherwise normal.                           A medium-sized hiatal hernia was present.                           Multiple 4 to 7 mm sessile polyps with no bleeding                            and no stigmata of recent bleeding were found in                            the gastric fundus and in the gastric body.                            Previously biopsied.                           The exam of the stomach was otherwise normal.                           The duodenal bulb and second portion of the                            duodenum were normal. Complications:            No immediate complications. Estimated Blood Loss:     Estimated blood loss was minimal. Impression:               - Esophageal mucosal changes secondary to                            established short-segment Barrett's disease.                            Biopsied.                            - LA Grade B reflux esophagitis with no bleeding.                           - Medium-sized hiatal hernia.                           - Multiple gastric polyps.                           - Normal duodenal bulb and second portion of the  duodenum. Recommendation:           - Patient has a contact number available for                            emergencies. The signs and symptoms of potential                            delayed complications were discussed with the                            patient. Return to normal activities tomorrow.                            Written discharge instructions were provided to the                            patient.                           - Resume previous diet.                           - Follow antireflux measures long term.                           - Continue present medications.                           - Await pathology results.                           - Repeat upper endoscopy in 3 years for                            surveillance of Barrett's esophagus. Meryl Dare, MD 01/28/2023 3:30:26 PM This report has been signed electronically.

## 2023-01-28 NOTE — Patient Instructions (Signed)
Discharge instructions given. Handouts on polyps,diverticulosis and Hiatal Hernia. Resume previous medications. No aspirin,ibuprofen,naproxen,or other non-steroidal anti-inflammatory drugs for 2 weeks. YOU HAD AN ENDOSCOPIC PROCEDURE TODAY AT THE  ENDOSCOPY CENTER:   Refer to the procedure report that was given to you for any specific questions about what was found during the examination.  If the procedure report does not answer your questions, please call your gastroenterologist to clarify.  If you requested that your care partner not be given the details of your procedure findings, then the procedure report has been included in a sealed envelope for you to review at your convenience later.  YOU SHOULD EXPECT: Some feelings of bloating in the abdomen. Passage of more gas than usual.  Walking can help get rid of the air that was put into your GI tract during the procedure and reduce the bloating. If you had a lower endoscopy (such as a colonoscopy or flexible sigmoidoscopy) you may notice spotting of blood in your stool or on the toilet paper. If you underwent a bowel prep for your procedure, you may not have a normal bowel movement for a few days.  Please Note:  You might notice some irritation and congestion in your nose or some drainage.  This is from the oxygen used during your procedure.  There is no need for concern and it should clear up in a day or so.  SYMPTOMS TO REPORT IMMEDIATELY:  Following lower endoscopy (colonoscopy or flexible sigmoidoscopy):  Excessive amounts of blood in the stool  Significant tenderness or worsening of abdominal pains  Swelling of the abdomen that is new, acute  Fever of 100F or higher  Following upper endoscopy (EGD)  Vomiting of blood or coffee ground material  New chest pain or pain under the shoulder blades  Painful or persistently difficult swallowing  New shortness of breath  Fever of 100F or higher  Black, tarry-looking stools  For urgent  or emergent issues, a gastroenterologist can be reached at any hour by calling [phone]. Do not use MyChart messaging for urgent concerns.    DIET:  We do recommend a small meal at first, but then you may proceed to your regular diet.  Drink plenty of fluids but you should avoid alcoholic beverages for 24 hours.  ACTIVITY:  You should plan to take it easy for the rest of today and you should NOT DRIVE or use heavy machinery until tomorrow (because of the sedation medicines used during the test).    FOLLOW UP: Our staff will call the number listed on your records the next business day following your procedure.  We will call around 7:15- 8:00 am to check on you and address any questions or concerns that you may have regarding the information given to you following your procedure. If we do not reach you, we will leave a message.     If any biopsies were taken you will be contacted by phone or by letter within the next 1-3 weeks.  Please call us at [phone] if you have not heard about the biopsies in 3 weeks.    SIGNATURES/CONFIDENTIALITY: You and/or your care partner have signed paperwork which will be entered into your electronic medical record.  These signatures attest to the fact that that the information above on your After Visit Summary has been reviewed and is understood.  Full responsibility of the confidentiality of this discharge information lies with you and/or your care-partner.

## 2023-01-28 NOTE — Progress Notes (Signed)
Called to room to assist during endoscopic procedure.  Patient ID and intended procedure confirmed with present staff. Received instructions for my participation in the procedure from the performing physician.  

## 2023-01-29 ENCOUNTER — Telehealth: Payer: Self-pay

## 2023-01-29 NOTE — Telephone Encounter (Signed)
  Follow up Call-     01/28/2023    1:51 PM  Call back number  Post procedure Call Back phone  # [phone]  Permission to leave phone message Yes     Patient questions:  Do you have a fever, pain , or abdominal swelling? No. Pain Score  0 *  Have you tolerated food without any problems? Yes.    Have you been able to return to your normal activities? Yes.    Do you have any questions about your discharge instructions: Diet   No. Medications  No. Follow up visit  No.  Do you have questions or concerns about your Care? No.  Actions: * If pain score is 4 or above: No action needed, pain <4.

## 2023-02-13 ENCOUNTER — Other Ambulatory Visit: Payer: Self-pay | Admitting: Gastroenterology

## 2023-02-14 ENCOUNTER — Other Ambulatory Visit: Payer: Self-pay | Admitting: Gastroenterology

## 2023-02-14 ENCOUNTER — Encounter: Payer: Self-pay | Admitting: Gastroenterology

## 2023-02-18 ENCOUNTER — Ambulatory Visit: Payer: Medicare Other

## 2023-04-02 ENCOUNTER — Encounter: Payer: Self-pay | Admitting: Gastroenterology

## 2023-04-03 ENCOUNTER — Ambulatory Visit: Payer: Medicare Other

## 2023-04-14 ENCOUNTER — Ambulatory Visit (INDEPENDENT_AMBULATORY_CARE_PROVIDER_SITE_OTHER): Payer: Medicare Other

## 2023-04-14 VITALS — Ht 70.0 in | Wt 190.0 lb

## 2023-04-14 DIAGNOSIS — Z0000 Encounter for general adult medical examination without abnormal findings: Secondary | ICD-10-CM | POA: Diagnosis not present

## 2023-04-14 NOTE — Patient Instructions (Signed)
[name] , Thank you for taking time to come for your Medicare Wellness Visit. I appreciate your ongoing commitment to your health goals. Please review the following plan we discussed and let me know if I can assist you in the future.   Referrals/Orders/Follow-Ups/Clinician Recommendations: NO  This is a list of the screening recommended for you and due dates:  Health Maintenance  Topic Date Due   Flu Shot  01/30/2023   COVID-19 Vaccine (8 - 2023-24 season) 03/02/2023   Medicare Annual Wellness Visit  04/13/2024   DTaP/Tdap/Td vaccine (2 - Td or Tdap) 04/17/2025   Colon Cancer Screening  01/27/2026   Pneumonia Vaccine  Completed   Hepatitis C Screening  Completed   Zoster (Shingles) Vaccine  Completed   HPV Vaccine  Aged Out    Advanced directives: (Declined) Advance directive discussed with you today. Even though you declined this today, please call our office should you change your mind, and we can give you the proper paperwork for you to fill out.  Next Medicare Annual Wellness Visit scheduled for next year: Yes

## 2023-04-14 NOTE — Progress Notes (Signed)
Subjective:   [name] is a 72 y.o. male who presents for an Initial Medicare Annual Wellness Visit.  Visit Complete: Virtual I connected with  [name] on 04/14/23 by a audio enabled telemedicine application and verified that I am speaking with the correct person using two identifiers.  Patient Location: Home  Provider Location: Office/Clinic  I discussed the limitations of evaluation and management by telemedicine. The patient expressed understanding and agreed to proceed.  Vital Signs: Because this visit was a virtual/telehealth visit, some criteria may be missing or patient reported. Any vitals not documented were not able to be obtained and vitals that have been documented are patient reported.  Cardiac Risk Factors include: advanced age (>74men, >52 women);sedentary lifestyle;dyslipidemia;family history of premature cardiovascular disease;male gender     Objective:    Today's Vitals   04/14/23 1503  Weight: 190 lb (86.2 kg)  Height: 5\' 10"  (1.778 m)  PainSc: 0-No pain   Body mass index is 27.26 kg/m.     04/14/2023    3:05 PM 05/13/2016    9:27 AM 09/27/2014    9:19 PM 10/08/2011    5:04 PM 10/08/2011    2:51 PM  Advanced Directives  Does Patient Have a Medical Advance Directive? No No No Patient does not have advance directive;Patient would not like information Patient does not have advance directive  Would patient like information on creating a medical advance directive? No - Patient declined No - patient declined information No - patient declined information      Current Medications (verified) Outpatient Encounter Medications as of 04/14/2023  Medication Sig   famotidine (PEPCID) 40 MG tablet TAKE 1 TABLET BY MOUTH AT  BEDTIME   pantoprazole (PROTONIX) 40 MG tablet TAKE 1 TABLET BY MOUTH TWICE  DAILY BEFORE A MEAL   COVID-19 mRNA vaccine 2023-2024 (COMIRNATY) syringe Inject into the muscle. (Patient not taking: Reported on 01/08/2023)    influenza vaccine adjuvanted (FLUAD) 0.5 ML injection Inject into the muscle. (Patient not taking: Reported on 01/08/2023)   sildenafil (REVATIO) 20 MG tablet Take 3 tablets (60 mg total) by mouth daily as needed. (Patient not taking: Reported on 01/08/2023)   tadalafil (CIALIS) 5 MG tablet Take 1 tablet (5 mg total) by mouth daily. (Patient not taking: Reported on 01/08/2023)   Facility-Administered Encounter Medications as of 04/14/2023  Medication   0.9 %  sodium chloride infusion    Allergies (verified) Patient has no known allergies.   History: Past Medical History:  Diagnosis Date   Barrett's esophagus 07/2012   Blood transfusion without reported diagnosis 2012   Cataract    Diverticulosis    Erosive esophagitis    GERD (gastroesophageal reflux disease)    GI bleed    S/P laparoscopic cholecystectomy for gangrenous GB complicated by bile leak 06/07/2011   Tubular adenoma of colon 07/2012   Past Surgical History:  Procedure Laterality Date   CHOLECYSTECTOMY     COLONOSCOPY     ESOPHAGOGASTRODUODENOSCOPY  10/08/2011   Procedure: ESOPHAGOGASTRODUODENOSCOPY (EGD);  Surgeon: Meryl Dare, MD,FACG;  Location: [facility];  Service: Endoscopy;  Laterality: N/A;   HERNIA REPAIR     NISSEN FUNDOPLICATION     Family History  Problem Relation Age of Onset   Lung cancer Mother    Heart disease Mother    Pancreatitis Father    Breast cancer Maternal Aunt    Stomach cancer Paternal Uncle    Esophageal cancer Paternal Uncle    Drug abuse Son  Early death Son 21       cocaine OD   Colon cancer Neg Hx    Rectal cancer Neg Hx    Colon polyps Neg Hx    Social History   Socioeconomic History   Marital status: Married    Spouse name: Not on file   Number of children: Not on file   Years of education: Not on file   Highest education level: Not on file  Occupational History   Not on file  Tobacco Use   Smoking status: Never   Smokeless tobacco: Never  Vaping Use   Vaping  status: Never Used  Substance and Sexual Activity   Alcohol use: Yes    Comment: Occasional   Drug use: No   Sexual activity: Yes  Other Topics Concern   Not on file  Social History Narrative   Not on file   Social Determinants of Health   Financial Resource Strain: Low Risk  (04/14/2023)   Overall Financial Resource Strain (CARDIA)    Difficulty of Paying Living Expenses: Not hard at all  Food Insecurity: No Food Insecurity (04/14/2023)   Hunger Vital Sign    Worried About Running Out of Food in the Last Year: Never true    Ran Out of Food in the Last Year: Never true  Transportation Needs: No Transportation Needs (04/14/2023)   PRAPARE - Administrator, Civil Service (Medical): No    Lack of Transportation (Non-Medical): No  Physical Activity: Inactive (04/14/2023)   Exercise Vital Sign    Days of Exercise per Week: 0 days    Minutes of Exercise per Session: 0 min  Stress: No Stress Concern Present (04/14/2023)   [employer] of Occupational Health - Occupational Stress Questionnaire    Feeling of Stress : Not at all  Social Connections: Unknown (04/14/2023)   Social Connection and Isolation Panel [NHANES]    Frequency of Communication with Friends and Family: More than three times a week    Frequency of Social Gatherings with Friends and Family: More than three times a week    Attends Religious Services: Not on Marketing executive or Organizations: Yes    Attends Engineer, structural: More than 4 times per year    Marital Status: Married    Tobacco Counseling Counseling given: Not Answered   Clinical Intake:  Pre-visit preparation completed: Yes  Pain : No/denies pain Pain Score: 0-No pain     BMI - recorded: 27.26 Nutritional Status: BMI 25 -29 Overweight Nutritional Risks: None Diabetes: No  How often do you need to have someone help you when you read instructions, pamphlets, or other written materials from your  doctor or pharmacy?: 1 - Never What is the last grade level you completed in school?: SOME COLLEGE  Interpreter Needed?: No  Information entered by :: Caylin Nass N. Parke Jandreau, LPN.   Activities of Daily Living    04/14/2023    3:08 PM  In your present state of health, do you have any difficulty performing the following activities:  Hearing? 0  Vision? 0  Difficulty concentrating or making decisions? 0  Walking or climbing stairs? 0  Dressing or bathing? 0  Doing errands, shopping? 0  Preparing Food and eating ? N  Using the Toilet? N  In the past six months, have you accidently leaked urine? N  Do you have problems with loss of bowel control? N  Managing your Medications? N  Managing your  Finances? N  Housekeeping or managing your Housekeeping? N    Patient Care Team: Etta Grandchild, MD as PCP - General (Internal Medicine) Janet Berlin, MD as Consulting Physician (Ophthalmology)  Indicate any recent Medical Services you may have received from other than [facility] providers in the past year (date may be approximate).     Assessment:   This is a routine wellness examination for [name].  Hearing/Vision screen Hearing Screening - Comments:: Patient denied any hearing difficulty.   No hearing aids.  Vision Screening - Comments:: Patient does not wear any corrective lenses/contacts.   Cataracts removed. Eye exam done by: Janet Berlin, MD.    Goals Addressed             This Visit's Progress    To get back to exercise routine.        Depression Screen    04/14/2023    3:06 PM 09/06/2021    8:05 AM 09/05/2020    8:28 AM 05/07/2018    2:16 PM  PHQ 2/9 Scores  PHQ - 2 Score 0 0 0 0  PHQ- 9 Score 0       Fall Risk    04/14/2023    3:06 PM 09/06/2021    8:04 AM 09/05/2020    8:25 AM 12/31/2018    3:50 PM  Fall Risk   Falls in the past year? 0 0 0 0  Number falls in past yr: 0   0  Injury with Fall? 0   0  Risk for fall due to : No Fall Risks     Follow up Falls  prevention discussed       MEDICARE RISK AT HOME: Medicare Risk at Home Any stairs in or around the home?: Yes If so, are there any without handrails?: No Home free of loose throw rugs in walkways, pet beds, electrical cords, etc?: Yes Adequate lighting in your home to reduce risk of falls?: Yes Life alert?: No Use of a cane, walker or w/c?: No Grab bars in the bathroom?: Yes Shower chair or bench in shower?: No Elevated toilet seat or a handicapped toilet?: No  TIMED UP AND GO:  Was the test performed? No    Cognitive Function:    04/14/2023    3:14 PM  MMSE - Mini Mental State Exam  Not completed: Unable to complete        04/14/2023    3:06 PM  6CIT Screen  What Year? 0 points  What month? 0 points  What time? 0 points  Count back from 20 0 points  Months in reverse 0 points  Repeat phrase 0 points  Total Score 0 points    Immunizations Immunization History  Administered Date(s) Administered   Fluad Quad(high Dose 65+) 05/19/2020   Influenza, High Dose Seasonal PF 04/17/2018   Influenza,inj,Quad PF,6+ Mos 04/15/2014, 04/18/2015, 06/13/2016, 03/20/2017, 03/27/2019   PFIZER Comirnaty(Gray Top)Covid-19 Tri-Sucrose Vaccine 10/05/2020   PFIZER(Purple Top)SARS-COV-2 Vaccination 07/20/2019, 07/20/2019, 08/11/2019, 04/04/2020   Pfizer Covid-19 Vaccine Bivalent Booster 62yrs & up 04/24/2021   Pfizer(Comirnaty)Fall Seasonal Vaccine 12 years and older 04/24/2022   Pneumococcal Conjugate-13 04/17/2018   Pneumococcal Polysaccharide-23 04/21/2019   Tdap 04/18/2015   Zoster Recombinant(Shingrix) 05/07/2018, 09/03/2018    TDAP status: Up to date  Flu Vaccine status: Due, Education has been provided regarding the importance of this vaccine. Advised may receive this vaccine at local pharmacy or Health Dept. Aware to provide a copy of the vaccination record if obtained from local pharmacy  or Health Dept. Verbalized acceptance and understanding.  Pneumococcal vaccine  status: Up to date  Covid-19 vaccine status: Completed vaccines  Qualifies for Shingles Vaccine? Yes   Zostavax completed No   Shingrix Completed?: Yes  Screening Tests Health Maintenance  Topic Date Due   INFLUENZA VACCINE  01/30/2023   COVID-19 Vaccine (8 - 2023-24 season) 03/02/2023   Medicare Annual Wellness (AWV)  04/13/2024   DTaP/Tdap/Td (2 - Td or Tdap) 04/17/2025   Colonoscopy  01/27/2026   Pneumonia Vaccine 33+ Years old  Completed   Hepatitis C Screening  Completed   Zoster Vaccines- Shingrix  Completed   HPV VACCINES  Aged Out    Health Maintenance  Health Maintenance Due  Topic Date Due   INFLUENZA VACCINE  01/30/2023   COVID-19 Vaccine (8 - 2023-24 season) 03/02/2023    Colorectal cancer screening: Type of screening: Colonoscopy. Completed 01/28/2023. Repeat every 3 years  Lung Cancer Screening: (Low Dose CT Chest recommended if Age 85-80 years, 20 pack-year currently smoking OR have quit w/in 15years.) does not qualify.   Lung Cancer Screening Referral: no  Additional Screening:  Hepatitis C Screening: does qualify; Completed 04/20/2015  Vision Screening: Recommended annual ophthalmology exams for early detection of glaucoma and other disorders of the eye. Is the patient up to date with their annual eye exam?  Yes  Who is the provider or what is the name of the office in which the patient attends annual eye exams? Janet Berlin, MD. If pt is not established with a provider, would they like to be referred to a provider to establish care? No .   Dental Screening: Recommended annual dental exams for proper oral hygiene  Diabetic Foot Exam: N/A  Community Resource Referral / Chronic Care Management: CRR required this visit?  No   CCM required this visit?  No    Plan:     I have personally reviewed and noted the following in the patient's chart:   Medical and social history Use of alcohol, tobacco or illicit drugs  Current medications and  supplements including opioid prescriptions. Patient is not currently taking opioid prescriptions. Functional ability and status Nutritional status Physical activity Advanced directives List of other physicians Hospitalizations, surgeries, and ER visits in previous 12 months Vitals Screenings to include cognitive, depression, and falls Referrals and appointments  In addition, I have reviewed and discussed with patient certain preventive protocols, quality metrics, and best practice recommendations. A written personalized care plan for preventive services as well as general preventive health recommendations were provided to patient.     Mickeal Needy, LPN   16/04/9603   After Visit Summary: (MyChart) Due to this being a telephonic visit, the after visit summary with patients personalized plan was offered to patient via MyChart   Nurse Notes: Normal cognitive status assessed by direct observation via telephone conversation by this Nurse Health Advisor. No abnormalities found.

## 2023-04-15 ENCOUNTER — Encounter: Payer: Self-pay | Admitting: Internal Medicine

## 2023-04-15 DIAGNOSIS — H43813 Vitreous degeneration, bilateral: Secondary | ICD-10-CM | POA: Diagnosis not present

## 2023-04-15 DIAGNOSIS — Z961 Presence of intraocular lens: Secondary | ICD-10-CM | POA: Diagnosis not present

## 2023-04-15 DIAGNOSIS — H04123 Dry eye syndrome of bilateral lacrimal glands: Secondary | ICD-10-CM | POA: Diagnosis not present

## 2023-04-17 ENCOUNTER — Other Ambulatory Visit (HOSPITAL_BASED_OUTPATIENT_CLINIC_OR_DEPARTMENT_OTHER): Payer: Self-pay

## 2023-04-17 ENCOUNTER — Ambulatory Visit: Payer: Medicare Other | Admitting: Internal Medicine

## 2023-04-17 ENCOUNTER — Telehealth: Payer: Self-pay | Admitting: Internal Medicine

## 2023-04-17 ENCOUNTER — Encounter: Payer: Self-pay | Admitting: Internal Medicine

## 2023-04-17 VITALS — BP 138/82 | HR 83 | Temp 98.1°F | Resp 16 | Ht 70.0 in | Wt 196.0 lb

## 2023-04-17 DIAGNOSIS — N401 Enlarged prostate with lower urinary tract symptoms: Secondary | ICD-10-CM | POA: Diagnosis not present

## 2023-04-17 DIAGNOSIS — Z0001 Encounter for general adult medical examination with abnormal findings: Secondary | ICD-10-CM | POA: Insufficient documentation

## 2023-04-17 DIAGNOSIS — N138 Other obstructive and reflux uropathy: Secondary | ICD-10-CM

## 2023-04-17 DIAGNOSIS — K219 Gastro-esophageal reflux disease without esophagitis: Secondary | ICD-10-CM | POA: Diagnosis not present

## 2023-04-17 DIAGNOSIS — Z136 Encounter for screening for cardiovascular disorders: Secondary | ICD-10-CM | POA: Diagnosis not present

## 2023-04-17 DIAGNOSIS — Z0000 Encounter for general adult medical examination without abnormal findings: Secondary | ICD-10-CM | POA: Diagnosis not present

## 2023-04-17 DIAGNOSIS — I10 Essential (primary) hypertension: Secondary | ICD-10-CM | POA: Diagnosis not present

## 2023-04-17 DIAGNOSIS — E785 Hyperlipidemia, unspecified: Secondary | ICD-10-CM | POA: Diagnosis not present

## 2023-04-17 LAB — URINALYSIS, ROUTINE W REFLEX MICROSCOPIC
Bilirubin Urine: NEGATIVE
Hgb urine dipstick: NEGATIVE
Ketones, ur: NEGATIVE
Nitrite: NEGATIVE
Specific Gravity, Urine: 1.01 (ref 1.000–1.030)
Total Protein, Urine: NEGATIVE
Urine Glucose: NEGATIVE
Urobilinogen, UA: 0.2 (ref 0.0–1.0)
pH: 6 (ref 5.0–8.0)

## 2023-04-17 LAB — LIPID PANEL
Cholesterol: 162 mg/dL (ref 0–200)
HDL: 46 mg/dL (ref >39.00)
LDL Cholesterol: 85 mg/dL (ref 0–99)
NonHDL: 116
Total CHOL/HDL Ratio: 4
Triglycerides: 155 mg/dL — ABNORMAL HIGH (ref 0.0–149.0)
VLDL: 31 mg/dL (ref 0.0–40.0)

## 2023-04-17 LAB — BASIC METABOLIC PANEL
BUN: 15 mg/dL (ref 6–23)
CO2: 27 meq/L (ref 19–32)
Calcium: 9 mg/dL (ref 8.4–10.5)
Chloride: 102 meq/L (ref 96–112)
Creatinine, Ser: 1.16 mg/dL (ref 0.40–1.50)
GFR: 64.02 mL/min (ref >60.00)
Glucose, Bld: 88 mg/dL (ref 70–99)
Potassium: 4.2 meq/L (ref 3.5–5.1)
Sodium: 137 meq/L (ref 135–145)

## 2023-04-17 LAB — TSH: TSH: 2.05 u[IU]/mL (ref 0.35–5.50)

## 2023-04-17 LAB — CBC WITH DIFFERENTIAL/PLATELET
Basophils Absolute: 0.1 10*3/uL (ref 0.0–0.1)
Basophils Relative: 0.8 % (ref 0.0–3.0)
Eosinophils Absolute: 0.2 10*3/uL (ref 0.0–0.7)
Eosinophils Relative: 2.7 % (ref 0.0–5.0)
HCT: 47.9 % (ref 39.0–52.0)
Hemoglobin: 16.1 g/dL (ref 13.0–17.0)
Lymphocytes Relative: 28.4 % (ref 12.0–46.0)
Lymphs Abs: 2 10*3/uL (ref 0.7–4.0)
MCHC: 33.6 g/dL (ref 30.0–36.0)
MCV: 97 fL (ref 78.0–100.0)
Monocytes Absolute: 0.8 10*3/uL (ref 0.1–1.0)
Monocytes Relative: 10.9 % (ref 3.0–12.0)
Neutro Abs: 4.1 10*3/uL (ref 1.4–7.7)
Neutrophils Relative %: 57.2 % (ref 43.0–77.0)
Platelets: 291 10*3/uL (ref 150.0–400.0)
RBC: 4.94 Mil/uL (ref 4.22–5.81)
RDW: 13.2 % (ref 11.5–15.5)
WBC: 7.2 10*3/uL (ref 4.0–10.5)

## 2023-04-17 LAB — HEPATIC FUNCTION PANEL
ALT: 14 U/L (ref 0–53)
AST: 17 U/L (ref 0–37)
Albumin: 4.2 g/dL (ref 3.5–5.2)
Alkaline Phosphatase: 89 U/L (ref 39–117)
Bilirubin, Direct: 0.1 mg/dL (ref 0.0–0.3)
Total Bilirubin: 0.5 mg/dL (ref 0.2–1.2)
Total Protein: 6.8 g/dL (ref 6.0–8.3)

## 2023-04-17 LAB — PSA: PSA: 1.75 ng/mL (ref 0.10–4.00)

## 2023-04-17 MED ORDER — TADALAFIL 5 MG PO TABS: 5.0000 mg | ORAL_TABLET | Freq: Every day | ORAL | 0 refills | Status: DC

## 2023-04-17 MED ORDER — COMIRNATY 30 MCG/0.3ML IM SUSY
0.3000 mL | PREFILLED_SYRINGE | Freq: Once | INTRAMUSCULAR | 0 refills | Status: DC
  Filled 2023-04-17: qty 0.3, 1d supply, fill #0

## 2023-04-17 MED ORDER — FLUAD 0.5 ML IM SUSY
0.5000 mL | PREFILLED_SYRINGE | Freq: Once | INTRAMUSCULAR | 0 refills | Status: DC
  Filled 2023-04-17: qty 0.5, 1d supply, fill #0

## 2023-04-17 MED ORDER — ROSUVASTATIN CALCIUM 10 MG PO TABS
10.0000 mg | ORAL_TABLET | Freq: Every day | ORAL | 0 refills | Status: DC
Start: 2023-04-17 — End: 2023-06-18

## 2023-04-17 MED ORDER — TADALAFIL 5 MG PO TABS
5.0000 mg | ORAL_TABLET | Freq: Every day | ORAL | 0 refills | Status: DC
Start: 2023-04-17 — End: 2023-04-17

## 2023-04-17 NOTE — Telephone Encounter (Signed)
Patient states that insurance will not cover his Cialis - He wanted it call to [facility] [address] so he can use good RX.  Please resend to [facility] instead of [facility].

## 2023-04-17 NOTE — Telephone Encounter (Signed)
Pt is calling about getting another referral to gastro the one he had is retiring and wants to see Carie Caddy. Rhea Belton, MD [facility] [address] [place] [address] [place], [place] [address]

## 2023-04-17 NOTE — Progress Notes (Signed)
Subjective:  Patient ID: [name], male    DOB: 09-Feb-1951  Age: 72 y.o. MRN: [id number]  CC: Annual Exam, Gastroesophageal Reflux, Hyperlipidemia, and Hypertension   HPI [name] presents for a CPX and f/up ---  He is active and denies DOE, CP, SOB, edema.  Outpatient Medications Prior to Visit  Medication Sig Dispense Refill   famotidine (PEPCID) 40 MG tablet TAKE 1 TABLET BY MOUTH AT  BEDTIME 90 tablet 3   pantoprazole (PROTONIX) 40 MG tablet TAKE 1 TABLET BY MOUTH TWICE  DAILY BEFORE A MEAL 180 tablet 2   COVID-19 mRNA vaccine 2023-2024 (COMIRNATY) syringe Inject into the muscle. 0.3 mL 0   COVID-19 mRNA vaccine, Pfizer, (COMIRNATY) syringe Inject 0.3 mLs into the muscle once for 1 dose. 0.3 mL 0   influenza vaccine adjuvanted (FLUAD) 0.5 ML injection Inject into the muscle. 0.5 mL 0   influenza vaccine adjuvanted (FLUAD) 0.5 ML injection Inject 0.5 mLs into the muscle once for 1 dose. 0.5 mL 0   sildenafil (REVATIO) 20 MG tablet Take 3 tablets (60 mg total) by mouth daily as needed. 50 tablet 5   tadalafil (CIALIS) 5 MG tablet Take 1 tablet (5 mg total) by mouth daily. 90 tablet 1   0.9 %  sodium chloride infusion      No facility-administered medications prior to visit.    ROS Review of Systems  Constitutional: Negative.  Negative for diaphoresis, fatigue and unexpected weight change.  HENT: Negative.    Eyes: Negative.   Respiratory: Negative.  Negative for apnea, cough and wheezing.   Cardiovascular:  Negative for chest pain, palpitations and leg swelling.  Gastrointestinal:  Negative for abdominal pain, constipation, diarrhea, nausea and vomiting.  Genitourinary:  Positive for difficulty urinating. Negative for dysuria, scrotal swelling and testicular pain.  Musculoskeletal: Negative.  Negative for arthralgias and myalgias.  Skin: Negative.   Neurological:  Negative for dizziness and headaches.  Hematological:  Negative for adenopathy. Does not  bruise/bleed easily.  Psychiatric/Behavioral:  Positive for confusion and decreased concentration. Negative for sleep disturbance. The patient is not nervous/anxious.     Objective:  BP 138/82 (BP Location: Left Arm, Patient Position: Sitting, Cuff Size: Large)   Pulse 83   Temp 98.1 F (36.7 C) (Oral)   Resp 16   Ht 5\' 10"  (1.778 m)   Wt 196 lb (88.9 kg)   SpO2 97%   BMI 28.12 kg/m   BP Readings from Last 3 Encounters:  04/17/23 138/82  01/28/23 104/74  09/06/21 126/82    Wt Readings from Last 3 Encounters:  04/17/23 196 lb (88.9 kg)  04/14/23 190 lb (86.2 kg)  01/28/23 190 lb (86.2 kg)    Physical Exam Vitals reviewed.  Constitutional:      Appearance: Normal appearance.  HENT:     Nose: Nose normal.     Mouth/Throat:     Mouth: Mucous membranes are moist.  Eyes:     General: No scleral icterus.    Conjunctiva/sclera: Conjunctivae normal.  Cardiovascular:     Rate and Rhythm: Normal rate and regular rhythm.     Heart sounds: Normal heart sounds, S1 normal and S2 normal. No murmur heard.    No friction rub. No gallop.     Comments: EKG- NSR, 65 bpm No LVH, Q waves, or ST/T waves  Pulmonary:     Effort: Pulmonary effort is normal.     Breath sounds: No stridor. No wheezing, rhonchi or rales.  Abdominal:  General: Abdomen is flat.     Palpations: There is no mass.     Tenderness: There is no abdominal tenderness. There is no guarding.     Hernia: No hernia is present. There is no hernia in the left inguinal area or right inguinal area.  Genitourinary:    Pubic Area: No rash.      Penis: Normal and circumcised.      Testes: Normal.     Epididymis:     Right: Normal.     Left: Normal.     Prostate: Enlarged. Not tender and no nodules present.     Rectum: Normal. Guaiac result negative. No mass, tenderness, anal fissure, external hemorrhoid or internal hemorrhoid. Normal anal tone.  Musculoskeletal:     Cervical back: Neck supple.     Right lower leg:  No edema.     Left lower leg: No edema.  Lymphadenopathy:     Cervical: No cervical adenopathy.     Lower Body: No right inguinal adenopathy. No left inguinal adenopathy.  Skin:    General: Skin is warm and dry.     Findings: No rash.  Neurological:     General: No focal deficit present.     Mental Status: He is alert. Mental status is at baseline.  Psychiatric:        Mood and Affect: Mood normal.        Behavior: Behavior normal.        Thought Content: Thought content normal.        Judgment: Judgment normal.     Lab Results  Component Value Date   WBC 7.2 04/17/2023   HGB 16.1 04/17/2023   HCT 47.9 04/17/2023   PLT 291.0 04/17/2023   GLUCOSE 88 04/17/2023   CHOL 162 04/17/2023   TRIG 155.0 (H) 04/17/2023   HDL 46.00 04/17/2023   LDLCALC 85 04/17/2023   ALT 14 04/17/2023   AST 17 04/17/2023   NA 137 04/17/2023   K 4.2 04/17/2023   CL 102 04/17/2023   CREATININE 1.16 04/17/2023   BUN 15 04/17/2023   CO2 27 04/17/2023   TSH 2.05 04/17/2023   PSA 1.75 04/17/2023   INR 1.1 (H) 03/20/2017   HGBA1C 4.9 10/08/2011    MR SHOULDER LEFT WO CONTRAST  Result Date: 04/26/2019 CLINICAL DATA:  Shoulder pain for 2 months. EXAM: MRI OF THE LEFT SHOULDER WITHOUT CONTRAST TECHNIQUE: Multiplanar, multisequence MR imaging of the shoulder was performed. No intravenous contrast was administered. COMPARISON:  Radiographs dated 01/06/2019. FINDINGS: Rotator cuff: There is slight degenerative changes of the distal supraspinatus tendon. The remainder of the rotator cuff appears normal. Muscles: No atrophy or abnormal signal of the muscles of the rotator cuff. Biceps long head:  Properly located and intact. Acromioclavicular Joint: Minimal AC joint arthropathy. Type 2 acromion. Minimal amount of fluid in the subacromial bursa, not felt to be significant. Glenohumeral Joint: Small glenohumeral joint effusion. No chondral defect. Labrum:  Normal. Bones:  Normal. Other: None IMPRESSION: 1. Slight  degenerative changes of the distal supraspinatus tendon. 2. Small glenohumeral joint effusion which could represent synovitis. 3. Otherwise, normal exam. Electronically Signed   By: Francene Boyers M.D.   On: 04/26/2019 08:42    Assessment & Plan:   Gastroesophageal reflux disease without esophagitis - Symptoms are well controlled. -     CBC with Differential/Platelet; Future -     Basic metabolic panel; Future  Hyperlipidemia LDL goal <130 - Will start a statin for CV risk  reduction. -     Lipid panel; Future -     TSH; Future -     Hepatic function panel; Future -     Rosuvastatin Calcium; Take 1 tablet (10 mg total) by mouth daily.  Dispense: 90 tablet; Refill: 0  BPH with obstruction/lower urinary tract symptoms -     PSA; Future -     Urinalysis, Routine w reflex microscopic; Future -     Basic metabolic panel; Future  Screening, heart disease, ischemic  Primary hypertension- EKG is negative for LVH. Will recheck his BP in 3 months.  Encounter for general adult medical examination with abnormal findings - Exam completed, labs reviewed, vaccines reviewed, cancer screenings addressed, pt ed material was given.      Follow-up: Return in about 3 months (around 07/18/2023).  Sanda Linger, MD

## 2023-04-17 NOTE — Patient Instructions (Signed)
Health Maintenance, Male Adopting a healthy lifestyle and getting preventive care are important in promoting health and wellness. Ask your health care provider about: The right schedule for you to have regular tests and exams. Things you can do on your own to prevent diseases and keep yourself healthy. What should I know about diet, weight, and exercise? Eat a healthy diet  Eat a diet that includes plenty of vegetables, fruits, low-fat dairy products, and lean protein. Do not eat a lot of foods that are high in solid fats, added sugars, or sodium. Maintain a healthy weight Body mass index (BMI) is a measurement that can be used to identify possible weight problems. It estimates body fat based on height and weight. Your health care provider can help determine your BMI and help you achieve or maintain a healthy weight. Get regular exercise Get regular exercise. This is one of the most important things you can do for your health. Most adults should: Exercise for at least 150 minutes each week. The exercise should increase your heart rate and make you sweat (moderate-intensity exercise). Do strengthening exercises at least twice a week. This is in addition to the moderate-intensity exercise. Spend less time sitting. Even light physical activity can be beneficial. Watch cholesterol and blood lipids Have your blood tested for lipids and cholesterol at 70 years of age, then have this test every 5 years. You may need to have your cholesterol levels checked more often if: Your lipid or cholesterol levels are high. You are older than 70 years of age. You are at high risk for heart disease. What should I know about cancer screening? Many types of cancers can be detected early and may often be prevented. Depending on your health history and family history, you may need to have cancer screening at various ages. This may include screening for: Colorectal cancer. Prostate cancer. Skin cancer. Lung  cancer. What should I know about heart disease, diabetes, and high blood pressure? Blood pressure and heart disease High blood pressure causes heart disease and increases the risk of stroke. This is more likely to develop in people who have high blood pressure readings or are overweight. Talk with your health care provider about your target blood pressure readings. Have your blood pressure checked: Every 3-5 years if you are 18-39 years of age. Every year if you are 40 years old or older. If you are between the ages of 65 and 75 and are a current or former smoker, ask your health care provider if you should have a one-time screening for abdominal aortic aneurysm (AAA). Diabetes Have regular diabetes screenings. This checks your fasting blood sugar level. Have the screening done: Once every three years after age 45 if you are at a normal weight and have a low risk for diabetes. More often and at a younger age if you are overweight or have a high risk for diabetes. What should I know about preventing infection? Hepatitis B If you have a higher risk for hepatitis B, you should be screened for this virus. Talk with your health care provider to find out if you are at risk for hepatitis B infection. Hepatitis C Blood testing is recommended for: Everyone born from 1945 through 1965. Anyone with known risk factors for hepatitis C. Sexually transmitted infections (STIs) You should be screened each year for STIs, including gonorrhea and chlamydia, if: You are sexually active and are younger than 70 years of age. You are older than 70 years of age and your   health care provider tells you that you are at risk for this type of infection. Your sexual activity has changed since you were last screened, and you are at increased risk for chlamydia or gonorrhea. Ask your health care provider if you are at risk. Ask your health care provider about whether you are at high risk for HIV. Your health care provider  may recommend a prescription medicine to help prevent HIV infection. If you choose to take medicine to prevent HIV, you should first get tested for HIV. You should then be tested every 3 months for as long as you are taking the medicine. Follow these instructions at home: Alcohol use Do not drink alcohol if your health care provider tells you not to drink. If you drink alcohol: Limit how much you have to 0-2 drinks a day. Know how much alcohol is in your drink. In the [place] one drink equals one 12 oz bottle of beer (355 mL), one 5 oz glass of wine (148 mL), or one 1 oz glass of hard liquor (44 mL). Lifestyle Do not use any products that contain nicotine or tobacco. These products include cigarettes, chewing tobacco, and vaping devices, such as e-cigarettes. If you need help quitting, ask your health care provider. Do not use street drugs. Do not share needles. Ask your health care provider for help if you need support or information about quitting drugs. General instructions Schedule regular health, dental, and eye exams. Stay current with your vaccines. Tell your health care provider if: You often feel depressed. You have ever been abused or do not feel safe at home. Summary Adopting a healthy lifestyle and getting preventive care are important in promoting health and wellness. Follow your health care provider's instructions about healthy diet, exercising, and getting tested or screened for diseases. Follow your health care provider's instructions on monitoring your cholesterol and blood pressure. This information is not intended to replace advice given to you by your health care provider. Make sure you discuss any questions you have with your health care provider. Document Revised: 11/06/2020 Document Reviewed: 11/06/2020 Elsevier Patient Education  2024 Elsevier Inc.  

## 2023-04-17 NOTE — Telephone Encounter (Signed)
Rx sent to [facility] per pt request.

## 2023-04-18 ENCOUNTER — Telehealth: Payer: Self-pay | Admitting: Gastroenterology

## 2023-04-18 NOTE — Telephone Encounter (Signed)
Good Afternoon Dr Russella Dar   We received a call from patient requesting to transfer care to Dr Rhea Belton due to your retirement.   Please review and advise.   Thank you

## 2023-04-18 NOTE — Addendum Note (Signed)
Addended by: Darryll Capers on: 04/18/2023 08:31 AM   Modules accepted: Orders

## 2023-04-20 ENCOUNTER — Encounter: Payer: Self-pay | Admitting: Internal Medicine

## 2023-04-21 ENCOUNTER — Other Ambulatory Visit: Payer: Self-pay | Admitting: Internal Medicine

## 2023-04-21 NOTE — Progress Notes (Unsigned)
Subjective:  Patient ID: [name], male    DOB: 12-30-1950  Age: 72 y.o. MRN: [id number]  CC: No chief complaint on file.   HPI [name] presents for ***  Outpatient Medications Prior to Visit  Medication Sig Dispense Refill   famotidine (PEPCID) 40 MG tablet TAKE 1 TABLET BY MOUTH AT  BEDTIME 90 tablet 3   pantoprazole (PROTONIX) 40 MG tablet TAKE 1 TABLET BY MOUTH TWICE  DAILY BEFORE A MEAL 180 tablet 2   rosuvastatin (CRESTOR) 10 MG tablet Take 1 tablet (10 mg total) by mouth daily. 90 tablet 0   tadalafil (CIALIS) 5 MG tablet Take 1 tablet (5 mg total) by mouth daily. 90 tablet 0   No facility-administered medications prior to visit.    ROS Review of Systems  Objective:  There were no vitals taken for this visit.  BP Readings from Last 3 Encounters:  04/17/23 138/82  01/28/23 104/74  09/06/21 126/82    Wt Readings from Last 3 Encounters:  04/17/23 196 lb (88.9 kg)  04/14/23 190 lb (86.2 kg)  01/28/23 190 lb (86.2 kg)    Physical Exam  Lab Results  Component Value Date   WBC 7.2 04/17/2023   HGB 16.1 04/17/2023   HCT 47.9 04/17/2023   PLT 291.0 04/17/2023   GLUCOSE 88 04/17/2023   CHOL 162 04/17/2023   TRIG 155.0 (H) 04/17/2023   HDL 46.00 04/17/2023   LDLCALC 85 04/17/2023   ALT 14 04/17/2023   AST 17 04/17/2023   NA 137 04/17/2023   K 4.2 04/17/2023   CL 102 04/17/2023   CREATININE 1.16 04/17/2023   BUN 15 04/17/2023   CO2 27 04/17/2023   TSH 2.05 04/17/2023   PSA 1.75 04/17/2023   INR 1.1 (H) 03/20/2017   HGBA1C 4.9 10/08/2011    MR SHOULDER LEFT WO CONTRAST  Result Date: 04/26/2019 CLINICAL DATA:  Shoulder pain for 2 months. EXAM: MRI OF THE LEFT SHOULDER WITHOUT CONTRAST TECHNIQUE: Multiplanar, multisequence MR imaging of the shoulder was performed. No intravenous contrast was administered. COMPARISON:  Radiographs dated 01/06/2019. FINDINGS: Rotator cuff: There is slight degenerative changes of the distal supraspinatus  tendon. The remainder of the rotator cuff appears normal. Muscles: No atrophy or abnormal signal of the muscles of the rotator cuff. Biceps long head:  Properly located and intact. Acromioclavicular Joint: Minimal AC joint arthropathy. Type 2 acromion. Minimal amount of fluid in the subacromial bursa, not felt to be significant. Glenohumeral Joint: Small glenohumeral joint effusion. No chondral defect. Labrum:  Normal. Bones:  Normal. Other: None IMPRESSION: 1. Slight degenerative changes of the distal supraspinatus tendon. 2. Small glenohumeral joint effusion which could represent synovitis. 3. Otherwise, normal exam. Electronically Signed   By: Francene Boyers M.D.   On: 04/26/2019 08:42    Assessment & Plan:  There are no diagnoses linked to this encounter.   Follow-up: No follow-ups on file.  Sanda Linger, MD

## 2023-04-22 ENCOUNTER — Other Ambulatory Visit: Payer: Self-pay | Admitting: Internal Medicine

## 2023-04-22 DIAGNOSIS — R072 Precordial pain: Secondary | ICD-10-CM | POA: Insufficient documentation

## 2023-05-07 ENCOUNTER — Other Ambulatory Visit (HOSPITAL_COMMUNITY): Payer: Self-pay

## 2023-05-12 ENCOUNTER — Ambulatory Visit: Payer: Medicare Other | Admitting: Internal Medicine

## 2023-05-15 ENCOUNTER — Ambulatory Visit: Payer: Medicare Other | Admitting: Internal Medicine

## 2023-05-15 ENCOUNTER — Other Ambulatory Visit (HOSPITAL_COMMUNITY): Payer: Self-pay | Admitting: Emergency Medicine

## 2023-05-15 ENCOUNTER — Encounter (HOSPITAL_COMMUNITY): Payer: Self-pay

## 2023-05-15 MED ORDER — METOPROLOL TARTRATE 100 MG PO TABS: 100.0000 mg | ORAL_TABLET | Freq: Once | ORAL | 0 refills | Status: DC

## 2023-05-16 ENCOUNTER — Telehealth (HOSPITAL_COMMUNITY): Payer: Self-pay | Admitting: *Deleted

## 2023-05-16 NOTE — Telephone Encounter (Signed)
Reaching out to patient to offer assistance regarding upcoming cardiac imaging study; pt verbalizes understanding of appt date/time, parking situation and where to check in, pre-test NPO status and medications ordered, and verified current allergies; name and call back number provided for further questions should they arise  Larey Brick RN Navigator Cardiac Imaging [facility] [phone] office [phone] cell  Patient to take 100mg  metoprolol tartrate two hours prior to his cardiac CT scan. He is aware to arrive at 9 AM.

## 2023-05-19 ENCOUNTER — Ambulatory Visit (HOSPITAL_COMMUNITY)
Admission: RE | Admit: 2023-05-19 | Discharge: 2023-05-19 | Disposition: A | Discharge status: Home / Self Care | Payer: Medicare Other | Source: Ambulatory Visit | Attending: Internal Medicine | Admitting: Internal Medicine

## 2023-05-19 DIAGNOSIS — R072 Precordial pain: Secondary | ICD-10-CM | POA: Diagnosis not present

## 2023-05-19 MED ORDER — NITROGLYCERIN 0.4 MG SL SUBL
0.8000 mg | SUBLINGUAL_TABLET | Freq: Once | SUBLINGUAL | Status: AC
  Administered 2023-05-19: 0.8 mg via SUBLINGUAL

## 2023-05-19 MED ORDER — IOHEXOL 350 MG/ML SOLN
95.0000 mL | Freq: Once | INTRAVENOUS | Status: AC | PRN
  Administered 2023-05-19: 95 mL via INTRAVENOUS

## 2023-05-19 MED ORDER — NITROGLYCERIN 0.4 MG SL SUBL
SUBLINGUAL_TABLET | SUBLINGUAL | Status: AC
  Filled 2023-05-19: qty 2

## 2023-05-20 ENCOUNTER — Other Ambulatory Visit: Payer: Self-pay | Admitting: Internal Medicine

## 2023-05-20 ENCOUNTER — Encounter: Payer: Self-pay | Admitting: Internal Medicine

## 2023-06-02 ENCOUNTER — Encounter: Payer: Self-pay | Admitting: Gastroenterology

## 2023-06-02 NOTE — Telephone Encounter (Signed)
Advised patient that he can schedule with Dr. Rhea Belton when needed

## 2023-06-11 ENCOUNTER — Other Ambulatory Visit: Payer: Self-pay | Admitting: Internal Medicine

## 2023-06-11 DIAGNOSIS — R072 Precordial pain: Secondary | ICD-10-CM

## 2023-06-16 ENCOUNTER — Other Ambulatory Visit (HOSPITAL_BASED_OUTPATIENT_CLINIC_OR_DEPARTMENT_OTHER): Payer: Self-pay

## 2023-06-16 MED ORDER — AREXVY 120 MCG/0.5ML IM SUSR
INTRAMUSCULAR | 0 refills | Status: DC
  Filled 2023-06-16: qty 0.5, 1d supply, fill #0

## 2023-06-18 ENCOUNTER — Other Ambulatory Visit: Payer: Self-pay | Admitting: Internal Medicine

## 2023-06-18 DIAGNOSIS — E785 Hyperlipidemia, unspecified: Secondary | ICD-10-CM

## 2023-07-13 ENCOUNTER — Other Ambulatory Visit: Payer: Self-pay | Admitting: Internal Medicine

## 2023-07-13 DIAGNOSIS — N138 Other obstructive and reflux uropathy: Secondary | ICD-10-CM

## 2023-07-16 ENCOUNTER — Encounter: Payer: Self-pay | Admitting: Internal Medicine

## 2023-07-17 ENCOUNTER — Other Ambulatory Visit: Payer: Self-pay

## 2023-07-17 NOTE — Progress Notes (Signed)
Patient requested that we remove Tadalafil from his list, and D/C any further refills. Spoke with his pharmacy and cancelled his refills on the medication

## 2023-08-13 ENCOUNTER — Ambulatory Visit: Payer: Medicare Other | Attending: Cardiovascular Disease | Admitting: Cardiovascular Disease

## 2023-08-13 ENCOUNTER — Encounter: Payer: Self-pay | Admitting: Cardiovascular Disease

## 2023-08-13 VITALS — BP 120/82 | HR 78 | Ht 70.0 in | Wt 197.8 lb

## 2023-08-13 DIAGNOSIS — E785 Hyperlipidemia, unspecified: Secondary | ICD-10-CM

## 2023-08-13 DIAGNOSIS — I10 Essential (primary) hypertension: Secondary | ICD-10-CM

## 2023-08-13 DIAGNOSIS — R931 Abnormal findings on diagnostic imaging of heart and coronary circulation: Secondary | ICD-10-CM | POA: Insufficient documentation

## 2023-08-13 NOTE — Progress Notes (Signed)
08/13/2023 [name]   Mar 27, 1951  [id number]  Primary Physician Etta Grandchild, MD Primary Cardiologist: Runell Gess MD Nicholes Calamity, [place]  HPI:  [name] is a 73 y.o. 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  Apparently I took care of his mom back in 1995 when I performed PCI on her.  He was referred by Dr. Yetta Barre, his PCP, because of a mildly elevated coronary calcium score seen during coronary CTA 05/19/2023.  The score was 124 with mild nonobstructive CAD.  His risk factors only include mild hyperlipidemia as well as family history.  He never smoked.  He denies chest pain or shortness of breath.  He is fairly active and works out on the elliptical and bike for 15 minutes at a time 3 times a week.   Current Meds  Medication Sig   famotidine (PEPCID) 40 MG tablet TAKE 1 TABLET BY MOUTH AT  BEDTIME   pantoprazole (PROTONIX) 40 MG tablet TAKE 1 TABLET BY MOUTH TWICE  DAILY BEFORE A MEAL   RSV vaccine recomb adjuvanted (AREXVY) 120 MCG/0.5ML injection Inject into the muscle.     No Known Allergies  Social History   Socioeconomic History   Marital status: Married    Spouse name: Not on file   Number of children: Not on file   Years of education: Not on file   Highest education level: Not on file  Occupational History   Not on file  Tobacco Use   Smoking status: Never   Smokeless tobacco: Never  Vaping Use   Vaping status: Never Used  Substance and Sexual Activity   Alcohol use: Yes    Comment: Occasional   Drug use: No   Sexual activity: Yes  Other Topics Concern   Not on file  Social History Narrative   Not on file   Social Drivers of Health   Financial Resource Strain: Low Risk  (04/14/2023)   Overall Financial Resource Strain (CARDIA)    Difficulty of Paying Living Expenses: Not hard at all  Food Insecurity: No Food Insecurity (04/14/2023)    Hunger Vital Sign    Worried About Running Out of Food in the Last Year: Never true    Ran Out of Food in the Last Year: Never true  Transportation Needs: No Transportation Needs (04/14/2023)   PRAPARE - Administrator, Civil Service (Medical): No    Lack of Transportation (Non-Medical): No  Physical Activity: Inactive (04/14/2023)   Exercise Vital Sign    Days of Exercise per Week: 0 days    Minutes of Exercise per Session: 0 min  Stress: No Stress Concern Present (04/14/2023)   [employer] of Occupational Health - Occupational Stress Questionnaire    Feeling of Stress : Not at all  Social Connections: Unknown (04/14/2023)   Social Connection and Isolation Panel [NHANES]    Frequency of Communication with Friends and Family: More than three times a week    Frequency of Social Gatherings with Friends and Family: More than three times a week    Attends Religious Services: Not on file    Active Member of Clubs or Organizations: Yes    Attends Banker Meetings: More than 4 times per year    Marital Status: Married  Catering manager Violence: Not At Risk (04/14/2023)   Humiliation, Afraid, Rape, and Kick questionnaire  Fear of Current or Ex-Partner: No    Emotionally Abused: No    Physically Abused: No    Sexually Abused: No     Review of Systems: General: negative for chills, fever, night sweats or weight changes.  Cardiovascular: negative for chest pain, dyspnea on exertion, edema, orthopnea, palpitations, paroxysmal nocturnal dyspnea or shortness of breath Dermatological: negative for rash Respiratory: negative for cough or wheezing Urologic: negative for hematuria Abdominal: negative for nausea, vomiting, diarrhea, bright red blood per rectum, melena, or hematemesis Neurologic: negative for visual changes, syncope, or dizziness All other systems reviewed and are otherwise negative except as noted above.    Blood pressure 120/82, pulse 78,  height 5\' 10"  (1.778 m), weight 197 lb 12.8 oz (89.7 kg), SpO2 99%.  General appearance: alert and no distress Neck: no adenopathy, no carotid bruit, no JVD, supple, symmetrical, trachea midline, and thyroid not enlarged, symmetric, no tenderness/mass/nodules Lungs: clear to auscultation bilaterally Heart: regular rate and rhythm, S1, S2 normal, no murmur, click, rub or gallop Extremities: extremities normal, atraumatic, no cyanosis or edema Pulses: 2+ and symmetric Skin: Skin color, texture, turgor normal. No rashes or lesions Neurologic: Grossly normal  EKG EKG Interpretation Date/Time:  Wednesday August 13 2023 09:40:39 EST Ventricular Rate:  78 PR Interval:  148 QRS Duration:  90 QT Interval:  378 QTC Calculation: 430 R Axis:   -24  Text Interpretation: Normal sinus rhythm Normal ECG When compared with ECG of 27-Sep-2014 21:14, Premature ventricular complexes are no longer Present Incomplete right bundle branch block is no longer Present Confirmed by Nanetta Batty [phone]) on 08/13/2023 9:45:03 AM    ASSESSMENT AND PLAN:   Hyperlipidemia LDL goal <130 History of mild hyperlipidemia lipid profile performed 04/17/2023 revealed total cholesterol 163, LDL 85 HDL 46.  He was started on statin therapy briefly but he did not tolerate this so he is no longer taking it.  He did have a mildly elevated coronary calcium score of 124 and as a result I would like his LDL to be less than 70.  We have talked about dietary modification/lifestyle modification initially.  Will recheck his lipid liver profile in 3 months.  If he is not at goal we will probably add Zetia.  Elevated coronary artery calcium score Coronary calcium score performed 05/19/2023 was 124, CTA revealed mild nonobstructive CAD.  He is asymptomatic.  We talked about the importance of risk factor modification.     Runell Gess MD FACP,FACC,FAHA, [facility] 08/13/2023 10:01 AM

## 2023-08-13 NOTE — Patient Instructions (Signed)
Medication Instructions:  Your physician recommends that you continue on your current medications as directed. Please refer to the Current Medication list given to you today.  *If you need a refill on your cardiac medications before your next appointment, please call your pharmacy*   Lab Work: Your physician recommends that you return for lab work in: 3 months for FASTING lipid/liver panel  If you have labs (blood work) drawn today and your tests are completely normal, you will receive your results only by: MyChart Message (if you have MyChart) OR A paper copy in the mail If you have any lab test that is abnormal or we need to change your treatment, we will call you to review the results.   Follow-Up: At [facility], you and your health needs are our priority.  As part of our continuing mission to provide you with exceptional heart care, we have created designated Provider Care Teams.  These Care Teams include your primary Cardiologist (physician) and Advanced Practice Providers (APPs -  Physician Assistants and Nurse Practitioners) who all work together to provide you with the care you need, when you need it.  We recommend signing up for the patient portal called "MyChart".  Sign up information is provided on this After Visit Summary.  MyChart is used to connect with patients for Virtual Visits (Telemedicine).  Patients are able to view lab/test results, encounter notes, upcoming appointments, etc.  Non-urgent messages can be sent to your provider as well.   To learn more about what you can do with MyChart, go to [email or web address].    Your next appointment:   12 month(s)  Provider:   Nanetta Batty, MD    Other Instructions    Heart-Healthy Eating Plan Eating a healthy diet is important for the health of your heart. A heart-healthy eating plan includes: Eating less unhealthy fats. Eating more healthy fats. Eating less salt in your food. Salt is also called  sodium. Making other changes in your diet. Cooking Avoid frying your food. Try to bake, boil, grill, or broil it instead. You can also reduce fat by: Removing the skin from poultry. Removing all visible fats from meats. Steaming vegetables in water or broth. Meal planning  At meals, divide your plate into four equal parts: Fill one-half of your plate with vegetables and green salads. Fill one-fourth of your plate with whole grains. Fill one-fourth of your plate with lean protein foods. Eat 2-4 cups of vegetables per day. One cup of vegetables is: 1 cup (91 g) broccoli or cauliflower florets. 2 medium carrots. 1 large bell pepper. 1 large sweet potato. 1 large tomato. 1 medium white potato. 2 cups (150 g) raw leafy greens. Eat 1-2 cups of fruit per day. One cup of fruit is: 1 small apple 1 large banana 1 cup (237 g) mixed fruit, 1 large orange,  cup (82 g) dried fruit, 1 cup (240 mL) 100% fruit juice. Eat more foods that have soluble fiber. These are apples, broccoli, carrots, beans, peas, and barley. Try to get 20-30 g of fiber per day. Eat 4-5 servings of nuts, legumes, and seeds per week: 1 serving of dried beans or legumes equals  cup (90 g) cooked. 1 serving of nuts is  oz (12 almonds, 24 pistachios, or 7 walnut halves). 1 serving of seeds equals  oz (8 g). General information Eat more home-cooked food. Eat less restaurant, buffet, and fast food. Limit or avoid alcohol. Limit foods that are high in starch and sugar. Avoid  fried foods. Lose weight if you are overweight. Keep track of how much salt (sodium) you eat. This is important if you have high blood pressure. Ask your doctor to tell you more about this. Try to add vegetarian meals each week. Fats Choose healthy fats. These include olive oil and canola oil, flaxseeds, walnuts, almonds, and seeds. Eat more omega-3 fats. These include salmon, mackerel, sardines, tuna, flaxseed oil, and ground flaxseeds. Try to  eat fish at least 2 times each week. Check food labels. Avoid foods with trans fats or high amounts of saturated fat. Limit saturated fats. These are often found in animal products, such as meats, butter, and cream. These are also found in plant foods, such as palm oil, palm kernel oil, and coconut oil. Avoid foods with partially hydrogenated oils in them. These have trans fats. Examples are stick margarine, some tub margarines, cookies, crackers, and other baked goods. What foods should I eat? Fruits All fresh, canned (in natural juice), or frozen fruits. Vegetables Fresh or frozen vegetables (raw, steamed, roasted, or grilled). Green salads. Grains Most grains. Choose whole wheat and whole grains most of the time. Rice and pasta, including brown rice and pastas made with whole wheat. Meats and other proteins Lean, well-trimmed beef, veal, pork, and lamb. Chicken and [place] without skin. All fish and shellfish. Wild duck, rabbit, pheasant, and venison. Egg whites or low-cholesterol egg substitutes. Dried beans, peas, lentils, and tofu. Seeds and most nuts. Dairy Low-fat or nonfat cheeses, including ricotta and mozzarella. Skim or 1% milk that is liquid, powdered, or evaporated. Buttermilk that is made with low-fat milk. Nonfat or low-fat yogurt. Fats and oils Non-hydrogenated (trans-free) margarines. Vegetable oils, including soybean, sesame, sunflower, olive, peanut, safflower, corn, canola, and cottonseed. Salad dressings or mayonnaise made with a vegetable oil. Beverages Mineral water. Coffee and tea. Diet carbonated beverages. Sweets and desserts Sherbet, gelatin, and fruit ice. Small amounts of dark chocolate. Limit all sweets and desserts. Seasonings and condiments All seasonings and condiments. The items listed above may not be a complete list of foods and drinks you can eat. Contact a dietitian for more options. What foods should I avoid? Fruits Canned fruit in heavy syrup.  Fruit in cream or butter sauce. Fried fruit. Limit coconut. Vegetables Vegetables cooked in cheese, cream, or butter sauce. Fried vegetables. Grains Breads that are made with saturated or trans fats, oils, or whole milk. Croissants. Sweet rolls. Donuts. High-fat crackers, such as cheese crackers. Meats and other proteins Fatty meats, such as hot dogs, ribs, sausage, bacon, rib-eye roast or steak. High-fat deli meats, such as salami and bologna. Caviar. Domestic duck and goose. Organ meats, such as liver. Dairy Cream, sour cream, cream cheese, and creamed cottage cheese. Whole-milk cheeses. Whole or 2% milk that is liquid, evaporated, or condensed. Whole buttermilk. Cream sauce or high-fat cheese sauce. Yogurt that is made from whole milk. Fats and oils Meat fat, or shortening. Cocoa butter, hydrogenated oils, palm oil, coconut oil, palm kernel oil. Solid fats and shortenings, including bacon fat, salt pork, lard, and butter. Nondairy cream substitutes. Salad dressings with cheese or sour cream. Beverages Regular sodas and juice drinks with added sugar. Sweets and desserts Frosting. Pudding. Cookies. Cakes. Pies. Milk chocolate or white chocolate. Buttered syrups. Full-fat ice cream or ice cream drinks. The items listed above may not be a complete list of foods and drinks to avoid. Contact a dietitian for more information. Summary Heart-healthy meal planning includes eating less unhealthy fats, eating more healthy fats,  and making other changes in your diet. Eat a balanced diet. This includes fruits and vegetables, low-fat or nonfat dairy, lean protein, nuts and legumes, whole grains, and heart-healthy oils and fats. This information is not intended to replace advice given to you by your health care provider. Make sure you discuss any questions you have with your health care provider. Document Revised: 07/23/2021 Document Reviewed: 07/23/2021 Elsevier Patient Education  2024 [employer].

## 2023-08-13 NOTE — Assessment & Plan Note (Signed)
Coronary calcium score performed 05/19/2023 was 124, CTA revealed mild nonobstructive CAD.  He is asymptomatic.  We talked about the importance of risk factor modification.

## 2023-08-13 NOTE — Assessment & Plan Note (Signed)
History of mild hyperlipidemia lipid profile performed 04/17/2023 revealed total cholesterol 163, LDL 85 HDL 46.  He was started on statin therapy briefly but he did not tolerate this so he is no longer taking it.  He did have a mildly elevated coronary calcium score of 124 and as a result I would like his LDL to be less than 70.  We have talked about dietary modification/lifestyle modification initially.  Will recheck his lipid liver profile in 3 months.  If he is not at goal we will probably add Zetia.

## 2023-08-19 ENCOUNTER — Telehealth: Payer: Self-pay

## 2023-08-19 MED ORDER — FAMOTIDINE 40 MG PO TABS: 40.0000 mg | ORAL_TABLET | Freq: Every day | ORAL | 3 refills | Status: DC

## 2023-08-19 NOTE — Telephone Encounter (Signed)
 [facility] to refill

## 2023-08-19 NOTE — Telephone Encounter (Signed)
This patient has transferred care to you.  Refill request from pharmacy for Famotidine.  Ok to refill?

## 2023-08-19 NOTE — Telephone Encounter (Signed)
Pepcid refilled

## 2023-08-25 MED ORDER — PANTOPRAZOLE SODIUM 40 MG PO TBEC: 40.0000 mg | DELAYED_RELEASE_TABLET | Freq: Two times a day (BID) | ORAL | 2 refills | Status: AC

## 2023-08-25 NOTE — Telephone Encounter (Signed)
 [facility]. PT advised that [facility] for any further refills.

## 2023-08-25 NOTE — Addendum Note (Signed)
 Addended by: Jeanine Luz on: 08/25/2023 10:48 AM   Modules accepted: Orders

## 2023-08-25 NOTE — Telephone Encounter (Signed)
 Pepcid sent to [facility]

## 2023-08-28 DIAGNOSIS — K080 Exfoliation of teeth due to systemic causes: Secondary | ICD-10-CM | POA: Diagnosis not present

## 2023-08-29 ENCOUNTER — Other Ambulatory Visit: Payer: Self-pay

## 2023-08-29 MED ORDER — FAMOTIDINE 40 MG PO TABS: 40.0000 mg | ORAL_TABLET | Freq: Every day | ORAL | 3 refills | Status: AC

## 2023-11-12 DIAGNOSIS — I10 Essential (primary) hypertension: Secondary | ICD-10-CM | POA: Diagnosis not present

## 2023-11-12 DIAGNOSIS — R931 Abnormal findings on diagnostic imaging of heart and coronary circulation: Secondary | ICD-10-CM | POA: Diagnosis not present

## 2023-11-12 DIAGNOSIS — E785 Hyperlipidemia, unspecified: Secondary | ICD-10-CM | POA: Diagnosis not present

## 2023-11-12 LAB — LIPID PANEL

## 2023-11-13 ENCOUNTER — Ambulatory Visit: Payer: Self-pay | Admitting: Cardiovascular Disease

## 2023-11-13 DIAGNOSIS — E785 Hyperlipidemia, unspecified: Secondary | ICD-10-CM

## 2023-11-13 DIAGNOSIS — R931 Abnormal findings on diagnostic imaging of heart and coronary circulation: Secondary | ICD-10-CM

## 2023-11-13 LAB — HEPATIC FUNCTION PANEL
ALT: 19 IU/L (ref 0–44)
AST: 21 IU/L (ref 0–40)
Albumin: 4.6 g/dL (ref 3.9–4.9)
Alkaline Phosphatase: 114 IU/L (ref 44–121)
Bilirubin Total: 0.6 mg/dL (ref 0.0–1.2)
Bilirubin, Direct: 0.18 mg/dL (ref 0.00–0.40)
Total Protein: 7.6 g/dL (ref 6.0–8.5)

## 2023-11-13 LAB — LIPID PANEL
Cholesterol, Total: 195 mg/dL (ref 100–199)
HDL: 53 mg/dL (ref >39)
LDL CALC COMMENT:: 3.7 ratio (ref 0.0–5.0)
LDL Chol Calc (NIH): 126 mg/dL — ABNORMAL HIGH (ref 0–99)
Triglycerides: 86 mg/dL (ref 0–149)
VLDL Cholesterol Cal: 16 mg/dL (ref 5–40)

## 2023-11-19 DIAGNOSIS — M5416 Radiculopathy, lumbar region: Secondary | ICD-10-CM | POA: Diagnosis not present

## 2023-12-06 DIAGNOSIS — M545 Low back pain, unspecified: Secondary | ICD-10-CM | POA: Diagnosis not present

## 2024-01-09 DIAGNOSIS — M5416 Radiculopathy, lumbar region: Secondary | ICD-10-CM | POA: Diagnosis not present

## 2024-01-15 ENCOUNTER — Ambulatory Visit: Attending: Cardiology | Admitting: Pharmacist

## 2024-01-15 ENCOUNTER — Encounter: Payer: Self-pay | Admitting: Pharmacist

## 2024-01-15 ENCOUNTER — Other Ambulatory Visit (HOSPITAL_COMMUNITY): Payer: Self-pay

## 2024-01-15 DIAGNOSIS — R931 Abnormal findings on diagnostic imaging of heart and coronary circulation: Secondary | ICD-10-CM | POA: Diagnosis not present

## 2024-01-15 DIAGNOSIS — E785 Hyperlipidemia, unspecified: Secondary | ICD-10-CM | POA: Diagnosis not present

## 2024-01-15 MED ORDER — ATORVASTATIN CALCIUM 10 MG PO TABS
10.0000 mg | ORAL_TABLET | Freq: Every day | ORAL | 1 refills | Status: DC
  Filled 2024-01-15: qty 90, 90d supply, fill #0
  Filled 2024-03-26: qty 90, 90d supply, fill #1

## 2024-01-15 NOTE — Progress Notes (Signed)
 Patient ID: [name]                 DOB: February 20, 1951                    MRN: [id number]     HPI: [name] is a 73 y.o. male patient referred to lipid clinic by Dr Court. PMH is significant for HLD, BPH, HTN, and elevated coronary calcium  score.  Presents today with wife to discuss lipid management.  Patient's PCP Dr [name] started patient on rosuvastatin  10mg  on 04/17/23. Patient stopped taking because he felt like his heart was racing when he was laying in bed. Took for approximately one week.  Has also tried OTC supplements such as flaxseed, fish oil and berberine.  Wife reports he has an odd eating schedule and will only sometimes eat once daily. Does not care for vegetables. Drinks wine occasionally.   Current Medications: N/A  Intolerances:  Rosuvastatin   Risk Factors:  Coronary Calcium   LDL goal: <70  Labs: TC 195, Trigs 86, HDL 53, LDL 126 (11/12/23)  Imaging: IMPRESSION: 1. Calcium  score 124 which is 47 th percentile for age/sex    Past Medical History:  Diagnosis Date   Barrett's esophagus 07/2012   Blood transfusion without reported diagnosis 2012   Cataract    Diverticulosis    Erosive esophagitis    GERD (gastroesophageal reflux disease)    GI bleed    S/P laparoscopic cholecystectomy for gangrenous GB complicated by bile leak 06/07/2011   Tubular adenoma of colon 07/2012    Current Outpatient Medications on File Prior to Visit  Medication Sig Dispense Refill   famotidine  (PEPCID ) 40 MG tablet Take 1 tablet (40 mg total) by mouth at bedtime. 90 tablet 3   metoprolol  tartrate (LOPRESSOR ) 100 MG tablet Take 1 tablet (100 mg total) by mouth once for 1 dose. Please take one time dose 100mg  metoprolol  tartrate 2 hr prior to cardiac CT for HR control IF HR >55bpm. 1 tablet 0   pantoprazole  (PROTONIX ) 40 MG tablet Take 1 tablet (40 mg total) by mouth 2 (two) times daily before a meal. 180 tablet 2   rosuvastatin  (CRESTOR ) 10 MG tablet TAKE 1  TABLET BY MOUTH DAILY (Patient not taking: Reported on 08/13/2023) 90 tablet 1   RSV vaccine recomb adjuvanted (AREXVY ) 120 MCG/0.5ML injection Inject into the muscle. 0.5 mL 0   No current facility-administered medications on file prior to visit.    No Known Allergies  Assessment/Plan:  1. Hyperlipidemia - Patient last LDL 124 is above goal of <70. I am not convinced his tachycardia symptoms were caused by rosuvastatin .  Reviewed options for lowering LDL cholesterol, including statins, ezetimibe, PCSK9 inhibitors, Nexletol/Nexlizet, and Leqvio.   Willing to trial a new statin. Will start low dose atorvastatin  10mg  once daily. Check LFTs on 1 month and recheck lipid panel in 3 months. If intolerant to atorvastatin  consider PCSk9i or bempedoic acid at next visit.  Start atorvastatin  10mg  once daily Check LFTs in 4 weeks Check lipid panel in 3 months  Chris Anabel Lykins, PharmD, BCACP, CDCES, CPP [facility] [address], [place], [place] [address] Phone: [phone]; Fax: [phone] 01/15/2024 12:21 PM

## 2024-01-15 NOTE — Patient Instructions (Addendum)
 It was mice meeting you two today  We would like your LDL (bad cholesterol) to be less than 70  We will start a new medication called atorvastatin  10mg  once daily  I have placed an order to check your liver enzymes in a month and a recheck lipid panel in 3 months  Please let us  know if you have any questions  Medford Bolk, PharmD, BCACP, CDCES, CPP [facility] [address] [place], [place] [address] Phone: [phone]; Fax: [phone] 01/15/2024 10:59 AM

## 2024-01-16 ENCOUNTER — Ambulatory Visit: Admitting: Pharmacist Clinician (PhC)/ Clinical Pharmacy Specialist

## 2024-01-30 ENCOUNTER — Encounter: Payer: Self-pay | Admitting: Pharmacist

## 2024-01-30 DIAGNOSIS — E785 Hyperlipidemia, unspecified: Secondary | ICD-10-CM

## 2024-01-30 DIAGNOSIS — R931 Abnormal findings on diagnostic imaging of heart and coronary circulation: Secondary | ICD-10-CM

## 2024-02-17 DIAGNOSIS — R931 Abnormal findings on diagnostic imaging of heart and coronary circulation: Secondary | ICD-10-CM | POA: Diagnosis not present

## 2024-02-17 DIAGNOSIS — E785 Hyperlipidemia, unspecified: Secondary | ICD-10-CM | POA: Diagnosis not present

## 2024-02-18 ENCOUNTER — Ambulatory Visit: Payer: Self-pay | Admitting: Cardiovascular Disease

## 2024-02-18 LAB — LIPID PANEL
Chol/HDL Ratio: 2.5 ratio (ref 0.0–5.0)
Cholesterol, Total: 125 mg/dL (ref 100–199)
HDL: 50 mg/dL (ref >39)
LDL Chol Calc (NIH): 59 mg/dL (ref 0–99)
Triglycerides: 83 mg/dL (ref 0–149)
VLDL Cholesterol Cal: 16 mg/dL (ref 5–40)

## 2024-02-25 DIAGNOSIS — E785 Hyperlipidemia, unspecified: Secondary | ICD-10-CM | POA: Diagnosis not present

## 2024-02-25 DIAGNOSIS — R931 Abnormal findings on diagnostic imaging of heart and coronary circulation: Secondary | ICD-10-CM | POA: Diagnosis not present

## 2024-02-25 LAB — HEPATIC FUNCTION PANEL
ALT: 17 IU/L (ref 0–44)
AST: 18 IU/L (ref 0–40)
Albumin: 4.3 g/dL (ref 3.9–4.9)
Alkaline Phosphatase: 108 IU/L (ref 44–121)
Bilirubin Total: 0.7 mg/dL (ref 0.0–1.2)
Bilirubin, Direct: 0.22 mg/dL (ref 0.00–0.40)
Total Protein: 6.6 g/dL (ref 6.0–8.5)

## 2024-03-16 DIAGNOSIS — K080 Exfoliation of teeth due to systemic causes: Secondary | ICD-10-CM | POA: Diagnosis not present

## 2024-04-14 ENCOUNTER — Ambulatory Visit: Payer: Medicare Other

## 2024-04-17 ENCOUNTER — Ambulatory Visit
Admission: EM | Admit: 2024-04-17 | Discharge: 2024-04-17 | Disposition: A | Discharge status: Home / Self Care | Attending: Family Medicine | Admitting: Family Medicine

## 2024-04-17 DIAGNOSIS — J40 Bronchitis, not specified as acute or chronic: Secondary | ICD-10-CM

## 2024-04-17 DIAGNOSIS — J329 Chronic sinusitis, unspecified: Secondary | ICD-10-CM

## 2024-04-17 MED ORDER — PROMETHAZINE-DM 6.25-15 MG/5ML PO SYRP: 5.0000 mL | ORAL_SOLUTION | Freq: Three times a day (TID) | ORAL | 0 refills | Status: DC | PRN

## 2024-04-17 MED ORDER — AMOXICILLIN-POT CLAVULANATE 875-125 MG PO TABS: 1.0000 | ORAL_TABLET | Freq: Two times a day (BID) | ORAL | 0 refills | Status: DC

## 2024-04-17 MED ORDER — FLUTICASONE PROPIONATE 50 MCG/ACT NA SUSP: 1.0000 | Freq: Every day | NASAL | 0 refills | Status: DC

## 2024-04-17 NOTE — Discharge Instructions (Addendum)
 Start Augmentin twice daily for 7 days.  You may use Flonase daily as well.  Promethazine DM as needed for your cough.  Please note this medication will make you drowsy.  Do not drink alcohol or drive while on this medication.  Lots of rest and fluids.  Please follow-up with your PCP if your symptoms do not improve.  Please go to the ER if you develop any worsening symptoms.  Hope you feel better soon!

## 2024-04-17 NOTE — ED Provider Notes (Signed)
 [facility]    CSN: [id number] Arrival date & time: 04/17/24  1406      History   Chief Complaint Chief Complaint  Patient presents with   Cough    HPI [name] is a 73 y.o. male  presents for evaluation of URI symptoms for 3 weeks. Patient reports associated symptoms of cough, congestion, sinus pressure/pain, sore throat. Denies N/V/D, fevers, ear pain, body aches, shortness of breath. Patient does not have a hx of asthma. Patient is not an active smoker.   Reports no known sick contacts.  Pt has taken aspirin OTC for symptoms. Pt has no other concerns at this time.    Cough Associated symptoms: sore throat     Past Medical History:  Diagnosis Date   Barrett's esophagus 07/2012   Blood transfusion without reported diagnosis 2012   Cataract    Diverticulosis    Erosive esophagitis    GERD (gastroesophageal reflux disease)    GI bleed    S/P laparoscopic cholecystectomy for gangrenous GB complicated by bile leak 06/07/2011   Tubular adenoma of colon 07/2012    Patient Active Problem List   Diagnosis Date Noted   Elevated coronary artery calcium  score 08/13/2023   Precordial chest pain 04/22/2023   Primary hypertension 04/17/2023   Encounter for general adult medical examination with abnormal findings 04/17/2023   BPH with obstruction/lower urinary tract symptoms 09/12/2021   Erectile dysfunction due to arterial insufficiency 09/06/2021   Chronic left shoulder pain 06/22/2019   Rotator cuff disorder, left 01/06/2019   Hyperlipidemia LDL goal <130 05/07/2018   Hematuria, gross 03/20/2017   Screening, heart disease, ischemic 04/15/2014   Gastroesophageal reflux disease without esophagitis 10/08/2011   Barrett esophagus 10/08/2011    Past Surgical History:  Procedure Laterality Date   CHOLECYSTECTOMY     COLONOSCOPY     ESOPHAGOGASTRODUODENOSCOPY  10/08/2011   Procedure: ESOPHAGOGASTRODUODENOSCOPY (EGD);  Surgeon: Gwendlyn ONEIDA Buddy, MD,FACG;   Location: [facility];  Service: Endoscopy;  Laterality: N/A;   HERNIA REPAIR     NISSEN FUNDOPLICATION         Home Medications    Prior to Admission medications   Medication Sig Start Date End Date Taking? Authorizing Provider  amoxicillin-clavulanate (AUGMENTIN) 875-125 MG tablet Take 1 tablet by mouth every 12 (twelve) hours. 04/17/24  Yes Kami Kube, Jodi R, NP  fluticasone (FLONASE) 50 MCG/ACT nasal spray Place 1 spray into both nostrils daily. 04/17/24  Yes Carmelia Tiner, Jodi R, NP  promethazine-dextromethorphan (PROMETHAZINE-DM) 6.25-15 MG/5ML syrup Take 5 mLs by mouth 3 (three) times daily as needed for cough. 04/17/24  Yes Alin Chavira, Jodi R, NP  atorvastatin  (LIPITOR) 10 MG tablet Take 1 tablet (10 mg total) by mouth daily. 01/15/24   Court Dorn PARAS, MD  famotidine  (PEPCID ) 40 MG tablet Take 1 tablet (40 mg total) by mouth at bedtime. 08/29/23   Pyrtle, Gordy HERO, MD  pantoprazole  (PROTONIX ) 40 MG tablet Take 1 tablet (40 mg total) by mouth 2 (two) times daily before a meal. 08/25/23   Pyrtle, Gordy HERO, MD    Family History Family History  Problem Relation Age of Onset   Lung cancer Mother    Heart disease Mother    Pancreatitis Father    Breast cancer Maternal Aunt    Stomach cancer Paternal Uncle    Esophageal cancer Paternal Uncle    Drug abuse Son    Early death Son 74       cocaine OD   Colon cancer Neg  Hx    Rectal cancer Neg Hx    Colon polyps Neg Hx     Social History Social History   Tobacco Use   Smoking status: Never   Smokeless tobacco: Never  Vaping Use   Vaping status: Never Used  Substance Use Topics   Alcohol use: Yes    Comment: Occasional   Drug use: No     Allergies   Patient has no known allergies.   Review of Systems Review of Systems  HENT:  Positive for congestion, sinus pressure, sinus pain and sore throat.   Respiratory:  Positive for cough.      Physical Exam Triage Vital Signs ED Triage Vitals  Encounter Vitals Group     BP 04/17/24  1419 127/89     Girls Systolic BP Percentile --      Girls Diastolic BP Percentile --      Boys Systolic BP Percentile --      Boys Diastolic BP Percentile --      Pulse Rate 04/17/24 1419 91     Resp 04/17/24 1419 15     Temp 04/17/24 1421 98.8 F (37.1 C)     Temp Source 04/17/24 1419 Oral     SpO2 04/17/24 1419 93 %     Weight --      Height --      Head Circumference --      Peak Flow --      Pain Score 04/17/24 1419 5     Pain Loc --      Pain Education --      Exclude from Growth Chart --    No data found.  Updated Vital Signs BP 127/89   Pulse 91   Temp 98.8 F (37.1 C)   Resp 15   SpO2 93%   Visual Acuity Right Eye Distance:   Left Eye Distance:   Bilateral Distance:    Right Eye Near:   Left Eye Near:    Bilateral Near:     Physical Exam Vitals and nursing note reviewed.  Constitutional:      General: He is not in acute distress.    Appearance: Normal appearance. He is not ill-appearing or toxic-appearing.  HENT:     Head: Normocephalic and atraumatic.     Right Ear: Tympanic membrane and ear canal normal.     Left Ear: Tympanic membrane and ear canal normal.     Nose: Congestion present.     Right Turbinates: Pale.     Left Turbinates: Pale.     Mouth/Throat:     Mouth: Mucous membranes are moist.     Pharynx: Postnasal drip present. No oropharyngeal exudate or posterior oropharyngeal erythema.  Eyes:     Pupils: Pupils are equal, round, and reactive to light.  Cardiovascular:     Rate and Rhythm: Normal rate and regular rhythm.     Heart sounds: Normal heart sounds.  Pulmonary:     Effort: Pulmonary effort is normal.     Breath sounds: Normal breath sounds. No wheezing or rhonchi.  Musculoskeletal:     Cervical back: Normal range of motion and neck supple.  Lymphadenopathy:     Cervical: No cervical adenopathy.  Skin:    General: Skin is warm and dry.  Neurological:     General: No focal deficit present.     Mental Status: He is alert  and oriented to person, place, and time.  Psychiatric:  Mood and Affect: Mood normal.        Behavior: Behavior normal.      UC Treatments / Results  Labs (all labs ordered are listed, but only abnormal results are displayed) Labs Reviewed - No data to display  EKG   Radiology No results found.  Procedures Procedures (including critical care time)  Medications Ordered in UC Medications - No data to display  Initial Impression / Assessment and Plan / UC Course  I have reviewed the triage vital signs and the nursing notes.  Pertinent labs & imaging results that were available during my care of the patient were reviewed by me and considered in my medical decision making (see chart for details).     Reviewed exam and symptoms with patient.  No red flags.  He is well-appearing and in no acute distress.  Start Augmentin, Flonase, and Promethazine DM as needed for cough.  Encouraged rest fluids and PCP follow-up if his symptoms are not improving.  ER precautions reviewed. Final Clinical Impressions(s) / UC Diagnoses   Final diagnoses:  Sinobronchitis     Discharge Instructions      Start Augmentin twice daily for 7 days.  You may use Flonase daily as well.  Promethazine DM as needed for your cough.  Please note this medication will make you drowsy.  Do not drink alcohol or drive while on this medication.  Lots of rest and fluids.  Please follow-up with your PCP if your symptoms do not improve.  Please go to the ER if you develop any worsening symptoms.  Hope you feel better soon!    ED Prescriptions     Medication Sig Dispense Auth. Provider   amoxicillin-clavulanate (AUGMENTIN) 875-125 MG tablet Take 1 tablet by mouth every 12 (twelve) hours. 14 tablet Jacqulene Huntley, Jodi R, NP   fluticasone (FLONASE) 50 MCG/ACT nasal spray Place 1 spray into both nostrils daily. 15.8 mL Nyriah Coote, Jodi R, NP   promethazine-dextromethorphan (PROMETHAZINE-DM) 6.25-15 MG/5ML syrup Take 5 mLs by  mouth 3 (three) times daily as needed for cough. 118 mL Guido Comp, Jodi R, NP      PDMP not reviewed this encounter.   Loreda Myla SAUNDERS, NP 04/17/24 1447

## 2024-04-17 NOTE — ED Triage Notes (Addendum)
 Pt present with a sore throat, runny nose and c/o respiratory distress x three weeks. Pt states he has not taken anything for relief at home.

## 2024-04-19 ENCOUNTER — Telehealth: Payer: Self-pay

## 2024-04-19 NOTE — Telephone Encounter (Signed)
 Copied from CRM #[id number]. Topic: Clinical - Medication Question >> Apr 19, 2024 11:06 AM Revonda D wrote: Reason for CRM: Pt stated that he was seen in the UC on 10/18 due to a cough and having phlegm. Pt stated he was prescribed promethazine-dextromethorphan(PROMETHAZINE-DM) 6.25-15 MG/5ML syrup but stated that it makes him dizzy. Pt would like to see if Dr.Jones could prescribe the cough syrup he prescribed in the past. Pt would like a callback with an update.

## 2024-04-19 NOTE — Telephone Encounter (Signed)
**Note De-identified  [place] Obfuscation** Please advise 

## 2024-04-19 NOTE — Telephone Encounter (Signed)
 Patient has been made aware he was very upset that Dr. Joshua wouldn't send the medication in for him. He states he doesn't understand why patients can't get what they want and need when the want and need it. I reiterated that he hasn't been seen in a year and with that type of medication Dr. Joshua cannot just send it in.I offered to schedule him an acute visit for this issue so that he could get the medication. He denied the appointment and advised me to let Dr. Joshua know that he was very disappointment. I gave him a verbal understanding and we disconnected the call.

## 2024-04-19 NOTE — Telephone Encounter (Signed)
 I have not seem him in a year

## 2024-04-22 DIAGNOSIS — H524 Presbyopia: Secondary | ICD-10-CM | POA: Diagnosis not present

## 2024-04-26 ENCOUNTER — Ambulatory Visit

## 2024-05-10 ENCOUNTER — Encounter: Admitting: Internal Medicine

## 2024-06-01 ENCOUNTER — Encounter: Payer: Self-pay | Admitting: Pharmacist

## 2024-06-15 ENCOUNTER — Other Ambulatory Visit (HOSPITAL_COMMUNITY): Payer: Self-pay

## 2024-06-15 MED ORDER — PRAVASTATIN SODIUM 10 MG PO TABS
10.0000 mg | ORAL_TABLET | Freq: Every day | ORAL | 1 refills | Status: AC
  Filled 2024-06-15: qty 90, 90d supply, fill #0

## 2024-06-15 NOTE — Addendum Note (Signed)
 Addended by: DARRELL BRUCKNER on: 06/15/2024 04:08 PM   Modules accepted: Orders

## 2024-06-25 ENCOUNTER — Other Ambulatory Visit (HOSPITAL_BASED_OUTPATIENT_CLINIC_OR_DEPARTMENT_OTHER): Payer: Self-pay

## 2024-06-25 MED ORDER — FLUZONE HIGH-DOSE 0.5 ML IM SUSY
0.5000 mL | PREFILLED_SYRINGE | Freq: Once | INTRAMUSCULAR | 0 refills | Status: AC
  Filled 2024-06-25: qty 0.5, 1d supply, fill #0

## 2024-06-25 MED ORDER — COMIRNATY 30 MCG/0.3ML IM SUSY
0.3000 mL | PREFILLED_SYRINGE | Freq: Once | INTRAMUSCULAR | 0 refills | Status: AC
  Filled 2024-06-25: qty 0.3, 1d supply, fill #0

## 2024-06-29 ENCOUNTER — Encounter: Payer: Self-pay | Admitting: Internal Medicine

## 2024-06-29 ENCOUNTER — Ambulatory Visit (INDEPENDENT_AMBULATORY_CARE_PROVIDER_SITE_OTHER)

## 2024-06-29 ENCOUNTER — Ambulatory Visit: Payer: Self-pay | Admitting: Internal Medicine

## 2024-06-29 ENCOUNTER — Ambulatory Visit: Admitting: Internal Medicine

## 2024-06-29 ENCOUNTER — Other Ambulatory Visit: Payer: Self-pay

## 2024-06-29 VITALS — BP 132/86 | HR 71 | Temp 98.1°F | Resp 16 | Ht 68.5 in | Wt 194.4 lb

## 2024-06-29 VITALS — BP 122/72 | HR 71 | Ht 68.5 in | Wt 194.4 lb

## 2024-06-29 DIAGNOSIS — I10 Essential (primary) hypertension: Secondary | ICD-10-CM | POA: Diagnosis not present

## 2024-06-29 DIAGNOSIS — E785 Hyperlipidemia, unspecified: Secondary | ICD-10-CM

## 2024-06-29 DIAGNOSIS — Z0001 Encounter for general adult medical examination with abnormal findings: Secondary | ICD-10-CM

## 2024-06-29 DIAGNOSIS — Z0000 Encounter for general adult medical examination without abnormal findings: Secondary | ICD-10-CM

## 2024-06-29 DIAGNOSIS — N138 Other obstructive and reflux uropathy: Secondary | ICD-10-CM

## 2024-06-29 DIAGNOSIS — K219 Gastro-esophageal reflux disease without esophagitis: Secondary | ICD-10-CM

## 2024-06-29 DIAGNOSIS — N401 Enlarged prostate with lower urinary tract symptoms: Secondary | ICD-10-CM

## 2024-06-29 LAB — CBC WITH DIFFERENTIAL/PLATELET
Basophils Absolute: 0.1 K/uL (ref 0.0–0.1)
Basophils Relative: 1 % (ref 0.0–3.0)
Eosinophils Absolute: 0.2 K/uL (ref 0.0–0.7)
Eosinophils Relative: 2.5 % (ref 0.0–5.0)
HCT: 47.7 % (ref 39.0–52.0)
Hemoglobin: 16.1 g/dL (ref 13.0–17.0)
Lymphocytes Relative: 34.1 % (ref 12.0–46.0)
Lymphs Abs: 2.3 K/uL (ref 0.7–4.0)
MCHC: 33.7 g/dL (ref 30.0–36.0)
MCV: 96.1 fl (ref 78.0–100.0)
Monocytes Absolute: 0.7 K/uL (ref 0.1–1.0)
Monocytes Relative: 10.3 % (ref 3.0–12.0)
Neutro Abs: 3.5 K/uL (ref 1.4–7.7)
Neutrophils Relative %: 52.1 % (ref 43.0–77.0)
Platelets: 244 K/uL (ref 150.0–400.0)
RBC: 4.97 Mil/uL (ref 4.22–5.81)
RDW: 13.1 % (ref 11.5–15.5)
WBC: 6.6 K/uL (ref 4.0–10.5)

## 2024-06-29 LAB — TSH: TSH: 2.42 u[IU]/mL (ref 0.35–5.50)

## 2024-06-29 LAB — PSA: PSA: 0.82 ng/mL (ref 0.10–4.00)

## 2024-06-29 LAB — URINALYSIS, ROUTINE W REFLEX MICROSCOPIC
Bilirubin Urine: NEGATIVE
Hgb urine dipstick: NEGATIVE
Ketones, ur: NEGATIVE
Leukocytes,Ua: NEGATIVE
Nitrite: NEGATIVE
RBC / HPF: NONE SEEN
Specific Gravity, Urine: 1.02 (ref 1.000–1.030)
Total Protein, Urine: NEGATIVE
Urine Glucose: NEGATIVE
Urobilinogen, UA: 0.2 (ref 0.0–1.0)
pH: 6 (ref 5.0–8.0)

## 2024-06-29 LAB — BASIC METABOLIC PANEL WITH GFR
BUN: 17 mg/dL (ref 6–23)
CO2: 29 meq/L (ref 19–32)
Calcium: 9.3 mg/dL (ref 8.4–10.5)
Chloride: 101 meq/L (ref 96–112)
Creatinine, Ser: 1.13 mg/dL (ref 0.40–1.50)
GFR: 65.51 mL/min
Glucose, Bld: 88 mg/dL (ref 70–99)
Potassium: 4.6 meq/L (ref 3.5–5.1)
Sodium: 137 meq/L (ref 135–145)

## 2024-06-29 NOTE — Progress Notes (Signed)
 "  Chief Complaint  Patient presents with   Medicare Wellness     Subjective:   [name] is a 73 y.o. male who presents for a Medicare Annual Wellness Visit.  Visit info / Clinical Intake: Medicare Wellness Visit Type:: Subsequent Annual Wellness Visit Persons participating in visit and providing information:: patient Medicare Wellness Visit Mode:: In-person (required for WTM) Interpreter Needed?: No Pre-visit prep was completed: yes AWV questionnaire completed by patient prior to visit?: no Living arrangements:: lives with spouse/significant other Patient's Overall Health Status Rating: good Typical amount of pain: none Does pain affect daily life?: no Are you currently prescribed opioids?: no  Dietary Habits and Nutritional Risks How many meals a day?: 2 Eats fruit and vegetables daily?: yes Most meals are obtained by: preparing own meals; eating out In the last 2 weeks, have you had any of the following?: none Diabetic:: no  Functional Status Activities of Daily Living (to include ambulation/medication): Independent Ambulation: Independent with device- listed below Home Assistive Devices/Equipment: Eyeglasses Medication Administration: Independent Home Management (perform basic housework or laundry): Independent Manage your own finances?: yes Primary transportation is: driving Concerns about vision?: no *vision screening is required for WTM* Concerns about hearing?: no  Fall Screening Falls in the past year?: 0 Number of falls in past year: 0 Was there an injury with Fall?: 0 Fall Risk Category Calculator: 0 Patient Fall Risk Level: Low Fall Risk  Fall Risk Patient at Risk for Falls Due to: No Fall Risks Fall risk Follow up: Falls evaluation completed; Falls prevention discussed  Home and Transportation Safety: All rugs have non-skid backing?: N/A, no rugs All stairs or steps have railings?: yes Grab bars in the bathtub or shower?: (!) no Have  non-skid surface in bathtub or shower?: yes Good home lighting?: yes Regular seat belt use?: yes Hospital stays in the last year:: no  Cognitive Assessment Difficulty concentrating, remembering, or making decisions? : no Will 6CIT or Mini Cog be Completed: yes What year is it?: 0 points What month is it?: 0 points Give patient an address phrase to remember (5 components): [address] [place], [place] About what time is it?: 0 points Count backwards from 20 to 1: 0 points Say the months of the year in reverse: 0 points Repeat the address phrase from earlier: 0 points 6 CIT Score: 0 points  Advance Directives (For Healthcare) Does Patient Have a Medical Advance Directive?: No Would patient like information on creating a medical advance directive?: Yes (MAU/Ambulatory/Procedural Areas - Information given)  Reviewed/Updated  Reviewed/Updated: Reviewed All (Medical, Surgical, Family, Medications, Allergies, Care Teams, Patient Goals)    Allergies (verified) Patient has no known allergies.   Current Medications (verified) Outpatient Encounter Medications as of 06/29/2024  Medication Sig   famotidine  (PEPCID ) 40 MG tablet Take 1 tablet (40 mg total) by mouth at bedtime.   pantoprazole  (PROTONIX ) 40 MG tablet Take 1 tablet (40 mg total) by mouth 2 (two) times daily before a meal.   pravastatin  (PRAVACHOL ) 10 MG tablet Take 1 tablet (10 mg total) by mouth daily.   amoxicillin -clavulanate (AUGMENTIN ) 875-125 MG tablet Take 1 tablet by mouth every 12 (twelve) hours. (Patient not taking: Reported on 06/29/2024)   fluticasone  (FLONASE ) 50 MCG/ACT nasal spray Place 1 spray into both nostrils daily. (Patient not taking: Reported on 06/29/2024)   promethazine -dextromethorphan (PROMETHAZINE -DM) 6.25-15 MG/5ML syrup Take 5 mLs by mouth 3 (three) times daily as needed for cough. (Patient not taking: Reported on 06/29/2024)   No facility-administered  encounter medications on file as of 06/29/2024.     History: Past Medical History:  Diagnosis Date   Barrett's esophagus 07/2012   Blood transfusion without reported diagnosis 2012   Cataract    Diverticulosis    Erosive esophagitis    GERD (gastroesophageal reflux disease)    GI bleed    S/P laparoscopic cholecystectomy for gangrenous GB complicated by bile leak 06/07/2011   Tubular adenoma of colon 07/2012   Past Surgical History:  Procedure Laterality Date   CHOLECYSTECTOMY     COLONOSCOPY     ESOPHAGOGASTRODUODENOSCOPY  10/08/2011   Procedure: ESOPHAGOGASTRODUODENOSCOPY (EGD);  Surgeon: Gwendlyn ONEIDA Buddy, MD,FACG;  Location: [facility];  Service: Endoscopy;  Laterality: N/A;   HERNIA REPAIR     NISSEN FUNDOPLICATION     Family History  Problem Relation Age of Onset   Lung cancer Mother    Heart disease Mother    Pancreatitis Father    Breast cancer Maternal Aunt    Stomach cancer Paternal Uncle    Esophageal cancer Paternal Uncle    Drug abuse Son    Early death Son 43       cocaine OD   Colon cancer Neg Hx    Rectal cancer Neg Hx    Colon polyps Neg Hx    Social History   Occupational History   Not on file  Tobacco Use   Smoking status: Never   Smokeless tobacco: Never  Vaping Use   Vaping status: Never Used  Substance and Sexual Activity   Alcohol use: Yes    Alcohol/week: 2.0 standard drinks of alcohol    Types: 1 Glasses of wine, 1 Cans of beer per week    Comment: Occasional   Drug use: No   Sexual activity: Yes   Tobacco Counseling Counseling given: No  SDOH Screenings   Food Insecurity: No Food Insecurity (06/29/2024)  Housing: Unknown (06/29/2024)  Transportation Needs: No Transportation Needs (06/29/2024)  Utilities: Not At Risk (06/29/2024)  Alcohol Screen: Low Risk (04/14/2023)  Depression (PHQ2-9): Low Risk (06/29/2024)  Financial Resource Strain: Low Risk (04/14/2023)  Physical Activity: Inactive (06/29/2024)  Social Connections: Moderately Integrated (06/29/2024)  Stress: No Stress  Concern Present (06/29/2024)  Tobacco Use: Low Risk (06/29/2024)  Health Literacy: Adequate Health Literacy (06/29/2024)   See flowsheets for full screening details  Depression Screen PHQ 2 & 9 Depression Scale- Over the past 2 weeks, how often have you been bothered by any of the following problems? Little interest or pleasure in doing things: 0 Feeling down, depressed, or hopeless (PHQ Adolescent also includes...irritable): 0 PHQ-2 Total Score: 0 Trouble falling or staying asleep, or sleeping too much: 0 Feeling tired or having little energy: 0 Poor appetite or overeating (PHQ Adolescent also includes...weight loss): 0 Feeling bad about yourself - or that you are a failure or have let yourself or your family down: 0 Trouble concentrating on things, such as reading the newspaper or watching television (PHQ Adolescent also includes...like school work): 0 Moving or speaking so slowly that other people could have noticed. Or the opposite - being so fidgety or restless that you have been moving around a lot more than usual: 0 Thoughts that you would be better off dead, or of hurting yourself in some way: 0 PHQ-9 Total Score: 0 If you checked off any problems, how difficult have these problems made it for you to do your work, take care of things at home, or get along with other people?: Not difficult at all  Depression Treatment Depression Interventions/Treatment : EYV7-0 Score <4 Follow-up Not Indicated     Goals Addressed               This Visit's Progress     Patient Stated (pt-stated)        Patient stated he plans to continue traveling and taking meds daily.  Plans to get a gym membership with [employer] in 2026             Objective:    Today's Vitals   06/29/24 0936  BP: 122/72  Pulse: 71  SpO2: 98%  Weight: 194 lb 6.4 oz (88.2 kg)  Height: 5' 8.5 (1.74 m)   Body mass index is 29.13 kg/m.  Hearing/Vision screen Hearing Screening - Comments:: C/o hearing  concerns - declines Audiology referral Vision Screening - Comments:: Wears eyeglasses for reading - up to date with routine eye exams with Ozell Bertin Immunizations and Health Maintenance Health Maintenance  Topic Date Due   COVID-19 Vaccine (10 - 2025-26 season) 12/24/2024   DTaP/Tdap/Td (2 - Td or Tdap) 04/17/2025   Medicare Annual Wellness (AWV)  06/29/2025   Colonoscopy  01/27/2026   Pneumococcal Vaccine: 50+ Years  Completed   Influenza Vaccine  Completed   Hepatitis C Screening  Completed   Zoster Vaccines- Shingrix   Completed   Meningococcal B Vaccine  Aged Out        Assessment/Plan:  This is a routine wellness examination for [name].  Patient Care Team: Joshua Debby CROME, MD as PCP - General (Internal Medicine) Bertin Ozell, MD as Consulting Physician (Ophthalmology) Court Dorn PARAS, MD as Consulting Physician (Cardiology)  I have personally reviewed and noted the following in the patients chart:   Medical and social history Use of alcohol, tobacco or illicit drugs  Current medications and supplements including opioid prescriptions. Functional ability and status Nutritional status Physical activity Advanced directives List of other physicians Hospitalizations, surgeries, and ER visits in previous 12 months Vitals Screenings to include cognitive, depression, and falls Referrals and appointments  No orders of the defined types were placed in this encounter.  In addition, I have reviewed and discussed with patient certain preventive protocols, quality metrics, and best practice recommendations. A written personalized care plan for preventive services as well as general preventive health recommendations were provided to patient.   [name], CMA   06/29/2024   Return in 1 year (on 06/29/2025).  After Visit Summary: (In Person-Declined) Patient declined AVS at this time.  Nurse Notes: Appointment(s) made: (scheduled 2026 AWV/CPE appts)  "

## 2024-06-29 NOTE — Progress Notes (Signed)
 "  Subjective:  Patient ID: [name], male    DOB: [birth year]  Age: 73 y.o. MRN: [id number]  CC: Annual Exam, Hypertension, Hyperlipidemia, and Gastroesophageal Reflux   HPI [name] presents for a CPX and f/up ---  Discussed the use of AI scribe software for clinical note transcription with the patient, who gave verbal consent to proceed.  History of Present Illness [name] is a 73 year old male who presents for a routine follow-up visit.  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 No chest pain, shortness of breath, dizziness, or lightheadedness during physical activity.  He has a history of hyperlipidemia and was previously on Lipitor, which caused cold-like symptoms. He switched to pravastatin , which he tolerates well, though he experiences mild joint discomfort in his fingers. This discomfort was more pronounced with Lipitor.  He experiences nocturia, urinating approximately four times per night. He was previously prescribed Cialis  for this issue but did not tolerate it well and has since discontinued its use.     Outpatient Medications Prior to Visit  Medication Sig Dispense Refill   famotidine  (PEPCID ) 40 MG tablet Take 1 tablet (40 mg total) by mouth at bedtime. 90 tablet 3   pantoprazole  (PROTONIX ) 40 MG tablet Take 1 tablet (40 mg total) by mouth 2 (two) times daily before a meal. 180 tablet 2   pravastatin  (PRAVACHOL ) 10 MG tablet Take 1 tablet (10 mg total) by mouth daily. 90 tablet 1   amoxicillin -clavulanate (AUGMENTIN ) 875-125 MG tablet Take 1 tablet by mouth every 12 (twelve) hours. (Patient not taking: Reported on 06/29/2024) 14 tablet 0   fluticasone  (FLONASE ) 50 MCG/ACT nasal spray Place 1 spray into both nostrils daily. (Patient not taking: Reported on 06/29/2024) 15.8 mL 0   promethazine -dextromethorphan (PROMETHAZINE -DM) 6.25-15 MG/5ML syrup Take 5 mLs by mouth 3  (three) times daily as needed for cough. (Patient not taking: Reported on 06/29/2024) 118 mL 0   No facility-administered medications prior to visit.    ROS Review of Systems  Constitutional:  Negative for appetite change, chills, diaphoresis, fatigue and fever.  HENT: Negative.    Eyes: Negative.   Respiratory: Negative.  Negative for cough, chest tightness, shortness of breath and wheezing.   Cardiovascular:  Negative for chest pain, palpitations and leg swelling.  Gastrointestinal: Negative.  Negative for abdominal pain, constipation, diarrhea, nausea and vomiting.  Endocrine: Negative.   Genitourinary: Negative.  Negative for difficulty urinating, dysuria, frequency and hematuria.  Musculoskeletal:  Negative for arthralgias, joint swelling and myalgias.  Skin: Negative.   Neurological:  Negative for dizziness, weakness and light-headedness.  Hematological:  Negative for adenopathy. Does not bruise/bleed easily.  Psychiatric/Behavioral: Negative.      Objective:  BP 132/86 (BP Location: Left Arm, Patient Position: Sitting, Cuff Size: Small)   Pulse 71   Temp 98.1 F (36.7 C) (Oral)   Resp 16   Ht 5' 8.5 (1.74 m)   Wt 194 lb 6.4 oz (88.2 kg)   SpO2 98%   BMI 29.13 kg/m   BP Readings from Last 3 Encounters:  06/29/24 132/86  06/29/24 122/72  04/17/24 127/89    Wt Readings from Last 3 Encounters:  06/29/24 194 lb 6.4 oz (88.2 kg)  06/29/24 194 lb 6.4 oz (88.2 kg)  08/13/23 197 lb 12.8 oz (89.7 kg)    Physical Exam Vitals reviewed.  Constitutional:      Appearance: Normal appearance.  HENT:  Nose: Nose normal.     Mouth/Throat:     Mouth: Mucous membranes are moist.  Eyes:     General: No scleral icterus.    Conjunctiva/sclera: Conjunctivae normal.  Cardiovascular:     Rate and Rhythm: Normal rate and regular rhythm.     Heart sounds: Normal heart sounds, S1 normal and S2 normal. No murmur heard.    No gallop.     Comments: EKG-- NSR, 69 bpm No LVH, Q  waves, or ST/T wave changes  Pulmonary:     Effort: Pulmonary effort is normal.     Breath sounds: No stridor. No wheezing, rhonchi or rales.  Abdominal:     General: Abdomen is flat.     Palpations: There is no mass.     Tenderness: There is no abdominal tenderness. There is no guarding.     Hernia: No hernia is present. There is no hernia in the left inguinal area or right inguinal area.  Genitourinary:    Pubic Area: No rash.      Penis: Normal.      Testes: Normal.     Epididymis:     Right: Normal.     Left: Normal.     Prostate: Enlarged. Not tender and no nodules present.     Rectum: Normal. Guaiac result negative. No mass, tenderness, anal fissure, external hemorrhoid or internal hemorrhoid. Normal anal tone.  Musculoskeletal:     Cervical back: Neck supple.     Right lower leg: No edema.     Left lower leg: No edema.  Lymphadenopathy:     Cervical: No cervical adenopathy.     Lower Body: No right inguinal adenopathy. No left inguinal adenopathy.  Skin:    General: Skin is warm.  Neurological:     General: No focal deficit present.     Mental Status: He is alert.  Psychiatric:        Mood and Affect: Mood normal.        Behavior: Behavior normal.     Lab Results  Component Value Date   WBC 6.6 06/29/2024   HGB 16.1 06/29/2024   HCT 47.7 06/29/2024   PLT 244.0 06/29/2024   GLUCOSE 88 06/29/2024   CHOL 125 02/17/2024   TRIG 83 02/17/2024   HDL 50 02/17/2024   LDLCALC 59 02/17/2024   ALT 17 02/25/2024   AST 18 02/25/2024   NA 137 06/29/2024   K 4.6 06/29/2024   CL 101 06/29/2024   CREATININE 1.13 06/29/2024   BUN 17 06/29/2024   CO2 29 06/29/2024   TSH 2.42 06/29/2024   PSA 0.82 06/29/2024   INR 1.1 (H) 03/20/2017   HGBA1C 4.9 10/08/2011    No results found.  Assessment & Plan:   Hyperlipidemia LDL goal <130 -     TSH; Future  Primary hypertension- BP is well controlled. EKG is negative for LVH. -     EKG 12-Lead -     Basic metabolic panel  with GFR; Future -     PSA; Future  BPH with obstruction/lower urinary tract symptoms -     Urinalysis, Routine w reflex microscopic; Future -     PSA; Future  Gastroesophageal reflux disease without esophagitis -     Basic metabolic panel with GFR; Future -     CBC with Differential/Platelet; Future  Encounter for general adult medical examination with abnormal findings- Exam completed, labs reviewed, vaccines reviewed, cancer screenings addressed, pt ed material was given.  Follow-up: Return in about 6 months (around 12/28/2024).  Debby Molt, MD "

## 2024-06-29 NOTE — Patient Instructions (Signed)
 Mr. Pannone,  Thank you for taking the time for your Medicare Wellness Visit. I appreciate your continued commitment to your health goals. Please review the care plan we discussed, and feel free to reach out if I can assist you further.  Please note that Annual Wellness Visits do not include a physical exam. Some assessments may be limited, especially if the visit was conducted virtually. If needed, we may recommend an in-person follow-up with your provider.  Ongoing Care Seeing your primary care provider every 3 to 6 months helps us  monitor your health and provide consistent, personalized care.   Referrals If a referral was made during today's visit and you haven't received any updates within two weeks, please contact the referred provider directly to check on the status.  Recommended Screenings:  Health Maintenance  Topic Date Due   COVID-19 Vaccine (10 - 2025-26 season) 12/24/2024   DTaP/Tdap/Td vaccine (2 - Td or Tdap) 04/17/2025   Medicare Annual Wellness Visit  06/29/2025   Colon Cancer Screening  01/27/2026   Pneumococcal Vaccine for age over 59  Completed   Flu Shot  Completed   Hepatitis C Screening  Completed   Zoster (Shingles) Vaccine  Completed   Meningitis B Vaccine  Aged Out       04/14/2023    3:05 PM  Advanced Directives  Does Patient Have a Medical Advance Directive? No  Would patient like information on creating a medical advance directive? No - Patient declined    Vision: Annual vision screenings are recommended for early detection of glaucoma, cataracts, and diabetic retinopathy. These exams can also reveal signs of chronic conditions such as diabetes and high blood pressure.  Dental: Annual dental screenings help detect early signs of oral cancer, gum disease, and other conditions linked to overall health, including heart disease and diabetes.

## 2024-06-29 NOTE — Patient Instructions (Signed)
 Health Maintenance, Male  Adopting a healthy lifestyle and getting preventive care are important in promoting health and wellness. Ask your health care provider about:  The right schedule for you to have regular tests and exams.  Things you can do on your own to prevent diseases and keep yourself healthy.  What should I know about diet, weight, and exercise?  Eat a healthy diet    Eat a diet that includes plenty of vegetables, fruits, low-fat dairy products, and lean protein.  Do not eat a lot of foods that are high in solid fats, added sugars, or sodium.  Maintain a healthy weight  Body mass index (BMI) is a measurement that can be used to identify possible weight problems. It estimates body fat based on height and weight. Your health care provider can help determine your BMI and help you achieve or maintain a healthy weight.  Get regular exercise  Get regular exercise. This is one of the most important things you can do for your health. Most adults should:  Exercise for at least 150 minutes each week. The exercise should increase your heart rate and make you sweat (moderate-intensity exercise).  Do strengthening exercises at least twice a week. This is in addition to the moderate-intensity exercise.  Spend less time sitting. Even light physical activity can be beneficial.  Watch cholesterol and blood lipids  Have your blood tested for lipids and cholesterol at 71 years of age, then have this test every 5 years.  You may need to have your cholesterol levels checked more often if:  Your lipid or cholesterol levels are high.  You are older than 71 years of age.  You are at high risk for heart disease.  What should I know about cancer screening?  Many types of cancers can be detected early and may often be prevented. Depending on your health history and family history, you may need to have cancer screening at various ages. This may include screening for:  Colorectal cancer.  Prostate cancer.  Skin cancer.  Lung  cancer.  What should I know about heart disease, diabetes, and high blood pressure?  Blood pressure and heart disease  High blood pressure causes heart disease and increases the risk of stroke. This is more likely to develop in people who have high blood pressure readings or are overweight.  Talk with your health care provider about your target blood pressure readings.  Have your blood pressure checked:  Every 3-5 years if you are 24-52 years of age.  Every year if you are 3 years old or older.  If you are between the ages of 60 and 72 and are a current or former smoker, ask your health care provider if you should have a one-time screening for abdominal aortic aneurysm (AAA).  Diabetes  Have regular diabetes screenings. This checks your fasting blood sugar level. Have the screening done:  Once every three years after age 66 if you are at a normal weight and have a low risk for diabetes.  More often and at a younger age if you are overweight or have a high risk for diabetes.  What should I know about preventing infection?  Hepatitis B  If you have a higher risk for hepatitis B, you should be screened for this virus. Talk with your health care provider to find out if you are at risk for hepatitis B infection.  Hepatitis C  Blood testing is recommended for:  Everyone born from [birth year] through 1963.  Anyone  with known risk factors for hepatitis C.  Sexually transmitted infections (STIs)  You should be screened each year for STIs, including gonorrhea and chlamydia, if:  You are sexually active and are younger than 71 years of age.  You are older than 71 years of age and your health care provider tells you that you are at risk for this type of infection.  Your sexual activity has changed since you were last screened, and you are at increased risk for chlamydia or gonorrhea. Ask your health care provider if you are at risk.  Ask your health care provider about whether you are at high risk for HIV. Your health care provider  may recommend a prescription medicine to help prevent HIV infection. If you choose to take medicine to prevent HIV, you should first get tested for HIV. You should then be tested every 3 months for as long as you are taking the medicine.  Follow these instructions at home:  Alcohol use  Do not drink alcohol if your health care provider tells you not to drink.  If you drink alcohol:  Limit how much you have to 0-2 drinks a day.  Know how much alcohol is in your drink. In the [place] one drink equals one 12 oz bottle of beer (355 mL), one 5 oz glass of wine (148 mL), or one 1 oz glass of hard liquor (44 mL).  Lifestyle  Do not use any products that contain nicotine or tobacco. These products include cigarettes, chewing tobacco, and vaping devices, such as e-cigarettes. If you need help quitting, ask your health care provider.  Do not use street drugs.  Do not share needles.  Ask your health care provider for help if you need support or information about quitting drugs.  General instructions  Schedule regular health, dental, and eye exams.  Stay current with your vaccines.  Tell your health care provider if:  You often feel depressed.  You have ever been abused or do not feel safe at home.  Summary  Adopting a healthy lifestyle and getting preventive care are important in promoting health and wellness.  Follow your health care provider's instructions about healthy diet, exercising, and getting tested or screened for diseases.  Follow your health care provider's instructions on monitoring your cholesterol and blood pressure.  This information is not intended to replace advice given to you by your health care provider. Make sure you discuss any questions you have with your health care provider.  Document Revised: 11/06/2020 Document Reviewed: 11/06/2020  Elsevier Patient Education  2024 [employer].

## 2024-08-06 ENCOUNTER — Other Ambulatory Visit: Payer: Self-pay | Admitting: Internal Medicine

## 2025-07-05 ENCOUNTER — Ambulatory Visit

## 2025-07-05 ENCOUNTER — Encounter: Admitting: Internal Medicine
